# Patient Record
Sex: Female | Born: 1970 | Race: White | Hispanic: No | Marital: Single | Smoking: Never smoker
Health system: Southern US, Community
[De-identification: ages and names within clinical notes are randomized; demographics above are authoritative.]

## PROBLEM LIST (undated history)

## (undated) DIAGNOSIS — F419 Anxiety disorder, unspecified: Secondary | ICD-10-CM

## (undated) DIAGNOSIS — E119 Type 2 diabetes mellitus without complications: Secondary | ICD-10-CM

## (undated) DIAGNOSIS — I10 Essential (primary) hypertension: Secondary | ICD-10-CM

## (undated) DIAGNOSIS — F319 Bipolar disorder, unspecified: Secondary | ICD-10-CM

## (undated) DIAGNOSIS — T7840XA Allergy, unspecified, initial encounter: Secondary | ICD-10-CM

## (undated) HISTORY — DX: Allergy, unspecified, initial encounter: T78.40XA

## (undated) HISTORY — DX: Anxiety disorder, unspecified: F41.9

## (undated) HISTORY — DX: Essential (primary) hypertension: I10

---

## 2005-04-13 ENCOUNTER — Ambulatory Visit: Payer: Self-pay | Admitting: Internal Medicine

## 2005-05-23 ENCOUNTER — Ambulatory Visit: Payer: Self-pay | Admitting: Family Medicine

## 2005-06-13 ENCOUNTER — Ambulatory Visit: Payer: Self-pay | Admitting: Internal Medicine

## 2005-06-16 ENCOUNTER — Ambulatory Visit: Payer: Self-pay | Admitting: *Deleted

## 2005-06-21 ENCOUNTER — Ambulatory Visit: Payer: Self-pay | Admitting: Internal Medicine

## 2005-07-04 ENCOUNTER — Ambulatory Visit: Payer: Self-pay | Admitting: Internal Medicine

## 2005-07-12 ENCOUNTER — Ambulatory Visit: Payer: Self-pay | Admitting: Internal Medicine

## 2005-07-27 ENCOUNTER — Ambulatory Visit: Payer: Self-pay | Admitting: Internal Medicine

## 2005-08-24 ENCOUNTER — Ambulatory Visit: Payer: Self-pay | Admitting: Internal Medicine

## 2005-10-26 ENCOUNTER — Ambulatory Visit: Payer: Self-pay | Admitting: Internal Medicine

## 2005-11-11 ENCOUNTER — Ambulatory Visit: Payer: Self-pay | Admitting: Internal Medicine

## 2005-11-25 ENCOUNTER — Ambulatory Visit: Payer: Self-pay | Admitting: Internal Medicine

## 2006-01-04 ENCOUNTER — Ambulatory Visit: Payer: Self-pay | Admitting: Internal Medicine

## 2006-03-20 ENCOUNTER — Ambulatory Visit: Payer: Self-pay | Admitting: Internal Medicine

## 2006-04-19 ENCOUNTER — Ambulatory Visit: Payer: Self-pay | Admitting: Family Medicine

## 2006-04-28 ENCOUNTER — Ambulatory Visit: Payer: Self-pay | Admitting: Internal Medicine

## 2006-09-06 ENCOUNTER — Ambulatory Visit: Payer: Self-pay | Admitting: Internal Medicine

## 2006-12-30 ENCOUNTER — Emergency Department (HOSPITAL_COMMUNITY): Admission: EM | Admit: 2006-12-30 | Discharge: 2006-12-30 | Payer: Self-pay | Admitting: Emergency Medicine

## 2007-01-05 ENCOUNTER — Ambulatory Visit: Payer: Self-pay | Admitting: Internal Medicine

## 2007-04-03 ENCOUNTER — Ambulatory Visit: Payer: Self-pay | Admitting: Internal Medicine

## 2007-04-03 ENCOUNTER — Encounter: Payer: Self-pay | Admitting: Internal Medicine

## 2007-09-21 ENCOUNTER — Ambulatory Visit: Payer: Self-pay | Admitting: Internal Medicine

## 2008-03-27 ENCOUNTER — Ambulatory Visit: Payer: Self-pay | Admitting: Internal Medicine

## 2008-08-18 ENCOUNTER — Ambulatory Visit: Payer: Self-pay | Admitting: Internal Medicine

## 2008-09-11 ENCOUNTER — Ambulatory Visit: Payer: Self-pay | Admitting: Internal Medicine

## 2008-12-24 ENCOUNTER — Ambulatory Visit: Payer: Self-pay | Admitting: Internal Medicine

## 2008-12-24 LAB — CONVERTED CEMR LAB
Basophils Absolute: 0 10*3/uL (ref 0.0–0.1)
Basophils Relative: 0 % (ref 0–1)
CO2: 17 meq/L — ABNORMAL LOW (ref 19–32)
Chloride: 105 meq/L (ref 96–112)
Cholesterol: 194 mg/dL (ref 0–200)
HCT: 45.5 % (ref 36.0–46.0)
HDL: 56 mg/dL (ref >39)
Hemoglobin: 15 g/dL (ref 12.0–15.0)
Lymphocytes Relative: 24 % (ref 12–46)
MCHC: 33 g/dL (ref 30.0–36.0)
MCV: 92.5 fL (ref 78.0–100.0)
Neutro Abs: 4.8 10*3/uL (ref 1.7–7.7)
RBC: 4.92 M/uL (ref 3.87–5.11)
TSH: 0.949 microintl units/mL (ref 0.350–4.50)

## 2009-01-22 ENCOUNTER — Ambulatory Visit: Payer: Self-pay | Admitting: Internal Medicine

## 2009-03-05 ENCOUNTER — Ambulatory Visit: Payer: Self-pay | Admitting: Internal Medicine

## 2009-03-18 ENCOUNTER — Encounter: Admission: RE | Admit: 2009-03-18 | Discharge: 2009-03-18 | Payer: Self-pay | Admitting: Internal Medicine

## 2009-05-13 ENCOUNTER — Ambulatory Visit: Payer: Self-pay | Admitting: Internal Medicine

## 2009-06-03 ENCOUNTER — Ambulatory Visit: Payer: Self-pay | Admitting: Internal Medicine

## 2009-06-08 ENCOUNTER — Ambulatory Visit: Payer: Self-pay | Admitting: Family Medicine

## 2009-07-15 ENCOUNTER — Ambulatory Visit: Payer: Self-pay | Admitting: Internal Medicine

## 2009-07-16 ENCOUNTER — Encounter (INDEPENDENT_AMBULATORY_CARE_PROVIDER_SITE_OTHER): Payer: Self-pay | Admitting: Internal Medicine

## 2009-07-16 LAB — CONVERTED CEMR LAB
ALT: 44 units/L — ABNORMAL HIGH (ref 0–35)
AST: 30 units/L (ref 0–37)
BUN: 10 mg/dL (ref 6–23)
Basophils Absolute: 0 10*3/uL (ref 0.0–0.1)
Basophils Relative: 0 % (ref 0–1)
Calcium: 9.6 mg/dL (ref 8.4–10.5)
Chloride: 103 meq/L (ref 96–112)
Eosinophils Relative: 3 % (ref 0–5)
Hemoglobin: 14.5 g/dL (ref 12.0–15.0)
Neutro Abs: 3.7 10*3/uL (ref 1.7–7.7)
Potassium: 4 meq/L (ref 3.5–5.3)
Total Bilirubin: 0.4 mg/dL (ref 0.3–1.2)
Total Protein: 7.2 g/dL (ref 6.0–8.3)
WBC: 6 10*3/uL (ref 4.0–10.5)

## 2009-07-30 ENCOUNTER — Ambulatory Visit: Payer: Self-pay | Admitting: Internal Medicine

## 2009-09-16 ENCOUNTER — Ambulatory Visit: Payer: Self-pay | Admitting: Internal Medicine

## 2009-09-16 LAB — CONVERTED CEMR LAB
AST: 42 units/L — ABNORMAL HIGH (ref 0–37)
Albumin: 4.8 g/dL (ref 3.5–5.2)
Alkaline Phosphatase: 83 units/L (ref 39–117)
BUN: 11 mg/dL (ref 6–23)
Microalb, Ur: 31.93 mg/dL — ABNORMAL HIGH (ref 0.00–1.89)
Potassium: 3.5 meq/L (ref 3.5–5.3)
Sodium: 142 meq/L (ref 135–145)
Total Bilirubin: 0.5 mg/dL (ref 0.3–1.2)
Total Protein: 7.3 g/dL (ref 6.0–8.3)

## 2009-09-22 ENCOUNTER — Observation Stay (HOSPITAL_COMMUNITY): Admission: EM | Admit: 2009-09-22 | Discharge: 2009-09-24 | Payer: Self-pay | Admitting: Emergency Medicine

## 2009-09-22 ENCOUNTER — Ambulatory Visit: Payer: Self-pay | Admitting: Family Medicine

## 2009-09-30 ENCOUNTER — Ambulatory Visit: Payer: Self-pay | Admitting: Internal Medicine

## 2009-11-11 ENCOUNTER — Ambulatory Visit: Payer: Self-pay | Admitting: Internal Medicine

## 2009-11-11 LAB — CONVERTED CEMR LAB
Crystals: NONE SEEN
RBC / HPF: NONE SEEN (ref <3)
Urine Glucose: NEGATIVE mg/dL
Urobilinogen, UA: 0.2 (ref 0.0–1.0)
pH: 6 (ref 5.0–8.0)

## 2009-11-12 ENCOUNTER — Encounter (INDEPENDENT_AMBULATORY_CARE_PROVIDER_SITE_OTHER): Payer: Self-pay | Admitting: Internal Medicine

## 2010-02-01 ENCOUNTER — Ambulatory Visit: Payer: Self-pay | Admitting: Internal Medicine

## 2010-11-26 ENCOUNTER — Encounter (INDEPENDENT_AMBULATORY_CARE_PROVIDER_SITE_OTHER): Payer: Self-pay | Admitting: Family Medicine

## 2010-11-26 LAB — CONVERTED CEMR LAB
Alkaline Phosphatase: 71 units/L (ref 39–117)
BUN: 11 mg/dL (ref 6–23)
Basophils Relative: 0 % (ref 0–1)
CO2: 22 meq/L (ref 19–32)
Calcium: 9.3 mg/dL (ref 8.4–10.5)
Chloride: 105 meq/L (ref 96–112)
Eosinophils Absolute: 0.1 10*3/uL (ref 0.0–0.7)
Eosinophils Relative: 2 % (ref 0–5)
Glucose, Bld: 134 mg/dL — ABNORMAL HIGH (ref 70–99)
HDL: 51 mg/dL (ref >39)
Hemoglobin: 13.7 g/dL (ref 12.0–15.0)
LDL Cholesterol: 89 mg/dL (ref 0–99)
Lymphs Abs: 1.6 10*3/uL (ref 0.7–4.0)
MCHC: 33.7 g/dL (ref 30.0–36.0)
Neutro Abs: 4.1 10*3/uL (ref 1.7–7.7)
Potassium: 4.1 meq/L (ref 3.5–5.3)
Sodium: 140 meq/L (ref 135–145)
Total Bilirubin: 0.5 mg/dL (ref 0.3–1.2)
VLDL: 45 mg/dL — ABNORMAL HIGH (ref 0–40)

## 2011-02-09 LAB — COMPREHENSIVE METABOLIC PANEL
ALT: 39 U/L — ABNORMAL HIGH (ref 0–35)
Albumin: 3.4 g/dL — ABNORMAL LOW (ref 3.5–5.2)
Alkaline Phosphatase: 55 U/L (ref 39–117)
Alkaline Phosphatase: 63 U/L (ref 39–117)
BUN: 7 mg/dL (ref 6–23)
Calcium: 7.8 mg/dL — ABNORMAL LOW (ref 8.4–10.5)
Chloride: 107 mEq/L (ref 96–112)
GFR calc Af Amer: >60 mL/min (ref >60)
Glucose, Bld: 129 mg/dL — ABNORMAL HIGH (ref 70–99)
Potassium: 3.1 mEq/L — ABNORMAL LOW (ref 3.5–5.1)
Potassium: 3.1 mEq/L — ABNORMAL LOW (ref 3.5–5.1)
Sodium: 141 mEq/L (ref 135–145)
Total Bilirubin: 0.7 mg/dL (ref 0.3–1.2)
Total Protein: 6.3 g/dL (ref 6.0–8.3)

## 2011-02-09 LAB — GLUCOSE, CAPILLARY
Glucose-Capillary: 113 mg/dL — ABNORMAL HIGH (ref 70–99)
Glucose-Capillary: 117 mg/dL — ABNORMAL HIGH (ref 70–99)
Glucose-Capillary: 120 mg/dL — ABNORMAL HIGH (ref 70–99)
Glucose-Capillary: 138 mg/dL — ABNORMAL HIGH (ref 70–99)
Glucose-Capillary: 143 mg/dL — ABNORMAL HIGH (ref 70–99)
Glucose-Capillary: 152 mg/dL — ABNORMAL HIGH (ref 70–99)

## 2011-02-09 LAB — URINALYSIS, ROUTINE W REFLEX MICROSCOPIC
Glucose, UA: NEGATIVE mg/dL
Ketones, ur: NEGATIVE mg/dL
pH: 6 (ref 5.0–8.0)

## 2011-02-09 LAB — POCT I-STAT, CHEM 8
BUN: 8 mg/dL (ref 6–23)
Calcium, Ion: 0.93 mmol/L — ABNORMAL LOW (ref 1.12–1.32)
Chloride: 103 mEq/L (ref 96–112)
Creatinine, Ser: 0.5 mg/dL (ref 0.4–1.2)

## 2011-02-09 LAB — DIFFERENTIAL
Basophils Absolute: 0 10*3/uL (ref 0.0–0.1)
Basophils Relative: 0 % (ref 0–1)
Monocytes Absolute: 0.2 10*3/uL (ref 0.1–1.0)
Neutro Abs: 4.8 10*3/uL (ref 1.7–7.7)
Neutrophils Relative %: 85 % — ABNORMAL HIGH (ref 43–77)

## 2011-02-09 LAB — CULTURE, BLOOD (ROUTINE X 2)
Culture: NO GROWTH
Culture: NO GROWTH

## 2011-02-09 LAB — CBC
HCT: 37.8 % (ref 36.0–46.0)
Hemoglobin: 13.2 g/dL (ref 12.0–15.0)
MCHC: 35.4 g/dL (ref 30.0–36.0)
Platelets: 196 10*3/uL (ref 150–400)
RDW: 12.8 % (ref 11.5–15.5)
WBC: 5.7 10*3/uL (ref 4.0–10.5)

## 2011-02-09 LAB — URINE CULTURE: Colony Count: 35000

## 2011-02-09 LAB — LIPASE, BLOOD: Lipase: <10 U/L — ABNORMAL LOW (ref 11–59)

## 2011-02-09 LAB — AMYLASE: Amylase: 33 U/L (ref 27–131)

## 2011-02-17 ENCOUNTER — Emergency Department (HOSPITAL_COMMUNITY)
Admission: EM | Admit: 2011-02-17 | Discharge: 2011-02-18 | Disposition: A | Discharge status: Home / Self Care | Payer: Self-pay | Attending: Emergency Medicine | Admitting: Emergency Medicine

## 2011-02-17 DIAGNOSIS — R51 Headache: Secondary | ICD-10-CM | POA: Insufficient documentation

## 2011-02-17 DIAGNOSIS — R040 Epistaxis: Secondary | ICD-10-CM | POA: Insufficient documentation

## 2011-02-17 DIAGNOSIS — R42 Dizziness and giddiness: Secondary | ICD-10-CM | POA: Insufficient documentation

## 2011-02-17 DIAGNOSIS — F319 Bipolar disorder, unspecified: Secondary | ICD-10-CM | POA: Insufficient documentation

## 2011-02-17 DIAGNOSIS — I10 Essential (primary) hypertension: Secondary | ICD-10-CM | POA: Insufficient documentation

## 2011-02-17 DIAGNOSIS — E119 Type 2 diabetes mellitus without complications: Secondary | ICD-10-CM | POA: Insufficient documentation

## 2011-02-17 DIAGNOSIS — H538 Other visual disturbances: Secondary | ICD-10-CM | POA: Insufficient documentation

## 2011-02-17 DIAGNOSIS — R209 Unspecified disturbances of skin sensation: Secondary | ICD-10-CM | POA: Insufficient documentation

## 2011-02-17 LAB — GLUCOSE, CAPILLARY: Glucose-Capillary: 105 mg/dL — ABNORMAL HIGH (ref 70–99)

## 2012-08-16 ENCOUNTER — Ambulatory Visit: Payer: Self-pay | Admitting: Family Medicine

## 2012-09-18 ENCOUNTER — Encounter (HOSPITAL_COMMUNITY): Payer: Self-pay | Admitting: Emergency Medicine

## 2012-09-18 ENCOUNTER — Emergency Department (HOSPITAL_COMMUNITY)
Admission: EM | Admit: 2012-09-18 | Discharge: 2012-09-18 | Disposition: A | Discharge status: Home / Self Care | Payer: Self-pay | Attending: Emergency Medicine | Admitting: Emergency Medicine

## 2012-09-18 DIAGNOSIS — E119 Type 2 diabetes mellitus without complications: Secondary | ICD-10-CM | POA: Insufficient documentation

## 2012-09-18 DIAGNOSIS — F411 Generalized anxiety disorder: Secondary | ICD-10-CM | POA: Insufficient documentation

## 2012-09-18 DIAGNOSIS — R112 Nausea with vomiting, unspecified: Secondary | ICD-10-CM | POA: Insufficient documentation

## 2012-09-18 DIAGNOSIS — Z79899 Other long term (current) drug therapy: Secondary | ICD-10-CM | POA: Insufficient documentation

## 2012-09-18 DIAGNOSIS — R197 Diarrhea, unspecified: Secondary | ICD-10-CM | POA: Insufficient documentation

## 2012-09-18 DIAGNOSIS — F319 Bipolar disorder, unspecified: Secondary | ICD-10-CM | POA: Insufficient documentation

## 2012-09-18 HISTORY — DX: Bipolar disorder, unspecified: F31.9

## 2012-09-18 HISTORY — DX: Type 2 diabetes mellitus without complications: E11.9

## 2012-09-18 LAB — CBC WITH DIFFERENTIAL/PLATELET
Basophils Absolute: 0 K/uL (ref 0.0–0.1)
Basophils Relative: 0 % (ref 0–1)
Eosinophils Absolute: 0 K/uL (ref 0.0–0.7)
Eosinophils Relative: 1 % (ref 0–5)
HCT: 39 % (ref 36.0–46.0)
Hemoglobin: 13.7 g/dL (ref 12.0–15.0)
Lymphocytes Relative: 23 % (ref 12–46)
Lymphs Abs: 1.3 K/uL (ref 0.7–4.0)
MCH: 29.7 pg (ref 26.0–34.0)
MCHC: 35.1 g/dL (ref 30.0–36.0)
MCV: 84.4 fL (ref 78.0–100.0)
Monocytes Absolute: 0.4 K/uL (ref 0.1–1.0)
Monocytes Relative: 8 % (ref 3–12)
Neutro Abs: 3.8 K/uL (ref 1.7–7.7)
Neutrophils Relative %: 69 % (ref 43–77)
Platelets: 243 K/uL (ref 150–400)
RBC: 4.62 MIL/uL (ref 3.87–5.11)
RDW: 12.9 % (ref 11.5–15.5)
WBC: 5.5 K/uL (ref 4.0–10.5)

## 2012-09-18 LAB — HEPATIC FUNCTION PANEL
ALT: 26 U/L (ref 0–35)
AST: 26 U/L (ref 0–37)
Albumin: 4.3 g/dL (ref 3.5–5.2)
Bilirubin, Direct: <0.1 mg/dL (ref 0.0–0.3)
Total Protein: 7.7 g/dL (ref 6.0–8.3)

## 2012-09-18 LAB — URINALYSIS, ROUTINE W REFLEX MICROSCOPIC
Bilirubin Urine: NEGATIVE
Glucose, UA: NEGATIVE mg/dL
Ketones, ur: NEGATIVE mg/dL
Nitrite: NEGATIVE
Protein, ur: NEGATIVE mg/dL
Specific Gravity, Urine: 1.019 (ref 1.005–1.030)
Urobilinogen, UA: 0.2 mg/dL (ref 0.0–1.0)
pH: 6.5 (ref 5.0–8.0)

## 2012-09-18 LAB — URINE MICROSCOPIC-ADD ON

## 2012-09-18 LAB — BASIC METABOLIC PANEL WITH GFR
BUN: 8 mg/dL (ref 6–23)
CO2: 23 meq/L (ref 19–32)
Calcium: 9.6 mg/dL (ref 8.4–10.5)
Chloride: 103 meq/L (ref 96–112)
Creatinine, Ser: 0.52 mg/dL (ref 0.50–1.10)
GFR calc Af Amer: >90 mL/min (ref >90)
GFR calc non Af Amer: >90 mL/min (ref >90)
Glucose, Bld: 108 mg/dL — ABNORMAL HIGH (ref 70–99)
Potassium: 3.3 meq/L — ABNORMAL LOW (ref 3.5–5.1)
Sodium: 140 meq/L (ref 135–145)

## 2012-09-18 LAB — LIPASE, BLOOD: Lipase: 23 U/L (ref 11–59)

## 2012-09-18 MED ORDER — POTASSIUM CHLORIDE CRYS ER 20 MEQ PO TBCR
40.0000 meq | EXTENDED_RELEASE_TABLET | Freq: Once | ORAL | Status: AC
  Administered 2012-09-18: 40 meq via ORAL
  Filled 2012-09-18: qty 2

## 2012-09-18 MED ORDER — ONDANSETRON HCL 4 MG PO TABS: 4.0000 mg | ORAL_TABLET | Freq: Four times a day (QID) | ORAL | Status: DC

## 2012-09-18 NOTE — ED Notes (Signed)
Pt c/o abd pain with frequent diarrhea x months worse over last several days with some intermittent vomiting

## 2012-09-18 NOTE — ED Provider Notes (Signed)
History     CSN: [id number]  Arrival date & time 09/18/12  1155   First MD Initiated Contact with Patient 09/18/12 1511      Chief Complaint  Patient presents with  . Abdominal Pain  . Diarrhea    (Consider location/radiation/quality/duration/timing/severity/associated sxs/prior treatment) HPI Pt presents with c/o intermittent episodes of vomiting and diarrhea.  She states these symptoms have been off and on for the past several months.  Current episode started yesterday- had 2 episodes of emesis and multiple episodes of diarrhea.  No blood in stool.  No bile or blood in emesis.  She has some intermittent lower crampy abdominal pain which occurs while stooling.  No fever, no recent travel.  She currently states all her symptoms have improved, she states she was told to come to ED by her job because she couldn't come to work this morning.  She has had no recent medication changes.  No changes in diet.  No hx of abdominal surgery.  There are no other associated systemic symptoms, there are no other alleviating or modifying factors.   Past Medical History  Diagnosis Date  . Diabetes mellitus without complication   . Bipolar depression     History reviewed. No pertinent past surgical history.  History reviewed. No pertinent family history.  History  Substance Use Topics  . Smoking status: Never Smoker   . Smokeless tobacco: Not on file  . Alcohol Use: No    OB History    Grav Para Term Preterm Abortions TAB SAB Ect Mult Living                  Review of Systems ROS reviewed and all otherwise negative except for mentioned in HPI  Allergies  Penicillins  Home Medications   Current Outpatient Rx  Name  Route  Sig  Dispense  Refill  . OMEGA-3 FATTY ACIDS 1000 MG PO CAPS   Oral   Take 1 g by mouth 2 (two) times daily.         Marland Kitchen LORAZEPAM 0.5 MG PO TABS   Oral   Take 0.5 mg by mouth daily as needed. For anxiety         . METFORMIN HCL 500 MG PO TABS   Oral  Take 500 mg by mouth 2 (two) times daily with a meal.         . METOPROLOL SUCCINATE ER 50 MG PO TB24   Oral   Take 50 mg by mouth every evening. Take with or immediately following a meal.         . ALIVE WOMENS ENERGY PO   Oral   Take 1 tablet by mouth daily.         Marland Kitchen PALIPERIDONE ER 6 MG PO TB24   Oral   Take 12 mg by mouth at bedtime.         . SERTRALINE HCL 100 MG PO TABS   Oral   Take 150 mg by mouth daily.         . TRAZODONE HCL 100 MG PO TABS   Oral   Take 100 mg by mouth at bedtime as needed. For sleep         . VITAMIN E 400 UNITS PO CAPS   Oral   Take 400 Units by mouth daily.         Marland Kitchen ONDANSETRON HCL 4 MG PO TABS   Oral   Take 1 tablet (4 mg total) by mouth every  6 (six) hours.   12 tablet   0     BP 139/62  Pulse 97  Temp 98.1 F (36.7 C) (Oral)  Resp 16  SpO2 98% Vitals reviewed Physical Exam Physical Examination: General appearance - alert, well appearing, and in no distress Mental status - alert, oriented to person, place, and time Eyes - no scleral icterus, no conjunctival injection Mouth - mucous membranes moist, pharynx normal without lesions Chest - clear to auscultation, no wheezes, rales or rhonchi, symmetric air entry Heart - normal rate, regular rhythm, normal S1, S2, no murmurs, rubs, clicks or gallops Abdomen - soft, nontender, nondistended, no masses or organomegaly, nabs Extremities - peripheral pulses normal, no pedal edema, no clubbing or cyanosis Skin - normal coloration and turgor, no rashes  ED Course  Procedures (including critical care time)  Labs Reviewed  BASIC METABOLIC PANEL - Abnormal; Notable for the following:    Potassium 3.3 (*)     Glucose, Bld 108 (*)     All other components within normal limits  URINALYSIS, ROUTINE W REFLEX MICROSCOPIC - Abnormal; Notable for the following:    Hgb urine dipstick TRACE (*)     Leukocytes, UA SMALL (*)     All other components within normal limits  URINE  MICROSCOPIC-ADD ON - Abnormal; Notable for the following:    Squamous Epithelial / LPF FEW (*)     Bacteria, UA FEW (*)     All other components within normal limits  CBC WITH DIFFERENTIAL  LIPASE, BLOOD  HEPATIC FUNCTION PANEL  URINE CULTURE   No results found.   1. Nausea vomiting and diarrhea       MDM  Pt presents with intermittent vomiting and diarrhea.  Pt is asymptomatic in the ED.  Benign abdominal exam.  Labs are reassuring, mild hypokalemia- which was repleted.  Pt to be discharged with strict return precautions.  Discharged with strict return precautions.  Pt agreeable with plan.        Ethelda Chick, MD 09/18/12 [phone]

## 2012-09-19 LAB — URINE CULTURE

## 2012-09-21 ENCOUNTER — Ambulatory Visit: Payer: Self-pay | Admitting: Family Medicine

## 2012-09-21 ENCOUNTER — Encounter: Payer: Self-pay | Admitting: Family Medicine

## 2012-09-21 VITALS — BP 120/90 | HR 76 | Temp 98.0°F | Resp 16 | Ht 61.5 in | Wt 160.2 lb

## 2012-09-21 DIAGNOSIS — I10 Essential (primary) hypertension: Secondary | ICD-10-CM

## 2012-09-21 DIAGNOSIS — R197 Diarrhea, unspecified: Secondary | ICD-10-CM

## 2012-09-21 DIAGNOSIS — E876 Hypokalemia: Secondary | ICD-10-CM

## 2012-09-21 DIAGNOSIS — E119 Type 2 diabetes mellitus without complications: Secondary | ICD-10-CM

## 2012-09-21 MED ORDER — METFORMIN HCL 500 MG PO TABS: 500.0000 mg | ORAL_TABLET | Freq: Two times a day (BID) | ORAL | Status: DC

## 2012-09-21 MED ORDER — POTASSIUM CHLORIDE CRYS ER 20 MEQ PO TBCR: 20.0000 meq | EXTENDED_RELEASE_TABLET | Freq: Every day | ORAL | Status: DC

## 2012-09-21 NOTE — Patient Instructions (Addendum)
Diabetes medication change: take 1 Metformin tablet once a day with lunch. Continue to check your blood sugars especially if you feel like your sugar is dropping. I have ordered potassium to be taken once a day to get your level back up to normal. You can try to eat a cup of yogurt daily to help get your digestive tract back to normal. Try to eat some fruit and vegetables daily if you can.

## 2012-09-26 DIAGNOSIS — E119 Type 2 diabetes mellitus without complications: Secondary | ICD-10-CM | POA: Insufficient documentation

## 2012-09-26 DIAGNOSIS — E663 Overweight: Secondary | ICD-10-CM | POA: Insufficient documentation

## 2012-09-26 DIAGNOSIS — E8881 Metabolic syndrome: Secondary | ICD-10-CM | POA: Insufficient documentation

## 2012-09-26 DIAGNOSIS — I10 Essential (primary) hypertension: Secondary | ICD-10-CM | POA: Insufficient documentation

## 2012-09-26 NOTE — Progress Notes (Signed)
S: This 41 y.o. Cauc female is new to [facility]; she is here to establish care and to f/u after ED visit 3 days ago for n/v/d. Chronic medical problems include Type II DM, HTN, Hyperlipidemia and Depression/ Bipolar disorder.  Pt is feeling better; she reports hx of intermittent black-green stools and occasional n/v. She has been afebrile in last 48 hours. She c/o muscle cramps and weakness. ED labs revealed a low potassium. She received medical care at [facility] until their closure in August. Her last appt was March 2013 when A1c= 5.2%.   ROS: Negative for diaphoresis; unexplained weight loss or gain, fever/ chills, CP or tightness, palpitations, SOB or DOE, hematochezia or melena, GU problems, HA, numbness, tremor or syncope.  O:  Filed Vitals:   09/21/12 0901  BP: 120/90  Pulse: 76  Temp: 98 F (36.7 C)  Resp: 16   GEN: In NAD; WN,WD. HEENT: [place]/AT; EOMI w/ clear conj and nonicteric sclerae. EACs/TMs clear. Oropharynx moist and w/o exudate. NECK: Supple w/o LAN or TMG. COR: RRR. No edema. LUNGS: CTA w/o rales or rhonchi. ABD: Slightly distended/ obese; Normal BS. Nontender w/o guarding or rebound. No masses or HSM. NEURO: A&O x 3; Cns intact. Nonfocal.  A1c= 4.9%  A/P:   1. Type II or unspecified type diabetes mellitus without mention of complication, not stated as uncontrolled  Decrease Metformin to 1 tablet daily w/ largest meal (lunch)  2. Hypokalemia  RX: Potassium chloride 20 mEq  1 tablet daily.  3. Diarrhea  Resolving.  4. HTN (hypertension)

## 2012-10-19 ENCOUNTER — Ambulatory Visit: Payer: Self-pay | Admitting: Family Medicine

## 2012-11-02 ENCOUNTER — Encounter: Payer: Self-pay | Admitting: Family Medicine

## 2012-11-02 ENCOUNTER — Ambulatory Visit: Payer: Self-pay | Admitting: Family Medicine

## 2012-11-02 VITALS — BP 124/82 | HR 86 | Temp 97.5°F | Resp 16 | Ht 61.0 in | Wt 159.2 lb

## 2012-11-02 DIAGNOSIS — E876 Hypokalemia: Secondary | ICD-10-CM

## 2012-11-02 DIAGNOSIS — R197 Diarrhea, unspecified: Secondary | ICD-10-CM

## 2012-11-02 DIAGNOSIS — K521 Toxic gastroenteritis and colitis: Secondary | ICD-10-CM

## 2012-11-02 MED ORDER — METOPROLOL SUCCINATE ER 50 MG PO TB24: 50.0000 mg | ORAL_TABLET | Freq: Every evening | ORAL | Status: DC

## 2012-11-02 NOTE — Patient Instructions (Signed)

## 2012-11-02 NOTE — Progress Notes (Signed)
S:  Pt is here for f/u re: diarrhea thought to be due to Metformin. Reducing dose to once a day has been the solution; she had some loose stools last week associated w/ holidays treats but , overall, she feels much better. She is out of potassium tabs but has increased her intake of fruits and veggies. She denies any muscle cramping or weakness.  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  ROS: Negative for fatigue, fever/chills, CP or tightness, palpitations, n/v, dyspepsia, abd pain, BRBPR or melena, HA, dizziness, lightheadedness, tremor or syncope.  O:  Filed Vitals:   11/02/12 0918  BP: 124/82                                           Weight is down 1 lb.  Pulse: 86  Temp: 97.5 F (36.4 C)  Resp: 16   GEN: In NAD; WN,WD. HENT: [place]/AT; EOMI w/ clear conj/sclerae. Otherwise unremarkable. COR: RRR. No edema. LUNGS: Normal resp rate and eeffort. ABD: Normal BS; soft, no distention, not tender, no masses or HSM. NEURO: A&O x 3; CNs intact. Nonfocal.  A/P:  1. Diarrhea due to drug   Continue current dose of Metformin -1 tab daily (last A1c=4.9%)  2. Hypokalemia   Finish supplement; will recheck potassium at next visit. Increase K+ in diet.

## 2013-03-01 ENCOUNTER — Ambulatory Visit: Payer: Self-pay | Admitting: Family Medicine

## 2013-03-09 ENCOUNTER — Emergency Department (HOSPITAL_COMMUNITY)
Admission: EM | Admit: 2013-03-09 | Discharge: 2013-03-09 | Disposition: A | Discharge status: Home / Self Care | Payer: Self-pay | Attending: Emergency Medicine | Admitting: Emergency Medicine

## 2013-03-09 ENCOUNTER — Encounter (HOSPITAL_COMMUNITY): Payer: Self-pay | Admitting: Emergency Medicine

## 2013-03-09 ENCOUNTER — Telehealth: Payer: Self-pay

## 2013-03-09 DIAGNOSIS — Z880 Allergy status to penicillin: Secondary | ICD-10-CM | POA: Insufficient documentation

## 2013-03-09 DIAGNOSIS — R55 Syncope and collapse: Secondary | ICD-10-CM | POA: Insufficient documentation

## 2013-03-09 DIAGNOSIS — R5381 Other malaise: Secondary | ICD-10-CM | POA: Insufficient documentation

## 2013-03-09 DIAGNOSIS — I10 Essential (primary) hypertension: Secondary | ICD-10-CM | POA: Insufficient documentation

## 2013-03-09 DIAGNOSIS — E162 Hypoglycemia, unspecified: Secondary | ICD-10-CM

## 2013-03-09 DIAGNOSIS — F319 Bipolar disorder, unspecified: Secondary | ICD-10-CM | POA: Insufficient documentation

## 2013-03-09 DIAGNOSIS — F411 Generalized anxiety disorder: Secondary | ICD-10-CM | POA: Insufficient documentation

## 2013-03-09 DIAGNOSIS — Z79899 Other long term (current) drug therapy: Secondary | ICD-10-CM | POA: Insufficient documentation

## 2013-03-09 DIAGNOSIS — R5383 Other fatigue: Secondary | ICD-10-CM | POA: Insufficient documentation

## 2013-03-09 DIAGNOSIS — E1169 Type 2 diabetes mellitus with other specified complication: Secondary | ICD-10-CM | POA: Insufficient documentation

## 2013-03-09 LAB — CBC WITH DIFFERENTIAL/PLATELET
Eosinophils Relative: 1 % (ref 0–5)
Hemoglobin: 13.2 g/dL (ref 12.0–15.0)
Lymphocytes Relative: 25 % (ref 12–46)
Lymphs Abs: 1.7 10*3/uL (ref 0.7–4.0)
MCV: 83.8 fL (ref 78.0–100.0)
Monocytes Relative: 11 % (ref 3–12)
Neutrophils Relative %: 63 % (ref 43–77)
Platelets: 226 10*3/uL (ref 150–400)
RBC: 4.32 MIL/uL (ref 3.87–5.11)
WBC: 6.6 10*3/uL (ref 4.0–10.5)

## 2013-03-09 LAB — URINALYSIS, ROUTINE W REFLEX MICROSCOPIC
Glucose, UA: NEGATIVE mg/dL
Hgb urine dipstick: NEGATIVE
Leukocytes, UA: NEGATIVE
pH: 6.5 (ref 5.0–8.0)

## 2013-03-09 LAB — POCT I-STAT, CHEM 8
Creatinine, Ser: 0.6 mg/dL (ref 0.50–1.10)
Glucose, Bld: 87 mg/dL (ref 70–99)
Hemoglobin: 12.6 g/dL (ref 12.0–15.0)
Sodium: 140 mEq/L (ref 135–145)
TCO2: 24 mmol/L (ref 0–100)

## 2013-03-09 NOTE — ED Provider Notes (Signed)
History     CSN: [id number]  Arrival date & time 03/09/13  1325   First MD Initiated Contact with Patient 03/09/13 1341      Chief Complaint  Patient presents with  . Loss of Consciousness    (Consider location/radiation/quality/duration/timing/severity/associated sxs/prior treatment) HPI Comments: Pt states that she woke up this morning and she fell back on the bed and then when she woke up 30 minutes had past:pts states that she tried to get up a second time and it happened again and it was about 15 minutes later:pt states that she checked her sugar and it was 35 and she ate and drank something and it went up to 119:pt states that she is feeling better, just week:pt states that she felt week yesterday as well:pt has not been checking her bs because she is can't afford the strips:pt denies, cp,sob,fever, n/v/d  Patient is a 42 y.o. female presenting with syncope. The history is provided by the patient.  Loss of Consciousness  This is a new problem. The current episode started 1 to 2 hours ago. The problem occurs constantly. The problem has not changed since onset.Length of episode of loss of consciousness: unknown. Associated symptoms include weakness. Pertinent negatives include chest pain, confusion, nausea, visual change and vomiting. She has tried nothing for the symptoms.    Past Medical History  Diagnosis Date  . Diabetes mellitus without complication   . Bipolar depression   . Anxiety   . Hypertension   . Allergy     History reviewed. No pertinent past surgical history.  Family History  Problem Relation Age of Onset  . Arthritis Mother   . Hypertension Mother   . Heart disease Mother   . Arthritis Father     rheumatoid  . Hypertension Father   . Hypertension Sister     History  Substance Use Topics  . Smoking status: Never Smoker   . Smokeless tobacco: Not on file  . Alcohol Use: No    OB History   Grav Para Term Preterm Abortions TAB SAB Ect Mult Living                 Review of Systems  Respiratory: Negative.   Cardiovascular: Positive for syncope. Negative for chest pain.  Gastrointestinal: Negative for nausea and vomiting.  Neurological: Positive for weakness.  Psychiatric/Behavioral: Negative for confusion.    Allergies  Penicillins  Home Medications   Current Outpatient Rx  Name  Route  Sig  Dispense  Refill  . fish oil-omega-3 fatty acids 1000 MG capsule   Oral   Take 1 g by mouth 2 (two) times daily.         Marland Kitchen LORazepam (ATIVAN) 0.5 MG tablet   Oral   Take 0.5 mg by mouth daily as needed. For anxiety         . metFORMIN (GLUCOPHAGE) 500 MG tablet   Oral   Take 1 tablet (500 mg total) by mouth 2 (two) times daily with a meal.   60 tablet   2   . metoprolol succinate (TOPROL-XL) 50 MG 24 hr tablet   Oral   Take 1 tablet (50 mg total) by mouth every evening. Take with or immediately following a meal.   30 tablet   5   . Multiple Vitamins-Minerals (ALIVE WOMENS ENERGY PO)   Oral   Take 1 tablet by mouth daily.         . ondansetron (ZOFRAN) 4 MG tablet   Oral  Take 1 tablet (4 mg total) by mouth every 6 (six) hours.   12 tablet   0   . paliperidone (INVEGA) 6 MG 24 hr tablet   Oral   Take 12 mg by mouth at bedtime.         . sertraline (ZOLOFT) 100 MG tablet   Oral   Take 150 mg by mouth daily.         . traZODone (DESYREL) 100 MG tablet   Oral   Take 100 mg by mouth at bedtime as needed. For sleep         . vitamin E 400 UNIT capsule   Oral   Take 400 Units by mouth daily.           BP 145/74  Pulse 95  Temp(Src) 97.9 F (36.6 C) (Oral)  Resp 20  Ht 5\' 1"  (1.549 m)  Wt 162 lb (73.483 kg)  BMI 30.63 kg/m2  SpO2 96%  Physical Exam  Nursing note and vitals reviewed. Constitutional: She is oriented to person, place, and time. She appears well-developed and well-nourished.  HENT:  Head: Normocephalic and atraumatic.  Eyes: Conjunctivae and EOM are normal. Pupils are equal,  round, and reactive to light.  Neck: Neck supple.  Cardiovascular: Normal rate and regular rhythm.   Pulmonary/Chest: Effort normal and breath sounds normal.  Abdominal: Soft. Bowel sounds are normal.  Musculoskeletal: Normal range of motion.  Neurological: She is alert and oriented to person, place, and time. Coordination normal.  Skin: Skin is warm and dry.  Psychiatric: She has a normal mood and affect.    ED Course  Procedures (including critical care time)  Labs Reviewed  CBC WITH DIFFERENTIAL - Abnormal; Notable for the following:    MCHC 36.5 (*)    All other components within normal limits  GLUCOSE, CAPILLARY - Abnormal; Notable for the following:    Glucose-Capillary 138 (*)    All other components within normal limits  POCT I-STAT, CHEM 8 - Abnormal; Notable for the following:    Calcium, Ion 1.25 (*)    All other components within normal limits  GLUCOSE, CAPILLARY  URINALYSIS, ROUTINE W REFLEX MICROSCOPIC  GLUCOSE, CAPILLARY   No results found.   Date: 03/09/2013  Rate:81  Rhythm: normal sinus rhythm  QRS Axis: normal  Intervals: normal  ST/T Wave abnormalities: normal  Conduction Disutrbances:none  Narrative Interpretation:   Old EKG Reviewed: none available   1. Hypoglycemia       MDM  Pt is okay to follow up as needed:pt is holding blood sugar without any problem here:pt instructed on eating consistently and checking sugar        Teressa Lower, NP 03/09/13 1724

## 2013-03-09 NOTE — ED Notes (Signed)
CBG 97mg /dL Pa notified.

## 2013-03-09 NOTE — Telephone Encounter (Signed)
PATIENT STATES THAT HER BLOOD SUGAR IS LOW. STATES THAT SHE DOES NOT HAVE THE MONEY FOR AN OV AND WOULD LIKE SOME ADVICE OVER THE PHONE. PLEASE CALL AT: [phone]

## 2013-03-09 NOTE — ED Notes (Signed)
Patient presents to ED today with c/o syncopal episode twice today. Pt sts her cbg was 35 this am then pt ate and drank then cbg was 119.  CBG in triage 95.  Pt sts she felt weak today and yesterday. Pt reports she has had episodes over the past couple weeks in which she felt weak. Pt reports that today was the first time since December that she checked her sugar due to unable to afford test strips. NAD.

## 2013-03-09 NOTE — Telephone Encounter (Signed)
Pt needs OV or to proceed to emergency department.  Upon chart review, looks like pt has gone to ED

## 2013-03-09 NOTE — Telephone Encounter (Signed)
Called pt, she advised me that her BS was 35 this morning. She ate a taco and drank some juice. She hasn't checked it since then but doesn't have any money to come in. She has ten dollars to her name and states she has passed out this morning from her sugars being so low. I would say go to the emergency room. Please advise

## 2013-03-09 NOTE — ED Notes (Signed)
Pt provided with snacks and drinks

## 2013-03-10 NOTE — Telephone Encounter (Signed)
I did advise ED since the money was an issue for her to come here. Tried to call pt, no answer.

## 2013-03-10 NOTE — ED Provider Notes (Signed)
Medical screening examination/treatment/procedure(s) were performed by non-physician practitioner and as supervising physician I was immediately available for consultation/collaboration.  Tim Corriher R. Tallyn Holroyd, MD 03/10/13 1103 

## 2013-04-10 ENCOUNTER — Ambulatory Visit: Payer: Self-pay | Admitting: Family Medicine

## 2013-04-18 ENCOUNTER — Telehealth: Payer: Self-pay

## 2013-04-18 MED ORDER — METFORMIN HCL 500 MG PO TABS: 500.0000 mg | ORAL_TABLET | Freq: Every day | ORAL | Status: DC

## 2013-04-18 NOTE — Telephone Encounter (Signed)
Pt notified that rx sent in but needs to keep appt.

## 2013-04-18 NOTE — Telephone Encounter (Signed)
Pt. Had to reschedule her appt. Until next Thursday 6/19 w/ dr. Audria Nine. She would like to know if we can call in a rx for her metformin until then. She uses the [place] pharmacy on [address]  You can reach her at [phone]

## 2013-04-25 ENCOUNTER — Ambulatory Visit: Payer: Self-pay | Admitting: Family Medicine

## 2013-04-25 ENCOUNTER — Encounter: Payer: Self-pay | Admitting: Family Medicine

## 2013-04-25 VITALS — BP 117/72 | HR 75 | Temp 98.0°F | Resp 16 | Ht 61.5 in | Wt 163.0 lb

## 2013-04-25 DIAGNOSIS — E119 Type 2 diabetes mellitus without complications: Secondary | ICD-10-CM

## 2013-04-25 DIAGNOSIS — G43009 Migraine without aura, not intractable, without status migrainosus: Secondary | ICD-10-CM | POA: Insufficient documentation

## 2013-04-25 MED ORDER — METOPROLOL SUCCINATE ER 50 MG PO TB24: 50.0000 mg | ORAL_TABLET | Freq: Every evening | ORAL | Status: DC

## 2013-04-25 MED ORDER — METFORMIN HCL 500 MG PO TABS: 500.0000 mg | ORAL_TABLET | Freq: Every day | ORAL | Status: DC

## 2013-04-25 MED ORDER — BUTALBITAL-APAP-CAFFEINE 50-325-40 MG PO TABS: 1.0000 | ORAL_TABLET | Freq: Two times a day (BID) | ORAL | Status: DC | PRN

## 2013-04-25 NOTE — Progress Notes (Signed)
S:  This 42 y.o. Cauc female has Type II DM and admits medication compliance but poor nutrition choices in April. This resulted in ED eval for low blood sugar. Pt measured a value= 30-40 at home; she ate a snack and went to the ED. Once there, her blood sugar had started to recover. She checks FSBS every other day and values are now 90-102. Last vision eval was > 2 years ago; pt wears glasses.  Pt c/o occasional migraine HAs w/ aura and requests prescription for medication that she had years ago; "it has caffeine in it". She is averaging one HA per week. Her work place has some sections w/o air conditioning; she thinks she has some sinus problems related to this. She endorses sinus pressure in the upper cheeks. Her migraines are not accompanied by n/v, numbness or phonophobia.   Patient Active Problem List   Diagnosis Date Noted  . Headache, common migraine (occasionally w/ aura) 04/25/2013  . Type II or unspecified type diabetes mellitus without mention of complication, not stated as uncontrolled 09/26/2012  . HTN (hypertension) 09/26/2012  . Overweight (BMI 25.0-29.9) 09/26/2012  . Metabolic syndrome 09/26/2012  . Bipolar depression    PMHx, Soc Hx and Fam Hx reviewed.  ROS: As per HPI; negative for diaphoresis, fatigue, weight loss, anorexia, rhinorrhea or sore throat, itchy eyes, vision disturbances, CP or tightness, palpitations, SOB, GI problems, myalgias, muscle cramps, rashes, pallor, weakness  Or syncope.  O: Filed Vitals:   04/25/13 1542  BP: 117/72  Pulse: 75  Temp: 98 F (36.7 C)  Resp: 16   GEN: In NAD; WN,WD. HEENT: [place]/AT; EOMI w/ clear conj/ sclerae. EACs/TMs normal. Post ph clear and moist. Maxillary sinuses slightly tender w/ percussion. NECK: Supple w/o trigger points. No LAN or TMG. COR: RRR. No m/g/r. Distal pulses 2+/=. LUNGS: CTA; no wheezes or rales. SKIN: W&D. No erythema or pallor. MS: MAEs; FROM. No c/c/e. See DM foot exam. NEURO: A&O x 3; CNs intact.  Nonfocal.  Results for orders placed in visit on 04/25/13  POCT GLYCOSYLATED HEMOGLOBIN (HGB A1C)      Result Value Range   Hemoglobin A1C 4.9      A/P: Type II or unspecified type diabetes mellitus without mention of complication, not stated as uncontrolled - stable on current medication and treatment plan.   Plan: POCT glycosylated hemoglobin (Hb A1C), Microalbumin, urine  Headache, common migraine (occasionally w/ aura)- HA may be related to work environment; pt may try a decongestant to reduce sinus pressure.  Meds ordered this encounter  Medications  . metFORMIN (GLUCOPHAGE) 500 MG tablet    Sig: Take 1 tablet (500 mg total) by mouth daily.    Dispense:  30 tablet    Refill:  5  . metoprolol succinate (TOPROL-XL) 50 MG 24 hr tablet    Sig: Take 1 tablet (50 mg total) by mouth every evening. Take with or immediately following a meal.    Dispense:  30 tablet    Refill:  5  . butalbital-acetaminophen-caffeine (FIORICET, ESGIC) 50-325-40 MG per tablet    Sig: Take 1 tablet by mouth 2 (two) times daily as needed for headache.    Dispense:  20 tablet    Refill:  0

## 2013-04-25 NOTE — Patient Instructions (Addendum)
Your Diabetes is well controlled on current medications. Please schedule an eye exam within the next 3-4 months. I will see you in 5 months for complete physical and labs.

## 2013-05-03 ENCOUNTER — Ambulatory Visit: Payer: Self-pay | Admitting: Family Medicine

## 2013-06-26 ENCOUNTER — Emergency Department (HOSPITAL_COMMUNITY)
Admission: EM | Admit: 2013-06-26 | Discharge: 2013-06-26 | Disposition: A | Discharge status: Home / Self Care | Payer: Self-pay | Attending: Emergency Medicine | Admitting: Emergency Medicine

## 2013-06-26 ENCOUNTER — Encounter (HOSPITAL_COMMUNITY): Payer: Self-pay | Admitting: *Deleted

## 2013-06-26 ENCOUNTER — Emergency Department (HOSPITAL_COMMUNITY): Payer: Self-pay

## 2013-06-26 DIAGNOSIS — R0602 Shortness of breath: Secondary | ICD-10-CM | POA: Insufficient documentation

## 2013-06-26 DIAGNOSIS — E119 Type 2 diabetes mellitus without complications: Secondary | ICD-10-CM | POA: Insufficient documentation

## 2013-06-26 DIAGNOSIS — Z880 Allergy status to penicillin: Secondary | ICD-10-CM | POA: Insufficient documentation

## 2013-06-26 DIAGNOSIS — F319 Bipolar disorder, unspecified: Secondary | ICD-10-CM | POA: Insufficient documentation

## 2013-06-26 DIAGNOSIS — F411 Generalized anxiety disorder: Secondary | ICD-10-CM | POA: Insufficient documentation

## 2013-06-26 DIAGNOSIS — I10 Essential (primary) hypertension: Secondary | ICD-10-CM | POA: Insufficient documentation

## 2013-06-26 DIAGNOSIS — Z79899 Other long term (current) drug therapy: Secondary | ICD-10-CM | POA: Insufficient documentation

## 2013-06-26 DIAGNOSIS — R55 Syncope and collapse: Secondary | ICD-10-CM

## 2013-06-26 DIAGNOSIS — J329 Chronic sinusitis, unspecified: Secondary | ICD-10-CM | POA: Insufficient documentation

## 2013-06-26 DIAGNOSIS — R0789 Other chest pain: Secondary | ICD-10-CM | POA: Insufficient documentation

## 2013-06-26 LAB — URINALYSIS, ROUTINE W REFLEX MICROSCOPIC
Bilirubin Urine: NEGATIVE
Glucose, UA: NEGATIVE mg/dL
Ketones, ur: NEGATIVE mg/dL
pH: 6 (ref 5.0–8.0)

## 2013-06-26 LAB — CBC
HCT: 37.7 % (ref 36.0–46.0)
Hemoglobin: 13.2 g/dL (ref 12.0–15.0)
MCV: 86.3 fL (ref 78.0–100.0)
RBC: 4.37 MIL/uL (ref 3.87–5.11)
WBC: 6.6 10*3/uL (ref 4.0–10.5)

## 2013-06-26 LAB — BASIC METABOLIC PANEL
BUN: 7 mg/dL (ref 6–23)
CO2: 24 mEq/L (ref 19–32)
Chloride: 103 mEq/L (ref 96–112)
Creatinine, Ser: 0.55 mg/dL (ref 0.50–1.10)
Glucose, Bld: 95 mg/dL (ref 70–99)

## 2013-06-26 LAB — URINE MICROSCOPIC-ADD ON

## 2013-06-26 MED ORDER — SODIUM CHLORIDE 0.9 % IV BOLUS (SEPSIS)
1000.0000 mL | Freq: Once | INTRAVENOUS | Status: AC
  Administered 2013-06-26: 1000 mL via INTRAVENOUS

## 2013-06-26 NOTE — ED Notes (Signed)
Pt states she was getting ready for work and as she was putting on her socks she passed out for a few minutes. Pt states she also has some central chest tightness.

## 2013-06-26 NOTE — ED Provider Notes (Signed)
CSN: [id number]     Arrival date & time 06/26/13  1449 History     First MD Initiated Contact with Patient 06/26/13 1658     Chief Complaint  Patient presents with  . Loss of Consciousness  . Chest Pain   (Consider location/radiation/quality/duration/timing/severity/associated sxs/prior Treatment) HPI Comments: Patient presents emergency department with chief complaint of syncopal episode. She states that she "passed out this morning." She states that she was getting up from bed, when she felt dizzy and fell backward on the bed. She denies any head injury, or any pain anywhere. She states that she has passed out before secondary to the low blood sugar. She states that she has had some tightness in her chest since this morning. She also states that she is suffering from recurrent sinus infection. She has tried using nasal sprays for the sinus infection with some relief. She denies headache, fever, chills, nausea, or vomiting. She states that she does have chest tightness, as well as some shortness of breath, but denies any diaphoresis. She states that she feels will be hydrated, and states that she has been working in a hot environment for the past several days.  The history is provided by the patient. No language interpreter was used.    Past Medical History  Diagnosis Date  . Diabetes mellitus without complication   . Bipolar depression   . Anxiety   . Hypertension   . Allergy    History reviewed. No pertinent past surgical history. Family History  Problem Relation Age of Onset  . Arthritis Mother   . Hypertension Mother   . Heart disease Mother   . Arthritis Father     rheumatoid  . Hypertension Father   . Hypertension Sister    History  Substance Use Topics  . Smoking status: Never Smoker   . Smokeless tobacco: Not on file  . Alcohol Use: No   OB History   Grav Para Term Preterm Abortions TAB SAB Ect Mult Living                 Review of Systems  All other systems  reviewed and are negative.    Allergies  Penicillins  Home Medications   Current Outpatient Rx  Name  Route  Sig  Dispense  Refill  . butalbital-acetaminophen-caffeine (FIORICET, ESGIC) 50-325-40 MG per tablet   Oral   Take 1 tablet by mouth 2 (two) times daily as needed for headache.   20 tablet   0   . fish oil-omega-3 fatty acids 1000 MG capsule   Oral   Take 1 g by mouth 3 (three) times daily.          Marland Kitchen LORazepam (ATIVAN) 0.5 MG tablet   Oral   Take 0.5 mg by mouth daily as needed. For anxiety         . metFORMIN (GLUCOPHAGE) 500 MG tablet   Oral   Take 1 tablet (500 mg total) by mouth daily.   30 tablet   5   . metoprolol succinate (TOPROL-XL) 50 MG 24 hr tablet   Oral   Take 1 tablet (50 mg total) by mouth every evening. Take with or immediately following a meal.   30 tablet   5   . Multiple Vitamins-Minerals (ALIVE WOMENS ENERGY PO)   Oral   Take 1 tablet by mouth daily.         . ondansetron (ZOFRAN) 4 MG tablet   Oral   Take 1 tablet (4  mg total) by mouth every 6 (six) hours.   12 tablet   0   . paliperidone (INVEGA) 6 MG 24 hr tablet   Oral   Take 12 mg by mouth at bedtime.         . sertraline (ZOLOFT) 100 MG tablet   Oral   Take 150 mg by mouth daily.         . traZODone (DESYREL) 100 MG tablet   Oral   Take 100 mg by mouth at bedtime as needed. For sleep         . vitamin E 400 UNIT capsule   Oral   Take 400 Units by mouth daily.          BP 149/71  Pulse 92  Temp(Src) 98.1 F (36.7 C) (Oral)  Resp 18  SpO2 95%  LMP 06/19/2013 Physical Exam  Nursing note and vitals reviewed. Constitutional: She is oriented to person, place, and time. She appears well-developed and well-nourished.  HENT:  Head: Normocephalic and atraumatic.  Eyes: Conjunctivae and EOM are normal. Pupils are equal, round, and reactive to light.  Neck: Normal range of motion. Neck supple.  Cardiovascular: Normal rate and regular rhythm.  Exam  reveals no gallop and no friction rub.   No murmur heard. Pulmonary/Chest: Effort normal and breath sounds normal. No respiratory distress. She has no wheezes. She has no rales. She exhibits no tenderness.  Abdominal: Soft. Bowel sounds are normal. She exhibits no distension and no mass. There is no tenderness. There is no rebound and no guarding.  Musculoskeletal: Normal range of motion. She exhibits no edema and no tenderness.  Neurological: She is alert and oriented to person, place, and time.  Skin: Skin is warm and dry.  Psychiatric: She has a normal mood and affect. Her behavior is normal. Judgment and thought content normal.    ED Course   Procedures (including critical care time)  Labs Reviewed  CBC  BASIC METABOLIC PANEL  URINALYSIS, ROUTINE W REFLEX MICROSCOPIC   Dg Chest 2 View  06/26/2013   *RADIOLOGY REPORT*  Clinical Data: Loss of consciousness  CHEST - 2 VIEW  Comparison: 12/30/2006  Findings: Cardiomediastinal silhouette is stable.  No acute infiltrate or pleural effusion.  No pulmonary edema.  Mild degenerative changes lower thoracic spine.  IMPRESSION: .  No active disease.  Mild degenerative changes lower thoracic spine.   Original Report Authenticated By: Natasha Mead, M.D.   1. Syncope     MDM  Patient with syncopal episode this morning, there is no injuries. Will check basic labs, EKG, chest x-ray. Will also check a UA. And will reevaluate.  Labs and workup reviewed by Dr. Ranae Palms, and are unremarkable.  7:02 PM Will give additional liter of fluid then discharge to home with PCP follow-up. Discussed the patient with Dr. Ranae Palms, who agrees with the plan.  7:56 PM Patient finishing fluids, and then will be discharged to home.  Dr. Ranae Palms agrees with the workup and discharge plan.  Roxy Horseman, PA-C 06/26/13 1956

## 2013-06-26 NOTE — ED Notes (Signed)
Pt ambulated to restroom without difficulty

## 2013-06-26 NOTE — ED Notes (Signed)
Pt reports having recent sinus infection. Having chest tightness since this am, denies any sob or n/v. Also reports +syncopal episode this am. ekg done at triage.

## 2013-06-26 NOTE — ED Provider Notes (Signed)
Discussed case with Roxy Horseman, PA-C. Transfer of care from [employer], PA-C.  Instructed by Roxy Horseman, PA-C, that once patient is to finish fluids, ambulate and see how she does. If okay discharge home with instructions.  Elizabeth [name] is a 42 y/o F with PMHx of Bipolar depression, DM without complication, anxiety and HTN presenting to the ED after a syncopal episode that occurred this morning with chest tightness and shortness of breath.  PCP Dr. Dory Horn Urine negative for infection. CBC and BMP negative findings. Chest xray no active disease noted. Troponins negative. EKG negative ischemic findings noted.     9:13PM Discussed lab findings with patient in detail, all questions answered. Patient alert and oriented. Patient ambulated well from the bathroom back to her room without difficulty and dizziness. Gait proper - negative sway, negative limp noted - back straight. Lungs clear to auscultation bilaterally to upper and lower lobes. Heart rate and rhythm normal. Pulses palpable 2+, radial and DP bilaterally. Strength intact to upper and lower extremities bilaterally, equal. Denied chest tightness, shortness of breath, difficulty breathing, nausea, dizziness. Patient stable, afebrile. Discharged patient. Discussed with patient to rest and stay hydrated. Discussed with patient to closely monitor symptoms and if symptoms are to worsen or change (chest pain, shortness of breath, difficulty breathing, abdominal pain, nausea, vomiting, dizziness, fainting, LOC, weakness, fatigue) to report back to the ED - strict return instructions given. Patient agreed to plan of care, understood, all questions answered.   Raymon Mutton, PA-C 06/26/13 2202

## 2013-06-26 NOTE — ED Notes (Signed)
Ambulatory at triage

## 2013-06-26 NOTE — ED Notes (Signed)
Discharge and follow up instructions reviewed with pt. Pt verbalized understanding.  

## 2013-06-27 NOTE — ED Provider Notes (Signed)
Medical screening examination/treatment/procedure(s) were performed by non-physician practitioner and as supervising physician I was immediately available for consultation/collaboration.   Bruce Mayers, MD 06/27/13 1659 

## 2013-07-01 NOTE — ED Provider Notes (Signed)
Medical screening examination/treatment/procedure(s) were performed by non-physician practitioner and as supervising physician I was immediately available for consultation/collaboration.    [place] Latulippe D Otillia Cordone, MD 07/01/13 0703 

## 2013-07-04 ENCOUNTER — Ambulatory Visit: Payer: Self-pay | Admitting: Family Medicine

## 2013-07-04 ENCOUNTER — Encounter: Payer: Self-pay | Admitting: Family Medicine

## 2013-07-04 VITALS — BP 152/88 | HR 89 | Temp 98.8°F | Resp 16 | Ht 61.5 in | Wt 170.4 lb

## 2013-07-04 DIAGNOSIS — Z87898 Personal history of other specified conditions: Secondary | ICD-10-CM

## 2013-07-04 DIAGNOSIS — Z9189 Other specified personal risk factors, not elsewhere classified: Secondary | ICD-10-CM

## 2013-07-04 DIAGNOSIS — E860 Dehydration: Secondary | ICD-10-CM

## 2013-07-04 NOTE — Patient Instructions (Signed)
It is important that you maintain adequate hydration everyday; I recommend you keep a water bottle nearby so that you can drink at least 64 ounces daily. I have provided a letter for you to take to your supervisor stating this necessity.

## 2013-07-04 NOTE — Progress Notes (Signed)
S:  This 42 y.o. Cauc female was recently eval in [facility] ED after a syncopal episode; she was found to be dehydrated. She has returned to work and is trying to maintain good hydration. This is difficult because they are not allowed to have water bottles in their personal work space. She works in a department store and the [facility] has not been functioning. Upon the pt's return to work, the [facility] was repaired and her work environment is a little cooler. Pt is sleeping fairly well given that she has bipolar disorder; she gets 4- 6.5 hours of sleep most nights. She is complaint w/ all medications and is having no adverse effects.  Patient Active Problem List   Diagnosis Date Noted  . Headache, common migraine (occasionally w/ aura) 04/25/2013  . Type II or unspecified type diabetes mellitus without mention of complication, not stated as uncontrolled 09/26/2012  . HTN (hypertension) 09/26/2012  . Overweight (BMI 25.0-29.9) 09/26/2012  . Metabolic syndrome 09/26/2012  . Bipolar depression    PMHx, Soc Hx and Medications reviewed and reconciled.  ROS: Pt still feels fatigued; negative for diaphoresis, CP or tightness, palpitations, SOB or DOE, abd pain, myalgias, HA, dizziness, weakness, numbness, tremor or repeat syncope.  O:  Filed Vitals:   07/04/13 0920  BP: 152/88  Pulse: 89  Temp: 98.8 F (37.1 C)  Resp: 16   GEN: In NAD; WN,WD. HENT: [place]/AT; EOMI w/ clear conj/sclerae. EACs/TMs normal. Nasal mucosa/oroph clear and moist. Sinuses- NT w/ percussion. COR: RRR. Normal S1 and S2. No m/g/r. LUNGS: CTA; no wheezes or rales. Normal resp rate and effort. SKIN: W&D; no rashes or pallor. NEURO: A&O x 3; Cns intact. Sensory /motor function intact. Nonfocal.  A/P: Dehydration- Hydration status appears normal. Encouraged daily adequate fluid intake; letter given to pt to give to supervisor to allow water bottle at work station.  History of syncope

## 2013-09-25 ENCOUNTER — Ambulatory Visit: Payer: Self-pay | Admitting: Family Medicine

## 2013-11-18 ENCOUNTER — Telehealth: Payer: Self-pay

## 2013-11-18 NOTE — Telephone Encounter (Signed)
Pts states that she is taking a PE class and she was instructed to complete a health questionnaire and if she answered yes to any of those questions she would need documentation from the doctor stating that she is able to participate in PE activities. The questions she answered yes to were for the following dizzness/unconciusness and blood pressure.

## 2013-11-19 NOTE — Telephone Encounter (Signed)
If pt has a form for clearance that I can sign, I would be glad to do that for her. The form needs to specify what type of exercises she is doing. Thank you.

## 2013-11-20 ENCOUNTER — Encounter: Payer: Self-pay | Admitting: Family Medicine

## 2013-11-22 NOTE — Telephone Encounter (Signed)
Pt brought form in earlier this week and it has been completed.

## 2014-01-03 ENCOUNTER — Ambulatory Visit: Payer: Self-pay | Admitting: Family Medicine

## 2014-01-07 ENCOUNTER — Other Ambulatory Visit: Payer: Self-pay

## 2014-01-07 MED ORDER — METOPROLOL SUCCINATE ER 50 MG PO TB24: 50.0000 mg | ORAL_TABLET | Freq: Every evening | ORAL | Status: DC

## 2014-01-17 ENCOUNTER — Ambulatory Visit: Payer: Self-pay | Admitting: Family Medicine

## 2014-01-21 ENCOUNTER — Ambulatory Visit: Payer: Self-pay | Admitting: Family Medicine

## 2014-01-24 ENCOUNTER — Encounter: Payer: Self-pay | Admitting: Family Medicine

## 2014-01-24 ENCOUNTER — Ambulatory Visit: Payer: Self-pay | Admitting: Family Medicine

## 2014-01-24 VITALS — BP 150/96 | HR 110 | Temp 98.9°F | Resp 16 | Ht 61.75 in | Wt 179.0 lb

## 2014-01-24 DIAGNOSIS — E119 Type 2 diabetes mellitus without complications: Secondary | ICD-10-CM

## 2014-01-24 DIAGNOSIS — I10 Essential (primary) hypertension: Secondary | ICD-10-CM

## 2014-01-24 DIAGNOSIS — J0100 Acute maxillary sinusitis, unspecified: Secondary | ICD-10-CM

## 2014-01-24 LAB — COMPLETE METABOLIC PANEL WITH GFR
ALT: 21 U/L (ref 0–35)
AST: 12 U/L (ref 0–37)
Albumin: 4.4 g/dL (ref 3.5–5.2)
Alkaline Phosphatase: 73 U/L (ref 39–117)
BUN: 7 mg/dL (ref 6–23)
CALCIUM: 8.9 mg/dL (ref 8.4–10.5)
CHLORIDE: 106 meq/L (ref 96–112)
CO2: 21 mEq/L (ref 19–32)
CREATININE: 0.46 mg/dL — AB (ref 0.50–1.10)
GFR, Est African American: >89 mL/min
GLUCOSE: 159 mg/dL — AB (ref 70–99)
Potassium: 3.8 mEq/L (ref 3.5–5.3)
Sodium: 137 mEq/L (ref 135–145)
Total Bilirubin: 0.5 mg/dL (ref 0.2–1.2)
Total Protein: 7.3 g/dL (ref 6.0–8.3)

## 2014-01-24 LAB — LIPID PANEL
CHOL/HDL RATIO: 3.9 ratio
CHOLESTEROL: 193 mg/dL (ref 0–200)
HDL: 50 mg/dL (ref >39)
LDL Cholesterol: 105 mg/dL — ABNORMAL HIGH (ref 0–99)
TRIGLYCERIDES: 189 mg/dL — AB (ref <150)
VLDL: 38 mg/dL (ref 0–40)

## 2014-01-24 LAB — POCT GLYCOSYLATED HEMOGLOBIN (HGB A1C): HEMOGLOBIN A1C: 5.8

## 2014-01-24 MED ORDER — METFORMIN HCL 500 MG PO TABS: 500.0000 mg | ORAL_TABLET | Freq: Every day | ORAL | Status: DC

## 2014-01-24 MED ORDER — DOXYCYCLINE HYCLATE 100 MG PO TABS: 100.0000 mg | ORAL_TABLET | Freq: Two times a day (BID) | ORAL | Status: DC

## 2014-01-24 MED ORDER — METOPROLOL SUCCINATE ER 50 MG PO TB24: 50.0000 mg | ORAL_TABLET | Freq: Every evening | ORAL | Status: DC

## 2014-01-24 NOTE — Patient Instructions (Signed)
Sinusitis Sinusitis is redness, soreness, and puffiness (inflammation) of the air pockets in the bones of your face (sinuses). The redness, soreness, and puffiness can cause air and mucus to get trapped in your sinuses. This can allow germs to grow and cause an infection.  HOME CARE   Drink enough fluids to keep your pee (urine) clear or pale yellow.  Use a humidifier in your home.  Run a hot shower to create steam in the bathroom. Sit in the bathroom with the door closed. Breathe in the steam 3 4 times a day.  Put a warm, moist washcloth on your face 3 4 times a day, or as told by your doctor.  Use salt water sprays (saline sprays) to wet the thick fluid in your nose. This can help the sinuses drain.  Only take medicine as told by your doctor. GET HELP RIGHT AWAY IF:   Your pain gets worse.  You have very bad headaches.  You are sick to your stomach (nauseous).  You throw up (vomit).  You are very sleepy (drowsy) all the time.  Your face is puffy (swollen).  Your vision changes.  You have a stiff neck.  You have trouble breathing. MAKE SURE YOU:   Understand these instructions.  Will watch your condition.  Will get help right away if you are not doing well or get worse. Document Released: 04/11/2008 Document Revised: 07/18/2012 Document Reviewed: 05/29/2012 Exit[employer] Patient Information 2014 [employer].  

## 2014-01-25 NOTE — Progress Notes (Signed)
Quick Note:  Please advise pt regarding following labs... Results look good; triglycerides and LDL ("bad') cholesterol need to be lower. Improved nutrition and staying active can help lower these numbers. They are much better compared to 5 years ago. HDL ("good") cholesterol is holding steady in the 50s!  Copy to pt.  ______

## 2014-01-26 ENCOUNTER — Encounter: Payer: Self-pay | Admitting: Family Medicine

## 2014-01-26 NOTE — Progress Notes (Signed)
S:  This 43 y.o. Cauc female is here for follow-up of chronic conditions as well evaluation of an acute illness.  Type II DM- Compliant w/ medication w/o adverse effect. FSBS checked twice a week; range 90-120. No hypoglycemia or vision changes. Attends fitness class for 15 minutes 2x/week and walks on the weekends. Waist circumference measured elsewhere= 40 inches.  HTN- stable and controlled w/o fatigue, diaphoresis, CP or tightness, palpitations, SOB or DOE, cough, edema, HA, dizziness, numbness, weakness or syncope.  Pt c/o fever, sore throat,aches and chills for 3 days. She has sinus pain and congestion (R>L) and NP cough. She feels better today, has been taking OTC products for symptoms. 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 No n/v/d or rash.  Patient Active Problem List   Diagnosis Date Noted  . Headache, common migraine (occasionally w/ aura) 04/25/2013  . Type II or unspecified type diabetes mellitus without mention of complication, not stated as uncontrolled 09/26/2012  . HTN (hypertension) 09/26/2012  . Overweight (BMI 25.0-29.9) 09/26/2012  . Metabolic syndrome 09/26/2012  . Bipolar depression    Prior to Admission medications   Medication Sig Start Date End Date Taking? Authorizing Provider  fish oil-omega-3 fatty acids 1000 MG capsule Take 1 g by mouth 3 (three) times daily.    Yes Historical Provider, MD  LORazepam (ATIVAN) 0.5 MG tablet Take 0.5 mg by mouth daily as needed. For anxiety   Yes Historical Provider, MD  metFORMIN (GLUCOPHAGE) 500 MG tablet Take 1 tablet (500 mg total) by mouth daily.   Yes Maurice March, MD  metoprolol succinate (TOPROL-XL) 50 MG 24 hr tablet Take 1 tablet (50 mg total) by mouth every evening.   Yes Maurice March, MD  paliperidone (INVEGA) 6 MG 24 hr tablet Take 12 mg by mouth at bedtime.   Yes Historical Provider, MD  sertraline (ZOLOFT) 100 MG tablet Take 150 mg by mouth daily.   Yes Historical Provider, MD   vitamin E 400 UNIT capsule Take 400 Units by mouth daily.   Yes Historical Provider, MD  butalbital-acetaminophen-caffeine (FIORICET, ESGIC) [phone] MG per tablet Take 1 tablet by mouth 2 (two) times daily as needed for headache. 04/25/13   Maurice March, MD  CINNAMON PO Take 1 tablet by mouth 2 (two) times daily.    Historical Provider, MD         Multiple Vitamins-Minerals (ALIVE WOMENS ENERGY PO) Take 1 tablet by mouth daily.    Historical Provider, MD  Probiotic Product (PROBIOTIC DAILY PO) Take 1 tablet by mouth daily.    Historical Provider, MD  traZODone (DESYREL) 100 MG tablet Take 100 mg by mouth at bedtime as needed. For sleep    Historical Provider, MD   PMHx, Surg Hx, Soc and Fam Hx reviewed.  ROS; As per HPI.  O: Filed Vitals:   01/24/14 1023  BP: 150/96  Pulse: 110  Temp: 98.9 F (37.2 C)  Resp: 16   GEN: In NAD; WN,WD. HENT: [place]/AT; EOMI w/ clear conj/sclerae. EACs normal; TMs- scant effusion and injection. Nasal mucosa- pale and boggy. Post ph erythematous w/o lesions or exudate. R maxillary sinus tender. NECK: Supple w/o LAN or TMG. COR: RRR. Normal S1 and S2. No m/g/r. LUNGS: CTA; no wheezes or rhonchi. Normal resp rate and effort. SKIN: W&D; intact w/o diaphoresis, rash or pallor. NEURO: A&O x 3; CNs intact. Nonfocal.  A1c= 5.8 %  A/P: Sinusitis, acute, maxillary- RX: Doxycycline 100 mg 1 tablet bid.  OTC AYR saline nasal spray.  Type II or unspecified type diabetes mellitus without mention of complication, not stated as uncontrolled - Well controlled on current medication- Metformin 500 mg 1 tab daily w/ largest meal. Continue same as well as TLCs. Plan: [facility] Diabetes Foot Exam, POCT glycosylated hemoglobin (Hb A1C), Lipid panel, COMPLETE METABOLIC PANEL WITH GFR  HTN (hypertension) - Stable; continue Metoprolol succinate 50 mg 24 hr tablet once a day.    Plan: Lipid panel, COMPLETE METABOLIC PANEL WITH GFR  Meds ordered this encounter  Medications   . doxycycline (VIBRA-TABS) 100 MG tablet    Sig: Take 1 tablet (100 mg total) by mouth 2 (two) times daily.    Dispense:  20 tablet    Refill:  0  . metFORMIN (GLUCOPHAGE) 500 MG tablet    Sig: Take 1 tablet (500 mg total) by mouth daily.    Dispense:  30 tablet    Refill:  5  . metoprolol succinate (TOPROL-XL) 50 MG 24 hr tablet    Sig: Take 1 tablet (50 mg total) by mouth every evening.    Dispense:  30 tablet    Refill:  5

## 2014-02-13 ENCOUNTER — Emergency Department (HOSPITAL_COMMUNITY)
Admission: EM | Admit: 2014-02-13 | Discharge: 2014-02-13 | Disposition: A | Discharge status: Home / Self Care | Payer: Self-pay | Attending: Emergency Medicine | Admitting: Emergency Medicine

## 2014-02-13 ENCOUNTER — Emergency Department (HOSPITAL_COMMUNITY): Payer: Self-pay

## 2014-02-13 ENCOUNTER — Encounter (HOSPITAL_COMMUNITY): Payer: Self-pay | Admitting: Emergency Medicine

## 2014-02-13 DIAGNOSIS — I10 Essential (primary) hypertension: Secondary | ICD-10-CM | POA: Insufficient documentation

## 2014-02-13 DIAGNOSIS — Z79899 Other long term (current) drug therapy: Secondary | ICD-10-CM | POA: Insufficient documentation

## 2014-02-13 DIAGNOSIS — Y929 Unspecified place or not applicable: Secondary | ICD-10-CM | POA: Insufficient documentation

## 2014-02-13 DIAGNOSIS — Z791 Long term (current) use of non-steroidal anti-inflammatories (NSAID): Secondary | ICD-10-CM | POA: Insufficient documentation

## 2014-02-13 DIAGNOSIS — F411 Generalized anxiety disorder: Secondary | ICD-10-CM | POA: Insufficient documentation

## 2014-02-13 DIAGNOSIS — Y9389 Activity, other specified: Secondary | ICD-10-CM | POA: Insufficient documentation

## 2014-02-13 DIAGNOSIS — E119 Type 2 diabetes mellitus without complications: Secondary | ICD-10-CM | POA: Insufficient documentation

## 2014-02-13 DIAGNOSIS — X500XXA Overexertion from strenuous movement or load, initial encounter: Secondary | ICD-10-CM | POA: Insufficient documentation

## 2014-02-13 DIAGNOSIS — S93402A Sprain of unspecified ligament of left ankle, initial encounter: Secondary | ICD-10-CM

## 2014-02-13 DIAGNOSIS — Z880 Allergy status to penicillin: Secondary | ICD-10-CM | POA: Insufficient documentation

## 2014-02-13 DIAGNOSIS — S93409A Sprain of unspecified ligament of unspecified ankle, initial encounter: Secondary | ICD-10-CM | POA: Insufficient documentation

## 2014-02-13 DIAGNOSIS — F3130 Bipolar disorder, current episode depressed, mild or moderate severity, unspecified: Secondary | ICD-10-CM | POA: Insufficient documentation

## 2014-02-13 MED ORDER — MELOXICAM 7.5 MG PO TABS: 15.0000 mg | ORAL_TABLET | Freq: Every day | ORAL | Status: DC

## 2014-02-13 MED ORDER — TRAMADOL HCL 50 MG PO TABS: 50.0000 mg | ORAL_TABLET | Freq: Four times a day (QID) | ORAL | Status: DC | PRN

## 2014-02-13 NOTE — ED Provider Notes (Signed)
CSN: [id number]     Arrival date & time 02/13/14  1413 History  This chart was scribed for Junius Finner, PA-C, working with Flint Melter, MD by Blanchard Kelch, ED Scribe. This patient was seen in room TR05C/TR05C and the patient's care was started at 3:28 PM.     Chief Complaint  Patient presents with  . Ankle Pain  . Foot Pain     Patient is a 43 y.o. female presenting with ankle pain and lower extremity pain. The history is provided by the patient. No language interpreter was used.  Ankle Pain Foot Pain    HPI Comments: Elizabeth [name] is a 43 y.o. female who presents to the Emergency Department complaining of a left foot and ankle injury that occurred yesterday. She states that she was exercising and lost her balance, causing her left foot to twist and cause immediate pain. She reports constant, improving pain to her foot and ankle that she rates as a 8/10 in severity at its max but a 6/10 currently. The pain is worsened by bearing weight but she is able to ambulate. She has been icing the foot with mild relief. She reports a history of injuring the ankle in the past.   Past Medical History  Diagnosis Date  . Diabetes mellitus without complication   . Bipolar depression   . Anxiety   . Hypertension   . Allergy    History reviewed. No pertinent past surgical history. Family History  Problem Relation Age of Onset  . Arthritis Mother   . Hypertension Mother   . Heart disease Mother   . Arthritis Father     rheumatoid  . Hypertension Father   . Hypertension Sister   . Thyroid disease Sister   . Congestive Heart Failure Maternal Grandfather   . Cancer Paternal Grandmother   . Heart disease Paternal Grandfather    History  Substance Use Topics  . Smoking status: Never Smoker   . Smokeless tobacco: Not on file  . Alcohol Use: No   OB History   Grav Para Term Preterm Abortions TAB SAB Ect Mult Living                 Review of Systems  Musculoskeletal: Positive for  arthralgias and myalgias. Negative for gait problem.  All other systems reviewed and are negative.     Allergies  Penicillins  Home Medications   Current Outpatient Rx  Name  Route  Sig  Dispense  Refill  . acetaminophen (TYLENOL) 500 MG tablet   Oral   Take 1,000 mg by mouth every 6 (six) hours as needed for mild pain.         . fish oil-omega-3 fatty acids 1000 MG capsule   Oral   Take 1 g by mouth 3 (three) times daily.          Marland Kitchen LORazepam (ATIVAN) 0.5 MG tablet   Oral   Take 0.5 mg by mouth daily as needed. For anxiety         . metFORMIN (GLUCOPHAGE) 500 MG tablet   Oral   Take 1 tablet (500 mg total) by mouth daily.   30 tablet   5   . metoprolol succinate (TOPROL-XL) 50 MG 24 hr tablet   Oral   Take 1 tablet (50 mg total) by mouth every evening.   30 tablet   5   . Multiple Vitamin (MULTIVITAMIN WITH MINERALS) TABS tablet   Oral   Take 1 tablet by  mouth daily.         . paliperidone (INVEGA) 6 MG 24 hr tablet   Oral   Take 12 mg by mouth at bedtime.         . Probiotic Product (PROBIOTIC DAILY PO)   Oral   Take 1 tablet by mouth daily.         . sertraline (ZOLOFT) 100 MG tablet   Oral   Take 200 mg by mouth daily.          . vitamin E 400 UNIT capsule   Oral   Take 400 Units by mouth daily.         . meloxicam (MOBIC) 7.5 MG tablet   Oral   Take 2 tablets (15 mg total) by mouth daily.   30 tablet   0   . traMADol (ULTRAM) 50 MG tablet   Oral   Take 1 tablet (50 mg total) by mouth every 6 (six) hours as needed.   15 tablet   0    Triage Vitals: BP 147/81  Pulse 110  Temp(Src) 98.4 F (36.9 C) (Oral)  Resp 20  Ht 5\' 1"  (1.549 m)  Wt 175 lb (79.379 kg)  BMI 33.08 kg/m2  SpO2 99%  LMP 01/31/2014  Physical Exam  Nursing note and vitals reviewed. Constitutional: She is oriented to person, place, and time. She appears well-developed and well-nourished.  HENT:  Head: Normocephalic and atraumatic.  Eyes: EOM are  normal.  Neck: Normal range of motion.  Cardiovascular: Normal rate.   Pulses:      Dorsalis pedis pulses are 2+ on the left side.  Pulmonary/Chest: Effort normal.  Musculoskeletal: Normal range of motion.  No edema. Tenderness along medial aspect of left ankle.  Full ROM of left ankle. Able to bear weight.   Neurological: She is alert and oriented to person, place, and time.  Skin: Skin is warm and dry.  Skin intact left ankle. No ecchymosis or erythema. Cap refill less than two seconds.  Psychiatric: She has a normal mood and affect. Her behavior is normal.    ED Course  Procedures (including critical care time)  DIAGNOSTIC STUDIES: Oxygen Saturation is 99% on room air, normal by my interpretation.    COORDINATION OF CARE: 3:32 PM -Will have right ankle placed in splint. Recommend follow up with PCP if pain persists.  Patient verbalizes understanding and agrees with treatment plan.    Labs Review Labs Reviewed - No data to display Imaging Review Dg Ankle Complete Left  02/13/2014   CLINICAL DATA:  Left ankle pain medially after twisting injury  EXAM: LEFT ANKLE COMPLETE - 3+ VIEW  COMPARISON:  None.  FINDINGS: There is no evidence of fracture, dislocation, or joint effusion. There is no evidence of arthropathy or other focal bone abnormality. Soft tissues are unremarkable.  IMPRESSION: Negative.   Electronically Signed   By: Esperanza Heir M.D.   On: 02/13/2014 15:24   Dg Foot Complete Left  02/13/2014   CLINICAL DATA:  Left foot and ankle pain after twisting injury with fall  EXAM: LEFT FOOT - COMPLETE 3+ VIEW  COMPARISON:  None.  FINDINGS: There is no evidence of fracture or dislocation. There is no evidence of arthropathy or other focal bone abnormality. Soft tissues are unremarkable.  IMPRESSION: Negative.   Electronically Signed   By: Esperanza Heir M.D.   On: 02/13/2014 15:23     EKG Interpretation None      MDM   Final  diagnoses:  Left ankle sprain    Pt c/o  left ankle pain after twisting while in exercise class yesterday.  Left foot is neurovascularly in tact. Pt able to bear weight, no deformity. Plain films: negative for fracture or dislocation.  Will tx as sprain. Rx: ASO splint. Work note provided. Rx: mobic and tramadol. Advised to f/u with PCP next week, Dr. Audria NineMcPherson if symptoms not improving. Return precautions provided. Pt verbalized understanding and agreement with tx plan.   I personally performed the services described in this documentation, which was scribed in my presence. The recorded information has been reviewed and is accurate.    Junius FinnerErin O'Malley, PA-C 02/13/14 1628

## 2014-02-13 NOTE — ED Provider Notes (Signed)
Medical screening examination/treatment/procedure(s) were performed by non-physician practitioner and as supervising physician I was immediately available for consultation/collaboration.  Dominico Rod L Gari Hartsell, MD 02/13/14 1713 

## 2014-02-13 NOTE — Discharge Instructions (Signed)
Acute Ankle Sprain  with Phase I Rehab  An acute ankle sprain is a partial or complete tear in one or more of the ligaments of the ankle due to traumatic injury. The severity of the injury depends on both the the number of ligaments sprained and the grade of sprain. There are 3 grades of sprains.   · A grade 1 sprain is a mild sprain. There is a slight pull without obvious tearing. There is no loss of strength, and the muscle and ligament are the correct length.  · A grade 2 sprain is a moderate sprain. There is tearing of fibers within the substance of the ligament where it connects two bones or two cartilages. The length of the ligament is increased, and there is usually decreased strength.  · A grade 3 sprain is a complete rupture of the ligament and is uncommon.  In addition to the grade of sprain, there are three types of ankle sprains.   Lateral ankle sprains: This is a sprain of one or more of the three ligaments on the outer side (lateral) of the ankle. These are the most common sprains.  Medial ankle sprains: There is one large triangular ligament of the inner side (medial) of the ankle that is susceptible to injury. Medial ankle sprains are less common.  Syndesmosis, "high ankle," sprains: The syndesmosis is the ligament that connects the two bones of the lower leg. Syndesmosis sprains usually only occur with very severe ankle sprains.  SYMPTOMS  · Pain, tenderness, and swelling in the ankle, starting at the side of injury that may progress to the whole ankle and foot with time.  · "Pop" or tearing sensation at the time of injury.  · Bruising that may spread to the heel.  · Impaired ability to walk soon after injury.  CAUSES   · Acute ankle sprains are caused by trauma placed on the ankle that temporarily forces or pries the anklebone (talus) out of its normal socket.  · Stretching or tearing of the ligaments that normally hold the joint in place (usually due to a twisting injury).  RISK INCREASES  WITH:  · Previous ankle sprain.  · Sports in which the foot may land awkwardly (ie. basketball, volleyball, or soccer) or walking or running on uneven or rough surfaces.  · Shoes with inadequate support to prevent sideways motion when stress occurs.  · Poor strength and flexibility.  · Poor balance skills.  · Contact sports.  PREVENTION   · Warm up and stretch properly before activity.  · Maintain physical fitness:  · Ankle and leg flexibility, muscle strength, and endurance.  · Cardiovascular fitness.  · Balance training activities.  · Use proper technique and have a coach correct improper technique.  · Taping, protective strapping, bracing, or high-top tennis shoes may help prevent injury. Initially, tape is best; however, it loses most of its support function within 10 to 15 minutes.  · Wear proper fitted protective shoes (High-top shoes with taping or bracing is more effective than either alone).  · Provide the ankle with support during sports and practice activities for 12 months following injury.  PROGNOSIS   · If treated properly, ankle sprains can be expected to recover completely; however, the length of recovery depends on the degree of injury.  · A grade 1 sprain usually heals enough in 5 to 7 days to allow modified activity and requires an average of 6 weeks to heal completely.  · A grade 2 sprain requires   symptoms may result in a chronic problem. Appropriately addressing the problem the first time decreases the frequency of recurrence and optimizes healing time. Severity of the initial sprain does not predict the likelihood of later instability.  Injury to other structures (bone, cartilage, or tendon).  A chronically unstable or arthritic ankle joint is a possiblity with repeated  sprains. TREATMENT Treatment initially involves the use of ice, medication, and compression bandages to help reduce pain and inflammation. Ankle sprains are usually immobilized in a walking cast or boot to allow for healing. Crutches may be recommended to reduce pressure on the injury. After immobilization, strengthening and stretching exercises may be necessary to regain strength and a full range of motion. Surgery is rarely needed to treat ankle sprains. MEDICATION   Nonsteroidal anti-inflammatory medications, such as aspirin and ibuprofen (do not take for the first 3 days after injury or within 7 days before surgery), or other minor pain relievers, such as acetaminophen, are often recommended. Take these as directed by your caregiver. Contact your caregiver immediately if any bleeding, stomach upset, or signs of an allergic reaction occur from these medications.  Ointments applied to the skin may be helpful.  Pain relievers may be prescribed as necessary by your caregiver. Do not take prescription pain medication for longer than 4 to 7 days. Use only as directed and only as much as you need. HEAT AND COLD  Cold treatment (icing) is used to relieve pain and reduce inflammation for acute and chronic cases. Cold should be applied for 10 to 15 minutes every 2 to 3 hours for inflammation and pain and immediately after any activity that aggravates your symptoms. Use ice packs or an ice massage.  Heat treatment may be used before performing stretching and strengthening activities prescribed by your caregiver. Use a heat pack or a warm soak. SEEK IMMEDIATE MEDICAL CARE IF:   Pain, swelling, or bruising worsens despite treatment.  You experience pain, numbness, discoloration, or coldness in the foot or toes.  New, unexplained symptoms develop (drugs used in treatment may produce side effects.)  EXERCISES                            Hold stretches for 20-30 seconds, Repeat 2-3 times, Two to three  times daily.  PHASE I EXERCISES RANGE OF MOTION (ROM) AND STRETCHING EXERCISES - Ankle Sprain, Acute Phase I, Weeks 1 to 2 These exercises may help you when beginning to restore flexibility in your ankle. You will likely work on these exercises for the 1 to 2 weeks after your injury. Once your physician, physical therapist, or athletic trainer sees adequate progress, he or she will advance your exercises. While completing these exercises, remember:   Restoring tissue flexibility helps normal motion to return to the joints. This allows healthier, less painful movement and activity.  An effective stretch should be held for at least 30 seconds.  A stretch should never be painful. You should only feel a gentle lengthening or release in the stretched tissue. RANGE OF MOTION - Dorsi/Plantar Flexion  While sitting with your right / left knee straight, draw the top of your foot upwards by flexing your ankle. Then reverse the motion, pointing your toes downward.  Hold each position for __________ seconds.  After completing your first set of exercises, repeat this exercise with your knee bent. Repeat __________ times. Complete this exercise __________ times per day.  RANGE OF MOTION - Ankle Alphabet  Imagine  your right / left big toe is a pen.  Keeping your hip and knee still, write out the entire alphabet with your "pen." Make the letters as large as you can without increasing any discomfort. Repeat __________ times. Complete this exercise __________ times per day.  STRENGTHENING EXERCISES - Ankle Sprain, Acute -Phase I, Weeks 1 to 2 These exercises may help you when beginning to restore strength in your ankle. You will likely work on these exercises for 1 to 2 weeks after your injury. Once your physician, physical therapist, or athletic trainer sees adequate progress, he or she will advance your exercises. While completing these exercises, remember:   Muscles can gain both the endurance and the  strength needed for everyday activities through controlled exercises.  Complete these exercises as instructed by your physician, physical therapist, or athletic trainer. Progress the resistance and repetitions only as guided.  You may experience muscle soreness or fatigue, but the pain or discomfort you are trying to eliminate should never worsen during these exercises. If this pain does worsen, stop and make certain you are following the directions exactly. If the pain is still present after adjustments, discontinue the exercise until you can discuss the trouble with your clinician. STRENGTH - Dorsiflexors  Secure a rubber exercise band/tubing to a fixed object (ie. table, pole) and loop the other end around your right / left foot.  Sit on the floor facing the fixed object. The band/tubing should be slightly tense when your foot is relaxed.  Slowly draw your foot back toward you using your ankle and toes.  Hold this position for __________ seconds. Slowly release the tension in the band and return your foot to the starting position. Repeat __________ times. Complete this exercise __________ times per day.  STRENGTH - Plantar-flexors   Sit with your right / left leg extended. Holding onto both ends of a rubber exercise band/tubing, loop it around the ball of your foot. Keep a slight tension in the band.  Slowly push your toes away from you, pointing them downward.  Hold this position for __________ seconds. Return slowly, controlling the tension in the band/tubing. Repeat __________ times. Complete this exercise __________ times per day.  STRENGTH - Ankle Eversion  Secure one end of a rubber exercise band/tubing to a fixed object (table, pole). Loop the other end around your foot just before your toes.  Place your fists between your knees. This will focus your strengthening at your ankle.  Drawing the band/tubing across your opposite foot, slowly, pull your little toe out and up. Make  sure the band/tubing is positioned to resist the entire motion.  Hold this position for __________ seconds. Have your muscles resist the band/tubing as it slowly pulls your foot back to the starting position.  Repeat __________ times. Complete this exercise __________ times per day.  STRENGTH - Ankle Inversion  Secure one end of a rubber exercise band/tubing to a fixed object (table, pole). Loop the other end around your foot just before your toes.  Place your fists between your knees. This will focus your strengthening at your ankle.  Slowly, pull your big toe up and in, making sure the band/tubing is positioned to resist the entire motion.  Hold this position for __________ seconds.  Have your muscles resist the band/tubing as it slowly pulls your foot back to the starting position. Repeat __________ times. Complete this exercises __________ times per day.  STRENGTH - Towel Curls  Sit in a chair positioned on a non-carpeted  surface.  Place your right / left foot on a towel, keeping your heel on the floor.  Pull the towel toward your heel by only curling your toes. Keep your heel on the floor.  If instructed by your physician, physical therapist, or athletic trainer, add weight to the end of the towel. Repeat __________ times. Complete this exercise __________ times per day. Document Released: 05/25/2005 Document Revised: 01/16/2012 Document Reviewed: 02/05/2009 [facility] Patient Information 2014 [place], [place].

## 2014-02-13 NOTE — ED Notes (Signed)
Patient claims hurt foot/ankle when exercising yesterday.  Patient states has been keeping ice on it since yesterday.

## 2014-02-20 ENCOUNTER — Ambulatory Visit (INDEPENDENT_AMBULATORY_CARE_PROVIDER_SITE_OTHER): Payer: Self-pay | Admitting: Emergency Medicine

## 2014-02-20 VITALS — BP 164/80 | HR 115 | Temp 99.2°F | Resp 16 | Ht 61.0 in | Wt 185.0 lb

## 2014-02-20 DIAGNOSIS — I10 Essential (primary) hypertension: Secondary | ICD-10-CM

## 2014-02-20 DIAGNOSIS — E119 Type 2 diabetes mellitus without complications: Secondary | ICD-10-CM

## 2014-02-20 LAB — POCT GLYCOSYLATED HEMOGLOBIN (HGB A1C): HEMOGLOBIN A1C: 6.1

## 2014-02-20 LAB — GLUCOSE, POCT (MANUAL RESULT ENTRY): POC GLUCOSE: 208 mg/dL — AB (ref 70–99)

## 2014-02-20 NOTE — Addendum Note (Signed)
Addended by: Carmelina DaneANDERSON, Loistine Eberlin S on: 02/20/2014 08:42 PM   Modules accepted: Level of Service

## 2014-02-20 NOTE — Progress Notes (Signed)
[facility] [address], [place] [address] [phone]  Date:  02/20/2014   Name:  Elizabeth DrummerBillie J Aday   DOB:  11-May-1971   MRN:  [id number]  PCP:  Dow AdolphMCPHERSON,BARBARA, MD    Chief Complaint: Headache and Diabetes   History of Present Illness:  Elizabeth [name] is a 43 y.o. very pleasant female patient who presents with the following:  Took a double dose of her medication last night and slept through her alarm.  When she awakened around noon, she fell back to sleep, awakening at 1700.  Now has a headache and still feels groggy.  Did FBS and was 342.  Usually runs 116.    Patient Active Problem List   Diagnosis Date Noted  . Headache, common migraine (occasionally w/ aura) 04/25/2013  . Type II or unspecified type diabetes mellitus without mention of complication, not stated as uncontrolled 09/26/2012  . HTN (hypertension) 09/26/2012  . Overweight (BMI 25.0-29.9) 09/26/2012  . Metabolic syndrome 09/26/2012  . Bipolar depression     Past Medical History  Diagnosis Date  . Diabetes mellitus without complication   . Bipolar depression   . Anxiety   . Hypertension   . Allergy     No past surgical history on file.  History  Substance Use Topics  . Smoking status: Never Smoker   . Smokeless tobacco: Not on file  . Alcohol Use: No    Family History  Problem Relation Age of Onset  . Arthritis Mother   . Hypertension Mother   . Heart disease Mother   . Arthritis Father     rheumatoid  . Hypertension Father   . Hypertension Sister   . Thyroid disease Sister   . Congestive Heart Failure Maternal Grandfather   . Cancer Paternal Grandmother   . Heart disease Paternal Grandfather     Allergies  Allergen Reactions  . Penicillins Other (See Comments)    childhood    Medication list has been reviewed and updated.  Current Outpatient Prescriptions on File Prior to Visit  Medication Sig Dispense Refill  . LORazepam (ATIVAN) 0.5 MG tablet Take 0.5  mg by mouth daily as needed. For anxiety      . meloxicam (MOBIC) 7.5 MG tablet Take 2 tablets (15 mg total) by mouth daily.  30 tablet  0  . metFORMIN (GLUCOPHAGE) 500 MG tablet Take 1 tablet (500 mg total) by mouth daily.  30 tablet  5  . metoprolol succinate (TOPROL-XL) 50 MG 24 hr tablet Take 1 tablet (50 mg total) by mouth every evening.  30 tablet  5  . paliperidone (INVEGA) 6 MG 24 hr tablet Take 12 mg by mouth at bedtime.      . sertraline (ZOLOFT) 100 MG tablet Take 200 mg by mouth daily.       . traMADol (ULTRAM) 50 MG tablet Take 1 tablet (50 mg total) by mouth every 6 (six) hours as needed.  15 tablet  0  . vitamin E 400 UNIT capsule Take 400 Units by mouth daily.      Marland Kitchen. acetaminophen (TYLENOL) 500 MG tablet Take 1,000 mg by mouth every 6 (six) hours as needed for mild pain.      . fish oil-omega-3 fatty acids 1000 MG capsule Take 1 g by mouth 3 (three) times daily.       . Multiple Vitamin (MULTIVITAMIN WITH MINERALS) TABS tablet Take 1 tablet by mouth daily.      . Probiotic  Product (PROBIOTIC DAILY PO) Take 1 tablet by mouth daily.       No current facility-administered medications on file prior to visit.    Review of Systems:  As per HPI, otherwise negative.    Physical Examination: Filed Vitals:   02/20/14 1921  BP: 164/80  Pulse: 115  Temp: 99.2 F (37.3 C)  Resp: 16   Filed Vitals:   02/20/14 1921  Height: 5\' 1"  (1.549 m)  Weight: 185 lb (83.915 kg)   Body mass index is 34.97 kg/(m^2). Ideal Body Weight: Weight in (lb) to have BMI = 25: 132  As per HPI, otherwise negative.    Assessment and Plan:  Accidental overdose NIDDM HBP Follow up with Dr Audria NineMcPherson   Signed,  Phillips OdorJeffery Annaliese Saez, MD   Results for orders placed in visit on 02/20/14  POCT GLYCOSYLATED HEMOGLOBIN (HGB A1C)      Result Value Ref Range   Hemoglobin A1C 6.1    GLUCOSE, POCT (MANUAL RESULT ENTRY)      Result Value Ref Range   POC Glucose 208 (*) 70 - 99 mg/dl

## 2014-04-03 ENCOUNTER — Emergency Department (HOSPITAL_COMMUNITY)
Admission: EM | Admit: 2014-04-03 | Discharge: 2014-04-03 | Disposition: A | Discharge status: Home / Self Care | Payer: Self-pay | Attending: Emergency Medicine | Admitting: Emergency Medicine

## 2014-04-03 ENCOUNTER — Emergency Department (HOSPITAL_COMMUNITY): Payer: Self-pay

## 2014-04-03 ENCOUNTER — Encounter (HOSPITAL_COMMUNITY): Payer: Self-pay | Admitting: Emergency Medicine

## 2014-04-03 DIAGNOSIS — Z79899 Other long term (current) drug therapy: Secondary | ICD-10-CM | POA: Insufficient documentation

## 2014-04-03 DIAGNOSIS — Z880 Allergy status to penicillin: Secondary | ICD-10-CM | POA: Insufficient documentation

## 2014-04-03 DIAGNOSIS — Z3202 Encounter for pregnancy test, result negative: Secondary | ICD-10-CM | POA: Insufficient documentation

## 2014-04-03 DIAGNOSIS — J9801 Acute bronchospasm: Secondary | ICD-10-CM | POA: Insufficient documentation

## 2014-04-03 DIAGNOSIS — F411 Generalized anxiety disorder: Secondary | ICD-10-CM | POA: Insufficient documentation

## 2014-04-03 DIAGNOSIS — E119 Type 2 diabetes mellitus without complications: Secondary | ICD-10-CM | POA: Insufficient documentation

## 2014-04-03 DIAGNOSIS — I10 Essential (primary) hypertension: Secondary | ICD-10-CM | POA: Insufficient documentation

## 2014-04-03 DIAGNOSIS — M549 Dorsalgia, unspecified: Secondary | ICD-10-CM | POA: Insufficient documentation

## 2014-04-03 DIAGNOSIS — F319 Bipolar disorder, unspecified: Secondary | ICD-10-CM | POA: Insufficient documentation

## 2014-04-03 LAB — BASIC METABOLIC PANEL
BUN: 11 mg/dL (ref 6–23)
CALCIUM: 9.6 mg/dL (ref 8.4–10.5)
CO2: 22 mEq/L (ref 19–32)
Chloride: 99 mEq/L (ref 96–112)
Creatinine, Ser: 0.53 mg/dL (ref 0.50–1.10)
GLUCOSE: 202 mg/dL — AB (ref 70–99)
POTASSIUM: 3.6 meq/L — AB (ref 3.7–5.3)
SODIUM: 135 meq/L — AB (ref 137–147)

## 2014-04-03 LAB — HEPATIC FUNCTION PANEL
ALK PHOS: 87 U/L (ref 39–117)
ALT: 27 U/L (ref 0–35)
AST: 22 U/L (ref 0–37)
Albumin: 4.3 g/dL (ref 3.5–5.2)
BILIRUBIN TOTAL: 0.3 mg/dL (ref 0.3–1.2)
Bilirubin, Direct: <0.2 mg/dL (ref 0.0–0.3)
Total Protein: 7.6 g/dL (ref 6.0–8.3)

## 2014-04-03 LAB — PRO B NATRIURETIC PEPTIDE: PRO B NATRI PEPTIDE: 12.6 pg/mL (ref 0–125)

## 2014-04-03 LAB — I-STAT TROPONIN, ED: TROPONIN I, POC: 0 ng/mL (ref 0.00–0.08)

## 2014-04-03 LAB — LIPASE, BLOOD: Lipase: 48 U/L (ref 11–59)

## 2014-04-03 LAB — CBC
HCT: 36.8 % (ref 36.0–46.0)
HEMOGLOBIN: 12.9 g/dL (ref 12.0–15.0)
MCH: 30.1 pg (ref 26.0–34.0)
MCHC: 35.1 g/dL (ref 30.0–36.0)
MCV: 86 fL (ref 78.0–100.0)
PLATELETS: 232 10*3/uL (ref 150–400)
RBC: 4.28 MIL/uL (ref 3.87–5.11)
RDW: 13.6 % (ref 11.5–15.5)
WBC: 6.2 10*3/uL (ref 4.0–10.5)

## 2014-04-03 LAB — D-DIMER, QUANTITATIVE: D-Dimer, Quant: <0.27 ug/mL-FEU (ref 0.00–0.48)

## 2014-04-03 LAB — POC URINE PREG, ED: Preg Test, Ur: NEGATIVE

## 2014-04-03 MED ORDER — LORATADINE 10 MG PO TABS: 10.0000 mg | ORAL_TABLET | Freq: Every day | ORAL | Status: AC

## 2014-04-03 MED ORDER — ALBUTEROL SULFATE HFA 108 (90 BASE) MCG/ACT IN AERS
2.0000 | INHALATION_SPRAY | RESPIRATORY_TRACT | Status: DC | PRN
  Administered 2014-04-03: 2 via RESPIRATORY_TRACT
  Filled 2014-04-03: qty 6.7

## 2014-04-03 MED ORDER — IPRATROPIUM BROMIDE 0.02 % IN SOLN
0.5000 mg | Freq: Once | RESPIRATORY_TRACT | Status: AC
  Administered 2014-04-03: 0.5 mg via RESPIRATORY_TRACT
  Filled 2014-04-03: qty 2.5

## 2014-04-03 MED ORDER — ALBUTEROL SULFATE (2.5 MG/3ML) 0.083% IN NEBU
5.0000 mg | INHALATION_SOLUTION | Freq: Once | RESPIRATORY_TRACT | Status: AC
  Administered 2014-04-03: 5 mg via RESPIRATORY_TRACT
  Filled 2014-04-03: qty 6

## 2014-04-03 NOTE — Discharge Instructions (Signed)
Bronchospasm, Adult A bronchospasm is a spasm or tightening of the airways going into the lungs. During a bronchospasm breathing becomes more difficult because the airways get smaller. When this happens there can be coughing, a whistling sound when breathing (wheezing), and difficulty breathing. Bronchospasm is often associated with asthma, but not all patients who experience a bronchospasm have asthma. CAUSES  A bronchospasm is caused by inflammation or irritation of the airways. The inflammation or irritation may be triggered by:   Allergies (such as to animals, pollen, food, or mold). Allergens that cause bronchospasm may cause wheezing immediately after exposure or many hours later.   Infection. Viral infections are believed to be the most common cause of bronchospasm.   Exercise.   Irritants (such as pollution, cigarette smoke, strong odors, aerosol sprays, and paint fumes).   Weather changes. Winds increase molds and pollens in the air. Rain refreshes the air by washing irritants out. Cold air may cause inflammation.   Stress and emotional upset.  SIGNS AND SYMPTOMS   Wheezing.   Excessive nighttime coughing.   Frequent or severe coughing with a simple cold.   Chest tightness.   Shortness of breath.  DIAGNOSIS  Bronchospasm is usually diagnosed through a history and physical exam. Tests, such as chest X-rays, are sometimes done to look for other conditions. TREATMENT   Inhaled medicines can be given to open up your airways and help you breathe. The medicines can be given using either an inhaler or a nebulizer machine.  Corticosteroid medicines may be given for severe bronchospasm, usually when it is associated with asthma. HOME CARE INSTRUCTIONS   Always have a plan prepared for seeking medical care. Know when to call your health care provider and local emergency services (911 in the [place] Know where you can access local emergency care.  Only take medicines as  directed by your health care provider.  If you were prescribed an inhaler or nebulizer machine, ask your health care provider to explain how to use it correctly. Always use a spacer with your inhaler if you were given one.  It is necessary to remain calm during an attack. Try to relax and breathe more slowly.  Control your home environment in the following ways:   Change your heating and air conditioning filter at least once a month.   Limit your use of fireplaces and wood stoves.  Do not smoke and do not allow smoking in your home.   Avoid exposure to perfumes and fragrances.   Get rid of pests (such as roaches and mice) and their droppings.   Throw away plants if you see mold on them.   Keep your house clean and dust free.   Replace carpet with wood, tile, or vinyl flooring. Carpet can trap dander and dust.   Use allergy-proof pillows, mattress covers, and box spring covers.   Wash bed sheets and blankets every week in hot water and dry them in a dryer.   Use blankets that are made of polyester or cotton.   Wash hands frequently. SEEK MEDICAL CARE IF:   You have muscle aches.   You have chest pain.   The sputum changes from clear or white to yellow, green, gray, or bloody.   The sputum you cough up gets thicker.   There are problems that may be related to the medicine you are given, such as a rash, itching, swelling, or trouble breathing.  SEEK IMMEDIATE MEDICAL CARE IF:   You have worsening wheezing and coughing  even after taking your prescribed medicines.   You have increased difficulty breathing.   You develop severe chest pain. MAKE SURE YOU:   Understand these instructions.  Will watch your condition.  Will get help right away if you are not doing well or get worse. Document Released: 10/27/2003 Document Revised: 06/26/2013 Document Reviewed: 04/15/2013 [facility] Patient Information 2014 [place], [place].  How to Use an Inhaler Using  your inhaler correctly is very important. Good technique will make sure that the medicine reaches your lungs.  HOW TO USE AN INHALER: 1. Take the cap off the inhaler. 2. If this is the first time using your inhaler, you need to prime it. Shake the inhaler for 5 seconds. Release four puffs into the air, away from your face. Ask your doctor for help if you have questions. 3. Shake the inhaler for 5 seconds. 4. Turn the inhaler so the bottle is above the mouthpiece. 5. Put your pointer finger on top of the bottle. Your thumb holds the bottom of the inhaler. 6. Open your mouth. 7. Either hold the inhaler away from your mouth (the width of 2 fingers) or place your lips tightly around the mouthpiece. Ask your doctor which way to use your inhaler. 8. Breathe out as much air as possible. 9. Breathe in and push down on the bottle 1 time to release the medicine. You will feel the medicine go in your mouth and throat. 10. Continue to take a deep breath in very slowly. Try to fill your lungs. 11. After you have breathed in completely, hold your breath for 10 seconds. This will help the medicine to settle in your lungs. If you cannot hold your breath for 10 seconds, hold it for as long as you can before you breathe out. 12. Breathe out slowly, through pursed lips. Whistling is an example of pursed lips. 13. If your doctor has told you to take more than 1 puff, wait at least 15 30 seconds between puffs. This will help you get the best results from your medicine. Do not use the inhaler more than your doctor tells you to. 14. Put the cap back on the inhaler. 15. Follow the directions from your doctor or from the inhaler package about cleaning the inhaler. If you use more than one inhaler, ask your doctor which inhalers to use and what order to use them in. Ask your doctor to help you figure out when you will need to refill your inhaler.  If you use a steroid inhaler, always rinse your mouth with water after your  last puff, gargle and spit out the water. Do not swallow the water. GET HELP IF:  The inhaler medicine only partially helps to stop wheezing or shortness of breath.  You are having trouble using your inhaler.  You have some increase in thick spit (phlegm). GET HELP RIGHT AWAY IF:  The inhaler medicine does not help your wheezing or shortness of breath or you have tightness in your chest.  You have dizziness, headaches, or fast heart rate.  You have chills, fever, or night sweats.  You have a large increase of thick spit, or your thick spit is bloody. MAKE SURE YOU:   Understand these instructions.  Will watch your condition.  Will get help right away if you are not doing well or get worse. Document Released: 08/02/2008 Document Revised: 08/14/2013 Document Reviewed: 05/23/2013 [facility] Patient Information 2014 [place], [place].

## 2014-04-03 NOTE — ED Notes (Signed)
Pt taken to xray 

## 2014-04-03 NOTE — ED Provider Notes (Signed)
CSN: [id number]     Arrival date & time 04/03/14  1643 History   First MD Initiated Contact with Patient 04/03/14 1736     Chief Complaint  Patient presents with  . Shortness of Breath  . Back Pain  . Chest Pain     (Consider location/radiation/quality/duration/timing/severity/associated sxs/prior Treatment) HPI  Patient presents to the emergency department with complaints of right mid back pain, chest tightness and feeling like she can't take a large breath starting this morning. She reports working in a [employer] rhythm and being outside on scavenger husband today. She denies having any anterior chest pain, nausea, vomiting, diaphoresis, weakness, lower extremity pain, radiation of pain. She denies having history of allergies, asthma, and right upper quadrant pain in the past. She's a diabetic and does not check her sugars on a daily basis. She reports that typically run between 200 and 300 and are  not well controlled.  Past Medical History  Diagnosis Date  . Diabetes mellitus without complication   . Bipolar depression   . Anxiety   . Hypertension   . Allergy    History reviewed. No pertinent past surgical history. Family History  Problem Relation Age of Onset  . Arthritis Mother   . Hypertension Mother   . Heart disease Mother   . Arthritis Father     rheumatoid  . Hypertension Father   . Hypertension Sister   . Thyroid disease Sister   . Congestive Heart Failure Maternal Grandfather   . Cancer Paternal Grandmother   . Heart disease Paternal Grandfather    History  Substance Use Topics  . Smoking status: Never Smoker   . Smokeless tobacco: Not on file  . Alcohol Use: No   OB History   Grav Para Term Preterm Abortions TAB SAB Ect Mult Living                 Review of Systems  All other systems reviewed and are negative.     Allergies  Penicillins  Home Medications   Prior to Admission medications   Medication Sig Start Date End Date Taking?  Authorizing Provider  acetaminophen (TYLENOL) 500 MG tablet Take 1,000 mg by mouth every 6 (six) hours as needed for mild pain.   Yes Historical Provider, MD  metFORMIN (GLUCOPHAGE) 500 MG tablet Take 1 tablet (500 mg total) by mouth daily. 01/24/14  Yes Maurice March, MD  metoprolol succinate (TOPROL-XL) 50 MG 24 hr tablet Take 1 tablet (50 mg total) by mouth every evening. 01/24/14  Yes Maurice March, MD  Multiple Vitamin (MULTIVITAMIN WITH MINERALS) TABS tablet Take 1 tablet by mouth daily.   Yes Historical Provider, MD  paliperidone (INVEGA) 6 MG 24 hr tablet Take 12 mg by mouth at bedtime.   Yes Historical Provider, MD  sertraline (ZOLOFT) 100 MG tablet Take 200 mg by mouth daily.    Yes Historical Provider, MD  vitamin E 400 UNIT capsule Take 400 Units by mouth daily.   Yes Historical Provider, MD  loratadine (CLARITIN) 10 MG tablet Take 1 tablet (10 mg total) by mouth daily. 04/03/14   Sebrina Kessner Irine Seal, PA-C   BP 142/69  Pulse 117  Temp(Src) 98.8 F (37.1 C) (Oral)  Resp 18  SpO2 93%  LMP 03/26/2014 Physical Exam  Nursing note and vitals reviewed. Constitutional: She appears well-developed and well-nourished. No distress.  HENT:  Head: Normocephalic and atraumatic.  Eyes: Pupils are equal, round, and reactive to light.  Neck: Normal range  of motion. Neck supple.  Cardiovascular: Normal rate and regular rhythm.   Pulmonary/Chest: Effort normal. No respiratory distress. She has no wheezes.  Abdominal: Soft. Bowel sounds are normal. There is no tenderness. There is no rebound, no guarding and no CVA tenderness.  Neurological: She is alert.  Skin: Skin is warm and dry.    ED Course  Procedures (including critical care time) Labs Review Labs Reviewed  BASIC METABOLIC PANEL - Abnormal; Notable for the following:    Sodium 135 (*)    Potassium 3.6 (*)    Glucose, Bld 202 (*)    All other components within normal limits  CBC  PRO B NATRIURETIC PEPTIDE  LIPASE, BLOOD   HEPATIC FUNCTION PANEL  D-DIMER, QUANTITATIVE  I-STAT TROPOININ, ED  POC URINE PREG, ED    Imaging Review Dg Chest 2 View  04/03/2014   CLINICAL DATA:  Right-sided chest pain, shortness of breath  EXAM: CHEST  2 VIEW  COMPARISON:  Prior chest x-ray 06/26/2013  FINDINGS: The lungs are clear and negative for focal airspace consolidation, pulmonary edema or suspicious pulmonary nodule. No pleural effusion or pneumothorax. Cardiac and mediastinal contours are within normal limits. No acute fracture or lytic or blastic osseous lesions. The visualized upper abdominal bowel gas pattern is unremarkable.  IMPRESSION: No active cardiopulmonary disease.   Electronically Signed   By: Malachy MoanHeath  McCullough M.D.   On: 04/03/2014 20:13     EKG Interpretation None      MDM   Final diagnoses:  Bronchospasm    Patient has had a negative d-dimer, negative troponin, normal EKG, normal lipase, normal liver enzymes and chest x-ray without any significant findings. She's given a Atrovent and albuterol breathing treatment which she reports gave her significant improvement of her symptoms.  I believe her symptoms to be related to possible bronchospasm due to irritant. Will give albuterol and Claritin prescription for home. Since her glucose is not regulated and has not been in control I'm hesitant to give steroids. Discussed pros versus cons with patient and we both decided against steroid treatment. She will call her primary care Dr. tomorrow to try to be seen same day or early next week.  Dr. Romeo AppleHarrison has seen patient as well and agrees with treatment and plan.  43 y.o.Elizabeth [name]'s evaluation in the Emergency Department is complete. It has been determined that no acute conditions requiring further emergency intervention are present at this time. The patient/guardian have been advised of the diagnosis and plan. We have discussed signs and symptoms that warrant return to the ED, such as changes or worsening in  symptoms.  Vital signs are stable at discharge. Filed Vitals:   04/03/14 1928  BP: 142/69  Pulse:   Temp:   Resp: 18    Patient/guardian has voiced understanding and agreed to follow-up with the PCP or specialist.     Dorthula Matasiffany G Glennda Weatherholtz, PA-C 04/03/14 2114

## 2014-04-03 NOTE — ED Notes (Signed)
Pt states that when she woke up this morning had a pain in mid back with breathing, some tightness in chest, shortness of breath-- "feel like I can't get a deep breath, hurts to breath" . Pt also diabetic,

## 2014-04-04 NOTE — ED Provider Notes (Signed)
Medical screening examination/treatment/procedure(s) were conducted as a shared visit with non-physician practitioner(s) and myself.  I personally evaluated the patient during the encounter.   EKG Interpretation   Date/Time:  Thursday Apr 03 2014 16:55:10 EDT Ventricular Rate:  98 PR Interval:  140 QRS Duration: 86 QT Interval:  356 QTC Calculation: 454 R Axis:   93 Text Interpretation:  Normal sinus rhythm Rightward axis No significant  change since last tracing Confirmed by Natividad Halls  MD, Mikel Pyon ([id number]) on  04/04/2014 10:05:31 AM       I interviewed and examined the patient. Lungs are CTAB. Cardiac exam wnl. Abdomen soft. Sx possibly d/t bronchospasm and irritant. Pt feeling better w/ albuterol. Labs/imaging otherwise non-contrib. Will rec f/u w/ pcp. Return precautions provided.   Junius Argyle, MD 04/04/14 1006

## 2014-04-08 ENCOUNTER — Ambulatory Visit: Payer: Self-pay | Admitting: Internal Medicine

## 2014-04-08 VITALS — BP 172/78 | HR 146 | Temp 98.2°F | Resp 18 | Ht 61.0 in | Wt 180.8 lb

## 2014-04-08 DIAGNOSIS — J9809 Other diseases of bronchus, not elsewhere classified: Secondary | ICD-10-CM

## 2014-04-08 DIAGNOSIS — J988 Other specified respiratory disorders: Secondary | ICD-10-CM

## 2014-04-08 DIAGNOSIS — R0602 Shortness of breath: Secondary | ICD-10-CM

## 2014-04-08 DIAGNOSIS — R000 Tachycardia, unspecified: Secondary | ICD-10-CM

## 2014-04-08 DIAGNOSIS — I498 Other specified cardiac arrhythmias: Secondary | ICD-10-CM

## 2014-04-08 DIAGNOSIS — J398 Other specified diseases of upper respiratory tract: Secondary | ICD-10-CM

## 2014-04-08 MED ORDER — IPRATROPIUM BROMIDE 0.02 % IN SOLN
0.5000 mg | Freq: Once | RESPIRATORY_TRACT | Status: AC
  Administered 2014-04-08: 0.5 mg via RESPIRATORY_TRACT

## 2014-04-08 MED ORDER — PREDNISONE 20 MG PO TABS: ORAL_TABLET | ORAL | Status: DC

## 2014-04-08 MED ORDER — IPRATROPIUM BROMIDE HFA 17 MCG/ACT IN AERS: 2.0000 | INHALATION_SPRAY | Freq: Four times a day (QID) | RESPIRATORY_TRACT | Status: DC | PRN

## 2014-04-08 NOTE — Progress Notes (Signed)
Subjective:    Patient ID: Elizabeth [name], female    DOB: Aug 26, 1971, 43 y.o.   MRN: [id number]  HPI This chart was scribed for Tonye Pearson, MD by Charline Bills, ED Scribe. The patient was seen in room 4. Patient's care was started at 4:23 PM.  HPI Comments: Elizabeth [name] is a 43 y.o. female who presents to the [facility] complaining of sudden SOB with associated back pain onset 5 days ago. Pt states that she went to ED and was prescribed albuterol and Claritin. She also had a CXR and EKG obtained. She was told to follow-up with PCP Dr. Audria Nine if symptoms did not resolve. Pt still reports nasal congestion, watery eyes, post-nasal drip. She denies purulent rhinorrhea, fever. Pt states that symptoms are worse with going outside. Pt last used inhaler around 2:30 PM. She reports temporary relief but states that she feels as if  "things are closing in". Pt denies h/o asthma.  Past Medical History  Diagnosis Date  . Diabetes mellitus without complication   . Bipolar depression   . Anxiety   . Hypertension   . Allergy     -- She has a history of sinus tachycardia for which she takes Toprol  Current Outpatient Prescriptions on File Prior to Visit  Medication Sig Dispense Refill  . acetaminophen (TYLENOL) 500 MG tablet Take 1,000 mg by mouth every 6 (six) hours as needed for mild pain.      Marland Kitchen loratadine (CLARITIN) 10 MG tablet Take 1 tablet (10 mg total) by mouth daily.  14 tablet  0  . metFORMIN (GLUCOPHAGE) 500 MG tablet Take 1 tablet (500 mg total) by mouth daily.  30 tablet  5  . metoprolol succinate (TOPROL-XL) 50 MG 24 hr tablet Take 1 tablet (50 mg total) by mouth every evening.  30 tablet  5  . Multiple Vitamin (MULTIVITAMIN WITH MINERALS) TABS tablet Take 1 tablet by mouth daily.      . paliperidone (INVEGA) 6 MG 24 hr tablet Take 12 mg by mouth at bedtime.      . sertraline (ZOLOFT) 100 MG tablet Take 200 mg by mouth daily.       . vitamin E 400 UNIT  capsule Take 400 Units by mouth daily.       No current facility-administered medications on file prior to visit.   Allergies  Allergen Reactions  . Penicillins Other (See Comments)    Childhood reaction    Review of Systems  Constitutional: Negative for fever.  HENT: Positive for congestion and postnasal drip. Negative for rhinorrhea.   Eyes: Positive for discharge (watery).  Respiratory: Positive for shortness of breath.       Objective:   Physical Exam  Nursing note and vitals reviewed. Constitutional: She is oriented to person, place, and time. She appears well-developed and well-nourished. No distress.  HENT:  Head: Normocephalic and atraumatic.  Mouth/Throat: Oropharynx is clear and moist and mucous membranes are normal.  Nares are boggy with no exudate  Eyes: EOM are normal. Pupils are equal, round, and reactive to light.  Neck: Neck supple. No mass and no thyromegaly present.  Cardiovascular: Normal rate, regular rhythm and normal heart sounds.   No murmur heard. Rate of 140 Extremities have good peripheral pulses  Pulmonary/Chest: Effort normal and breath sounds normal. No respiratory distress. She has no decreased breath sounds. She has no wheezes.  Musculoskeletal: Normal range of motion.  Neurological: She is alert and oriented  to person, place, and time.  Skin: Skin is warm and dry.  Psychiatric: She has a normal mood and affect. Her behavior is normal.     she used albuterol 2 and half hours ago and feels like she is beginning to be tight again  Nebulizer with Atrovent produced excellent results in symptomatic relief and her heartrate decreased to 105 Assessment & Plan:   I personally performed the services described in this documentation, which was scribed in my presence. The recorded information has been reviewed and is accurate.  SOB (shortness of breath) - Plan: ipratropium (ATROVENT) nebulizer solution 0.5 mg  Bronchospastic airway disease  Sinus  tachycardia  Meds ordered this encounter  Medications  . ipratropium (ATROVENT) nebulizer solution 0.5 mg    Sig:   . predniSONE (DELTASONE) 20 MG tablet    Sig: 3/3/2/2/1/1 single daily dose for 6 days    Dispense:  12 tablet    Refill:  0  . ipratropium (ATROVENT HFA) 17 MCG/ACT inhaler    Sig: Inhale 2 puffs into the lungs every 6 (six) hours as needed for wheezing.    Dispense:  1 Inhaler    Refill:  1   She is cautioned about the effect of prednisone on her glycemic control and will be testing frequently She is given Atrovent to add to albuterol for when necessary use as it produce such good results with no tachycardia She will followup in 3-5 days if not controlled - follow up appointment with Dr. Audria NineMcPherson for routine checkup in 10 days

## 2014-04-17 ENCOUNTER — Ambulatory Visit: Payer: Self-pay | Admitting: Family Medicine

## 2014-05-05 ENCOUNTER — Ambulatory Visit (INDEPENDENT_AMBULATORY_CARE_PROVIDER_SITE_OTHER): Payer: Self-pay | Admitting: Physician Assistant

## 2014-05-05 VITALS — BP 128/77 | HR 87 | Temp 98.5°F | Resp 18 | Ht 61.5 in | Wt 180.6 lb

## 2014-05-05 DIAGNOSIS — K120 Recurrent oral aphthae: Secondary | ICD-10-CM

## 2014-05-05 DIAGNOSIS — R059 Cough, unspecified: Secondary | ICD-10-CM

## 2014-05-05 DIAGNOSIS — J029 Acute pharyngitis, unspecified: Secondary | ICD-10-CM

## 2014-05-05 DIAGNOSIS — R05 Cough: Secondary | ICD-10-CM

## 2014-05-05 MED ORDER — GUAIFENESIN ER 1200 MG PO TB12: 1.0000 | ORAL_TABLET | Freq: Two times a day (BID) | ORAL | Status: DC | PRN

## 2014-05-05 MED ORDER — IPRATROPIUM BROMIDE 0.03 % NA SOLN: 2.0000 | Freq: Two times a day (BID) | NASAL | Status: DC

## 2014-05-05 MED ORDER — BENZONATATE 100 MG PO CAPS: 100.0000 mg | ORAL_CAPSULE | Freq: Three times a day (TID) | ORAL | Status: DC | PRN

## 2014-05-05 NOTE — Patient Instructions (Signed)
Get plenty of rest and drink at least 64 ounces of water daily. Monitor your blood sugar.  Both being ill, and Mucinex, can cause elevation in blood sugar for some people.
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
## 2014-07-25 ENCOUNTER — Encounter: Payer: Self-pay | Admitting: Family Medicine

## 2014-07-25 ENCOUNTER — Ambulatory Visit (INDEPENDENT_AMBULATORY_CARE_PROVIDER_SITE_OTHER): Payer: Self-pay | Admitting: Family Medicine

## 2014-07-25 VITALS — BP 139/90 | HR 105 | Temp 98.3°F | Resp 16 | Ht 62.0 in | Wt 180.0 lb

## 2014-07-25 DIAGNOSIS — E119 Type 2 diabetes mellitus without complications: Secondary | ICD-10-CM

## 2014-07-25 DIAGNOSIS — R5383 Other fatigue: Secondary | ICD-10-CM

## 2014-07-25 DIAGNOSIS — I10 Essential (primary) hypertension: Secondary | ICD-10-CM

## 2014-07-25 DIAGNOSIS — R5381 Other malaise: Secondary | ICD-10-CM

## 2014-07-25 DIAGNOSIS — Z1231 Encounter for screening mammogram for malignant neoplasm of breast: Secondary | ICD-10-CM

## 2014-07-25 DIAGNOSIS — Z9109 Other allergy status, other than to drugs and biological substances: Secondary | ICD-10-CM | POA: Insufficient documentation

## 2014-07-25 LAB — BASIC METABOLIC PANEL
BUN: 8 mg/dL (ref 6–23)
CALCIUM: 9.8 mg/dL (ref 8.4–10.5)
CO2: 22 mEq/L (ref 19–32)
Chloride: 102 mEq/L (ref 96–112)
Creat: 0.58 mg/dL (ref 0.50–1.10)
GLUCOSE: 266 mg/dL — AB (ref 70–99)
POTASSIUM: 4.2 meq/L (ref 3.5–5.3)
SODIUM: 137 meq/L (ref 135–145)

## 2014-07-25 LAB — POCT GLYCOSYLATED HEMOGLOBIN (HGB A1C): Hemoglobin A1C: 7.3

## 2014-07-25 MED ORDER — IPRATROPIUM BROMIDE 0.03 % NA SOLN: 2.0000 | Freq: Two times a day (BID) | NASAL | Status: DC

## 2014-07-25 MED ORDER — METOPROLOL SUCCINATE ER 50 MG PO TB24: 50.0000 mg | ORAL_TABLET | Freq: Every evening | ORAL | Status: DC

## 2014-07-25 MED ORDER — METFORMIN HCL 850 MG PO TABS: ORAL_TABLET | ORAL | Status: DC

## 2014-07-25 NOTE — Patient Instructions (Addendum)
ANKLE PAIN-  ARNICA CREAM is a topical joint and muscle product that you can buy over-the-counter. Apply this to achy joints 2-3 ties a day.  ALLERGIES- Take your allergy medication and use your nasal spray daily.

## 2014-07-26 LAB — THYROID PANEL WITH TSH
FREE THYROXINE INDEX: 2.1 (ref 1.4–3.8)
T3 Uptake: 25 % (ref 22.0–35.0)
T4, Total: 8.5 ug/dL (ref 4.5–12.0)
TSH: 1.675 u[IU]/mL (ref 0.350–4.500)

## 2014-07-26 LAB — VITAMIN D 25 HYDROXY (VIT D DEFICIENCY, FRACTURES): Vit D, 25-Hydroxy: 28 ng/mL — ABNORMAL LOW (ref 30–89)

## 2014-07-27 NOTE — Progress Notes (Signed)
Quick Note:  Please advise pt regarding following labs... All labs are normal; blood sugar is above normal. We discussed medication change at your visit. Thyroid results are normal. Vitamin D is below normal. Your multivitamin probably does not have enough Vitamin D in it; get a Vitamin D3 supplement 1000 units and take 1 daily. Eat more foods that have Vitamin D (salmon, tuna, eggs, mushrooms and some dairy products) and get 10-15 minutes of sun exposure most days of the week. This allows your body to use the Vitamin D.  Contact the clinic if you have any questions or concerns. ______

## 2014-07-29 NOTE — Progress Notes (Signed)
Subjective:    Patient ID: Elizabeth [name], female    DOB: [birth year], 43 y.o.   MRN: [id number]  Diabetes Associated symptoms include fatigue.    This 43 y.o. Cauc female is here for DM follow-up; previously A1c= 6.1% in April 2015. She is compliant w/ medications but reports FSBS in mid 100s. She has a difficult work schedule and does not follow a meal plan daily. She has no time for regular fitness. She c/o fatigue, in part due to increased allergic symptoms.  OTC medication (Claritin) is effective. She was prescribed Atrovent nasal spray in June but has only been using it as needed.   HTN- pt is compliant w/ medication w/o adverse effects. She is asymptomatic.Elevated BP readings associated w/ job stress.  HCM- pt requests mmg scheduling but is concerned about lack of insurance. Last mmg > 3 years ago.  Patient Active Problem List   Diagnosis Date Noted  . Environmental allergies 07/25/2014  . Headache, common migraine (occasionally w/ aura) 04/25/2013  . Type II or unspecified type diabetes mellitus without mention of complication, not stated as uncontrolled 09/26/2012  . HTN (hypertension) 09/26/2012  . Overweight (BMI 25.0-29.9) 09/26/2012  . Metabolic syndrome 09/26/2012  . Bipolar depression     Prior to Admission medications   Medication Sig Start Date End Date Taking? Authorizing Provider  acetaminophen (TYLENOL) 500 MG tablet Take 1,000 mg by mouth every 6 (six) hours as needed for mild pain.   Yes Historical Provider, MD  loratadine (CLARITIN) 10 MG tablet Take 1 tablet (10 mg total) by mouth daily. 04/03/14  Yes Tiffany Irine Seal, PA-C  metoprolol succinate (TOPROL-XL) 50 MG 24 hr tablet Take 1 tablet (50 mg total) by mouth every evening.   Yes Maurice March, MD  Multiple Vitamin (MULTIVITAMIN WITH MINERALS) TABS tablet Take 1 tablet by mouth daily.   Yes Historical Provider, MD  paliperidone (INVEGA) 6 MG 24 hr tablet Take 12 mg by mouth at bedtime.   Yes Historical  Provider, MD  sertraline (ZOLOFT) 100 MG tablet Take 200 mg by mouth daily.    Yes Historical Provider, MD  vitamin E 400 UNIT capsule Take 400 Units by mouth daily.   Yes Historical Provider, MD  benzonatate (TESSALON) 100 MG capsule Take 1-2 capsules (100-200 mg total) by mouth 3 (three) times daily as needed for cough. 05/05/14   Chelle S Jeffery, PA-C  Guaifenesin (MUCINEX MAXIMUM STRENGTH) 1200 MG TB12 Take 1 tablet (1,200 mg total) by mouth every 12 (twelve) hours as needed. 05/05/14   Chelle S Jeffery, PA-C  ipratropium (ATROVENT) 0.03 % nasal spray Place 2 sprays into both nostrils 2 (two) times daily.      metFORMIN (GLUCOPHAGE) 500 MG tablet Take 1 tablet once a day with largest meal.    Maurice March, MD    History   Social History  . Marital Status: Single    Spouse Name: N/A    Number of Children: 0  . Years of Education: N/A   Occupational History  . cashier-part time     [employer]  .    . student     medical office administration [employer]   Social History Main Topics  . Smoking status: Never Smoker   . Smokeless tobacco: Never Used  . Alcohol Use: No  . Drug Use: No  . Sexual Activity: Not on file     Comment: number of sex partners in the last 12 months  0   Other  Topics Concern  . Not on file   Social History Narrative   Lives alone. Sister lives in [place].    Family History  Problem Relation Age of Onset  . Arthritis Mother   . Hypertension Mother   . Heart disease Mother   . Arthritis Father     rheumatoid  . Hypertension Father   . Hypertension Sister   . Thyroid disease Sister   . Congestive Heart Failure Maternal Grandfather   . Cancer Paternal Grandmother   . Heart disease Paternal Grandfather     Review of Systems  Constitutional: Positive for fatigue. Negative for fever, chills, diaphoresis, activity change, appetite change and unexpected weight change.  HENT: Positive for congestion, postnasal drip, rhinorrhea and sneezing.  Negative for sore throat.   Eyes: Negative for redness, itching and visual disturbance.  Respiratory: Negative.   Cardiovascular: Negative.   Gastrointestinal: Negative.   Endocrine: Negative.   Skin: Negative.   Psychiatric/Behavioral: Negative.        Objective:   Physical Exam  Nursing note and vitals reviewed. Constitutional: She is oriented to person, place, and time. She appears well-developed and well-nourished. No distress.  HENT:  Head: Normocephalic and atraumatic.  Right Ear: Hearing, tympanic membrane, external ear and ear canal normal.  Left Ear: Hearing, tympanic membrane, external ear and ear canal normal.  Nose: Nose normal. No mucosal edema, nasal deformity or septal deviation. Right sinus exhibits no maxillary sinus tenderness and no frontal sinus tenderness. Left sinus exhibits no maxillary sinus tenderness and no frontal sinus tenderness.  Mouth/Throat: Uvula is midline and mucous membranes are normal. No oral lesions. No uvula swelling. Posterior oropharyngeal erythema present. No oropharyngeal exudate.  Neck: Normal range of motion and full passive range of motion without pain. Neck supple. No mass and no thyromegaly present.  Cardiovascular: Normal rate, regular rhythm and normal heart sounds.   Pulmonary/Chest: Breath sounds normal. She is in respiratory distress.  Musculoskeletal: Normal range of motion. She exhibits no edema and no tenderness.  Lymphadenopathy:    She has no cervical adenopathy.  Neurological: She is alert and oriented to person, place, and time. No cranial nerve deficit. She exhibits normal muscle tone. Coordination normal.  Skin: Skin is warm and dry. No rash noted. She is not diaphoretic. No erythema.  Psychiatric: She has a normal mood and affect. Her behavior is normal. Judgment and thought content normal.    A1c= 7.3%      Assessment & Plan:  Type II or unspecified type diabetes mellitus without mention of complication, not stated  as uncontrolled - Control inadequate; increase Metformin to 850 mg  1 tablet daily with largest meal.  Plan: POCT glycosylated hemoglobin (Hb A1C), Thyroid Panel With TSH  Environmental allergies - Continue OTC Claritin and nasal spray. Plan: ipratropium (ATROVENT) 0.03 % nasal spray  Essential hypertension - Stable on current medication; encourage improved nutrition and weight loss. Stress reduction important also. Plan: Basic metabolic panel, Vitamin D, 25-hydroxy  Other fatigue - Plan: Vitamin D, 25-hydroxy, Thyroid Panel With TSH  Other screening mammogram - Plan: MM Digital Screening   Meds ordered this encounter  Medications  . ipratropium (ATROVENT) 0.03 % nasal spray    Sig: Place 2 sprays into both nostrils 2 (two) times daily.    Dispense:  30 mL    Refill:  5  . metoprolol succinate (TOPROL-XL) 50 MG 24 hr tablet    Sig: Take 1 tablet (50 mg total) by mouth every evening.  Dispense:  30 tablet    Refill:  5  . metFORMIN (GLUCOPHAGE) 850 MG tablet    Sig: Take 1 tablet once a day with largest meal.    Dispense:  30 tablet    Refill:  5  '

## 2014-08-01 ENCOUNTER — Encounter: Payer: Self-pay | Admitting: Family Medicine

## 2014-09-19 ENCOUNTER — Encounter: Payer: Self-pay | Admitting: Family Medicine

## 2014-10-03 ENCOUNTER — Encounter (HOSPITAL_COMMUNITY): Payer: Self-pay | Admitting: Emergency Medicine

## 2014-10-03 ENCOUNTER — Emergency Department (HOSPITAL_COMMUNITY): Payer: Self-pay

## 2014-10-03 ENCOUNTER — Emergency Department (HOSPITAL_COMMUNITY)
Admission: EM | Admit: 2014-10-03 | Discharge: 2014-10-03 | Disposition: A | Discharge status: Home / Self Care | Payer: Self-pay | Attending: Emergency Medicine | Admitting: Emergency Medicine

## 2014-10-03 DIAGNOSIS — F419 Anxiety disorder, unspecified: Secondary | ICD-10-CM | POA: Insufficient documentation

## 2014-10-03 DIAGNOSIS — F3130 Bipolar disorder, current episode depressed, mild or moderate severity, unspecified: Secondary | ICD-10-CM | POA: Insufficient documentation

## 2014-10-03 DIAGNOSIS — Z880 Allergy status to penicillin: Secondary | ICD-10-CM | POA: Insufficient documentation

## 2014-10-03 DIAGNOSIS — R55 Syncope and collapse: Secondary | ICD-10-CM | POA: Insufficient documentation

## 2014-10-03 DIAGNOSIS — E119 Type 2 diabetes mellitus without complications: Secondary | ICD-10-CM | POA: Insufficient documentation

## 2014-10-03 DIAGNOSIS — Z79899 Other long term (current) drug therapy: Secondary | ICD-10-CM | POA: Insufficient documentation

## 2014-10-03 DIAGNOSIS — R0602 Shortness of breath: Secondary | ICD-10-CM | POA: Insufficient documentation

## 2014-10-03 DIAGNOSIS — Z3202 Encounter for pregnancy test, result negative: Secondary | ICD-10-CM | POA: Insufficient documentation

## 2014-10-03 DIAGNOSIS — R42 Dizziness and giddiness: Secondary | ICD-10-CM

## 2014-10-03 DIAGNOSIS — R631 Polydipsia: Secondary | ICD-10-CM | POA: Insufficient documentation

## 2014-10-03 DIAGNOSIS — I10 Essential (primary) hypertension: Secondary | ICD-10-CM | POA: Insufficient documentation

## 2014-10-03 LAB — CBG MONITORING, ED: GLUCOSE-CAPILLARY: 77 mg/dL (ref 70–99)

## 2014-10-03 LAB — I-STAT CHEM 8, ED
BUN: 7 mg/dL (ref 6–23)
Calcium, Ion: 1.08 mmol/L — ABNORMAL LOW (ref 1.12–1.23)
Chloride: 106 mEq/L (ref 96–112)
Creatinine, Ser: 0.5 mg/dL (ref 0.50–1.10)
Glucose, Bld: 98 mg/dL (ref 70–99)
HCT: 38 % (ref 36.0–46.0)
Hemoglobin: 12.9 g/dL (ref 12.0–15.0)
POTASSIUM: 3.4 meq/L — AB (ref 3.7–5.3)
SODIUM: 138 meq/L (ref 137–147)
TCO2: 20 mmol/L (ref 0–100)

## 2014-10-03 LAB — PRO B NATRIURETIC PEPTIDE: PRO B NATRI PEPTIDE: 56 pg/mL (ref 0–125)

## 2014-10-03 LAB — CBC
HCT: 39.8 % (ref 36.0–46.0)
Hemoglobin: 14 g/dL (ref 12.0–15.0)
MCH: 30.4 pg (ref 26.0–34.0)
MCHC: 35.2 g/dL (ref 30.0–36.0)
MCV: 86.3 fL (ref 78.0–100.0)
Platelets: 290 10*3/uL (ref 150–400)
RBC: 4.61 MIL/uL (ref 3.87–5.11)
RDW: 13.5 % (ref 11.5–15.5)
WBC: 7.9 10*3/uL (ref 4.0–10.5)

## 2014-10-03 LAB — URINALYSIS, ROUTINE W REFLEX MICROSCOPIC
Bilirubin Urine: NEGATIVE
Glucose, UA: 100 mg/dL — AB
Hgb urine dipstick: NEGATIVE
KETONES UR: NEGATIVE mg/dL
LEUKOCYTES UA: NEGATIVE
NITRITE: NEGATIVE
PROTEIN: NEGATIVE mg/dL
Specific Gravity, Urine: 1.01 (ref 1.005–1.030)
Urobilinogen, UA: 0.2 mg/dL (ref 0.0–1.0)
pH: 6.5 (ref 5.0–8.0)

## 2014-10-03 LAB — I-STAT TROPONIN, ED: TROPONIN I, POC: 0 ng/mL (ref 0.00–0.08)

## 2014-10-03 LAB — POC URINE PREG, ED: Preg Test, Ur: NEGATIVE

## 2014-10-03 MED ORDER — SODIUM CHLORIDE 0.9 % IV BOLUS (SEPSIS)
1000.0000 mL | Freq: Once | INTRAVENOUS | Status: AC
  Administered 2014-10-03: 1000 mL via INTRAVENOUS

## 2014-10-03 NOTE — ED Notes (Signed)
Pt ambulates with ease to the restroom.

## 2014-10-03 NOTE — ED Notes (Signed)
Patient transported to X-ray 

## 2014-10-03 NOTE — ED Notes (Signed)
Pt. Stated, I passed out yesterday (LOC) for about 2 hours and just feeling dizzy and weak.  I also feel some SOB.  Im a diabetic and have checked my sugar and its been ok, I've drank about 6 c. Of water and Im still thirsty.

## 2014-10-03 NOTE — ED Provider Notes (Signed)
CSN: [id number]     Arrival date & time 10/03/14  1504 History   First MD Initiated Contact with Patient 10/03/14 1531     Chief Complaint  Patient presents with  . Loss of Consciousness  . Shortness of Breath  . Polydipsia     (Consider location/radiation/quality/duration/timing/severity/associated sxs/prior Treatment) HPI Comments: Patient with past medical history of bipolar, diabetes, hypertension, and anxiety, presents to the emergency department with chief complaint of shortness of breath, dizziness, and syncope. Patient states that she has had exertional shortness of breath and dizziness since last night. She reports having an episode of "passing out" this morning. She states that she came dizzy, and lie down on her bed. She states that when she woke several hours past. She is unable to clarify as to whether she fell asleep or passed out while lying down. She denies any chest pain. She denies any nausea, or vomiting.  She states that the symptoms are improved when she is lying still. She states that she still experiences exertional shortness of breath.  The history is provided by the patient. No language interpreter was used.    Past Medical History  Diagnosis Date  . Diabetes mellitus without complication   . Bipolar depression   . Anxiety   . Hypertension   . Allergy    History reviewed. No pertinent past surgical history. Family History  Problem Relation Age of Onset  . Arthritis Mother   . Hypertension Mother   . Heart disease Mother   . Arthritis Father     rheumatoid  . Hypertension Father   . Hypertension Sister   . Thyroid disease Sister   . Congestive Heart Failure Maternal Grandfather   . Cancer Paternal Grandmother   . Heart disease Paternal Grandfather    History  Substance Use Topics  . Smoking status: Never Smoker   . Smokeless tobacco: Never Used  . Alcohol Use: No   OB History    No data available     Review of Systems  Constitutional:  Negative for fever and chills.  Respiratory: Positive for shortness of breath.   Cardiovascular: Negative for chest pain.  Gastrointestinal: Negative for nausea, vomiting, diarrhea and constipation.  Genitourinary: Negative for dysuria.  Neurological: Positive for dizziness and syncope.  All other systems reviewed and are negative.     Allergies  Penicillins  Home Medications   Prior to Admission medications   Medication Sig Start Date End Date Taking? Authorizing Provider  acetaminophen (TYLENOL) 500 MG tablet Take 1,000 mg by mouth every 6 (six) hours as needed for mild pain.   Yes Historical Provider, MD  ipratropium (ATROVENT) 0.03 % nasal spray Place 2 sprays into both nostrils 2 (two) times daily. Patient taking differently: Place 2 sprays into both nostrils as needed for rhinitis.  07/25/14  Yes Maurice March, MD  loratadine (CLARITIN) 10 MG tablet Take 1 tablet (10 mg total) by mouth daily. 04/03/14  Yes Tiffany Irine Seal, PA-C  metFORMIN (GLUCOPHAGE) 850 MG tablet Take 1 tablet once a day with largest meal. 07/25/14  Yes Maurice March, MD  metoprolol succinate (TOPROL-XL) 50 MG 24 hr tablet Take 1 tablet (50 mg total) by mouth every evening. 07/25/14  Yes Maurice March, MD  Multiple Vitamin (MULTIVITAMIN WITH MINERALS) TABS tablet Take 1 tablet by mouth daily.   Yes Historical Provider, MD  sertraline (ZOLOFT) 100 MG tablet Take 200 mg by mouth daily.    Yes Historical Provider, MD  benzonatate (  TESSALON) 100 MG capsule Take 1-2 capsules (100-200 mg total) by mouth 3 (three) times daily as needed for cough. 05/05/14   Chelle S Jeffery, PA-C  Guaifenesin (MUCINEX MAXIMUM STRENGTH) 1200 MG TB12 Take 1 tablet (1,200 mg total) by mouth every 12 (twelve) hours as needed. 05/05/14   Chelle Tessa LernerS Jeffery, PA-C  paliperidone (INVEGA) 6 MG 24 hr tablet Take 12 mg by mouth at bedtime.    Historical Provider, MD  vitamin E 400 UNIT capsule Take 400 Units by mouth daily.     Historical Provider, MD   BP 126/69 mmHg  Pulse 88  Temp(Src) 97.9 F (36.6 C) (Oral)  Resp 18  SpO2 97%  LMP 09/25/2014 Physical Exam  Constitutional: She is oriented to person, place, and time. She appears well-developed and well-nourished.  HENT:  Head: Normocephalic and atraumatic.  Eyes: Conjunctivae and EOM are normal. Pupils are equal, round, and reactive to light.  Neck: Normal range of motion. Neck supple.  Cardiovascular: Normal rate and regular rhythm.  Exam reveals no gallop and no friction rub.   No murmur heard. Pulmonary/Chest: Effort normal and breath sounds normal. No respiratory distress. She has no wheezes. She has no rales. She exhibits no tenderness.  Abdominal: Soft. Bowel sounds are normal. She exhibits no distension and no mass. There is no tenderness. There is no rebound and no guarding.  Musculoskeletal: Normal range of motion. She exhibits no edema or tenderness.  Neurological: She is alert and oriented to person, place, and time.  Skin: Skin is warm and dry.  Psychiatric: She has a normal mood and affect. Her behavior is normal. Judgment and thought content normal.  Nursing note and vitals reviewed.   ED Course  Procedures (including critical care time) Results for orders placed or performed during the hospital encounter of 10/03/14  CBC  Result Value Ref Range   WBC 7.9 4.0 - 10.5 K/uL   RBC 4.61 3.87 - 5.11 MIL/uL   Hemoglobin 14.0 12.0 - 15.0 g/dL   HCT 09.839.8 11.936.0 - 14.746.0 %   MCV 86.3 78.0 - 100.0 fL   MCH 30.4 26.0 - 34.0 pg   MCHC 35.2 30.0 - 36.0 g/dL   RDW 82.913.5 56.211.5 - 13.015.5 %   Platelets 290 150 - 400 K/uL  BNP (order ONLY if patient complains of dyspnea/SOB AND you have documented it for THIS visit)  Result Value Ref Range   Pro B Natriuretic peptide (BNP) 56.0 0 - 125 pg/mL  Urinalysis, Routine w reflex microscopic  Result Value Ref Range   Color, Urine YELLOW YELLOW   APPearance CLEAR CLEAR   Specific Gravity, Urine 1.010 1.005 - 1.030    pH 6.5 5.0 - 8.0   Glucose, UA 100 (A) NEGATIVE mg/dL   Hgb urine dipstick NEGATIVE NEGATIVE   Bilirubin Urine NEGATIVE NEGATIVE   Ketones, ur NEGATIVE NEGATIVE mg/dL   Protein, ur NEGATIVE NEGATIVE mg/dL   Urobilinogen, UA 0.2 0.0 - 1.0 mg/dL   Nitrite NEGATIVE NEGATIVE   Leukocytes, UA NEGATIVE NEGATIVE  I-stat troponin, ED ([facility])  Result Value Ref Range   Troponin i, poc 0.00 0.00 - 0.08 ng/mL   Comment 3          CBG monitoring, ED  Result Value Ref Range   Glucose-Capillary 77 70 - 99 mg/dL  POC urine preg, ED (not [facility])  Result Value Ref Range   Preg Test, Ur NEGATIVE NEGATIVE  I-stat chem 8, ed  Result Value Ref Range  Sodium 138 137 - 147 mEq/L   Potassium 3.4 (L) 3.7 - 5.3 mEq/L   Chloride 106 96 - 112 mEq/L   BUN 7 6 - 23 mg/dL   Creatinine, Ser 8.290.50 0.50 - 1.10 mg/dL   Glucose, Bld 98 70 - 99 mg/dL   Calcium, Ion 5.621.08 (L) 1.12 - 1.23 mmol/L   TCO2 20 0 - 100 mmol/L   Hemoglobin 12.9 12.0 - 15.0 g/dL   HCT 13.038.0 86.536.0 - 78.446.0 %   Dg Chest 2 View  10/03/2014   CLINICAL DATA:  Syncope for 2 days, shortness of Breath  EXAM: CHEST  2 VIEW  COMPARISON:  04/03/2014  FINDINGS: Cardiomediastinal silhouette is stable. No acute infiltrate or pleural effusion. No pulmonary edema. Mild degenerative changes thoracic spine.  IMPRESSION: No active cardiopulmonary disease.   Electronically Signed   By: Natasha MeadLiviu  Pop M.D.   On: 10/03/2014 16:42     Imaging Review No results found.   EKG Interpretation   Date/Time:  Friday October 03 2014 15:13:39 EST Ventricular Rate:  88 PR Interval:  150 QRS Duration: 76 QT Interval:  356 QTC Calculation: 430 R Axis:   106 Text Interpretation:  Normal sinus rhythm Rightward axis Nonspecific T  wave abnormality Abnormal ECG since last tracing no significant change  Confirmed by WENTZ  MD, ELLIOTT ([id number]) on 10/03/2014 6:49:02 PM      MDM   Final diagnoses:  SOB (shortness of breath)  Dizziness  Syncope, unspecified  syncope type    Patient with dizziness, shortness of breath, and syncope. We'll check basic labs, EKG, and will reassess.  Labs and EKG are reassuring. Low risk for serious outcome per [facility] Syncope Rule.  No hx of CHF, hematocrit >30%, no new EKG changes or arrhythmias, no SOB, systolic BP >90 in triage.  Patient is feeling better after fluids. Will discharged home with primary care follow-up. Symptoms could be from Toprol, encouraged patient follow-up with her primary care provider. Patient is stable and ready for discharge.  Patient discussed with Dr. Effie ShyWentz, who agrees with the plan.  Roxy Horsemanobert Niklas Chretien, PA-C 10/03/14 1859  Roxy Horsemanobert Raylea Adcox, PA-C 10/03/14 1859  Flint MelterElliott L Wentz, MD 10/03/14 [phone]

## 2014-10-03 NOTE — Discharge Instructions (Signed)
Dizziness °Dizziness is a common problem. It is a feeling of unsteadiness or light-headedness. You may feel like you are about to faint. Dizziness can lead to injury if you stumble or fall. A person of any age group can suffer from dizziness, but dizziness is more common in older adults. °CAUSES  °Dizziness can be caused by many different things, including: °· Middle ear problems. °· Standing for too long. °· Infections. °· An allergic reaction. °· Aging. °· An emotional response to something, such as the sight of blood. °· Side effects of medicines. °· Tiredness. °· Problems with circulation or blood pressure. °· Excessive use of alcohol or medicines, or illegal drug use. °· Breathing too fast (hyperventilation). °· An irregular heart rhythm (arrhythmia). °· A low red blood cell count (anemia). °· Pregnancy. °· Vomiting, diarrhea, fever, or other illnesses that cause body fluid loss (dehydration). °· Diseases or conditions such as Parkinson's disease, high blood pressure (hypertension), diabetes, and thyroid problems. °· Exposure to extreme heat. °DIAGNOSIS  °Your health care provider will ask about your symptoms, perform a physical exam, and perform an electrocardiogram (ECG) to record the electrical activity of your heart. Your health care provider may also perform other heart or blood tests to determine the cause of your dizziness. These may include: °· Transthoracic echocardiogram (TTE). During echocardiography, sound waves are used to evaluate how blood flows through your heart. °· Transesophageal echocardiogram (TEE). °· Cardiac monitoring. This allows your health care provider to monitor your heart rate and rhythm in real time. °· Holter monitor. This is a portable device that records your heartbeat and can help diagnose heart arrhythmias. It allows your health care provider to track your heart activity for several days if needed. °· Stress tests by exercise or by giving medicine that makes the heart beat  faster. °TREATMENT  °Treatment of dizziness depends on the cause of your symptoms and can vary greatly. °HOME CARE INSTRUCTIONS  °· Drink enough fluids to keep your urine clear or pale yellow. This is especially important in very hot weather. In older adults, it is also important in cold weather. °· Take your medicine exactly as directed if your dizziness is caused by medicines. When taking blood pressure medicines, it is especially important to get up slowly. °· Rise slowly from chairs and steady yourself until you feel okay. °· In the morning, first sit up on the side of the bed. When you feel okay, stand slowly while holding onto something until you know your balance is fine. °· Move your legs often if you need to stand in one place for a long time. Tighten and relax your muscles in your legs while standing. °· Have someone stay with you for 1-2 days if dizziness continues to be a problem. Do this until you feel you are well enough to stay alone. Have the person call your health care provider if he or she notices changes in you that are concerning. °· Do not drive or use heavy machinery if you feel dizzy. °· Do not drink alcohol. °SEEK IMMEDIATE MEDICAL CARE IF:  °· Your dizziness or light-headedness gets worse. °· You feel nauseous or vomit. °· You have problems talking, walking, or using your arms, hands, or legs. °· You feel weak. °· You are not thinking clearly or you have trouble forming sentences. It may take a friend or family member to notice this. °· You have chest pain, abdominal pain, shortness of breath, or sweating. °· Your vision changes. °· You notice   any bleeding. °· You have side effects from medicine that seems to be getting worse rather than better. °MAKE SURE YOU:  °· Understand these instructions. °· Will watch your condition. °· Will get help right away if you are not doing well or get worse. °Document Released: 04/19/2001 Document Revised: 10/29/2013 Document Reviewed: 05/13/2011 °ExitCare®  Patient Information ©2015 [employer]. This information is not intended to replace advice given to you by your health care provider. Make sure you discuss any questions you have with your health care provider. ° °Syncope °Syncope is a medical term for fainting or passing out. This means you lose consciousness and drop to the ground. People are generally unconscious for less than 5 minutes. You may have some muscle twitches for up to 15 seconds before waking up and returning to normal. Syncope occurs more often in older adults, but it can happen to anyone. While most causes of syncope are not dangerous, syncope can be a sign of a serious medical problem. It is important to seek medical care.  °CAUSES  °Syncope is caused by a sudden drop in blood flow to the brain. The specific cause is often not determined. Factors that can bring on syncope include: °· Taking medicines that lower blood pressure. °· Sudden changes in posture, such as standing up quickly. °· Taking more medicine than prescribed. °· Standing in one place for too long. °· Seizure disorders. °· Dehydration and excessive exposure to heat. °· Low blood sugar (hypoglycemia). °· Straining to have a bowel movement. °· Heart disease, irregular heartbeat, or other circulatory problems. °· Fear, emotional distress, seeing blood, or severe pain. °SYMPTOMS  °Right before fainting, you may: °· Feel dizzy or light-headed. °· Feel nauseous. °· See all white or all black in your field of vision. °· Have cold, clammy skin. °DIAGNOSIS  °Your health care provider will ask about your symptoms, perform a physical exam, and perform an electrocardiogram (ECG) to record the electrical activity of your heart. Your health care provider may also perform other heart or blood tests to determine the cause of your syncope which may include: °· Transthoracic echocardiogram (TTE). During echocardiography, sound waves are used to evaluate how blood flows through your  heart. °· Transesophageal echocardiogram (TEE). °· Cardiac monitoring. This allows your health care provider to monitor your heart rate and rhythm in real time. °· Holter monitor. This is a portable device that records your heartbeat and can help diagnose heart arrhythmias. It allows your health care provider to track your heart activity for several days, if needed. °· Stress tests by exercise or by giving medicine that makes the heart beat faster. °TREATMENT  °In most cases, no treatment is needed. Depending on the cause of your syncope, your health care provider may recommend changing or stopping some of your medicines. °HOME CARE INSTRUCTIONS °· Have someone stay with you until you feel stable. °· Do not drive, use machinery, or play sports until your health care provider says it is okay. °· Keep all follow-up appointments as directed by your health care provider. °· Lie down right away if you start feeling like you might faint. Breathe deeply and steadily. Wait until all the symptoms have passed. °· Drink enough fluids to keep your urine clear or pale yellow. °· If you are taking blood pressure or heart medicine, get up slowly and take several minutes to sit and then stand. This can reduce dizziness. °SEEK IMMEDIATE MEDICAL CARE IF:  °· You have a severe headache. °· You   have unusual pain in the chest, abdomen, or back. °· You are bleeding from your mouth or rectum, or you have black or tarry stool. °· You have an irregular or very fast heartbeat. °· You have pain with breathing. °· You have repeated fainting or seizure-like jerking during an episode. °· You faint when sitting or lying down. °· You have confusion. °· You have trouble walking. °· You have severe weakness. °· You have vision problems. °If you fainted, call your local emergency services (911 in [place] Do not drive yourself to the hospital.  °MAKE SURE YOU: °· Understand these instructions. °· Will watch your condition. °· Will get help right away  if you are not doing well or get worse. °Document Released: 10/24/2005 Document Revised: 10/29/2013 Document Reviewed: 12/23/2011 °[employer]® Patient Information ©2015 [employer]. This information is not intended to replace advice given to you by your health care provider. Make sure you discuss any questions you have with your health care provider. ° °

## 2014-10-03 NOTE — ED Notes (Signed)
CBG = 77  RN informed of result.

## 2014-10-15 ENCOUNTER — Encounter: Payer: Self-pay | Admitting: Family Medicine

## 2014-12-17 ENCOUNTER — Telehealth: Payer: Self-pay

## 2014-12-17 NOTE — Telephone Encounter (Signed)
Pt calling wanting to know if it is possible someone can tell her what blood type she is. States she was wondering if we had that information. CB # [phone]

## 2014-12-18 NOTE — Telephone Encounter (Signed)
We do not have this information. Left a detailed message explaining this.

## 2015-03-16 ENCOUNTER — Encounter (HOSPITAL_COMMUNITY): Payer: Self-pay | Admitting: Emergency Medicine

## 2015-03-16 ENCOUNTER — Emergency Department (HOSPITAL_COMMUNITY): Payer: Self-pay

## 2015-03-16 ENCOUNTER — Emergency Department (HOSPITAL_COMMUNITY)
Admission: EM | Admit: 2015-03-16 | Discharge: 2015-03-16 | Disposition: A | Discharge status: Home / Self Care | Payer: Self-pay | Attending: Emergency Medicine | Admitting: Emergency Medicine

## 2015-03-16 DIAGNOSIS — I10 Essential (primary) hypertension: Secondary | ICD-10-CM | POA: Insufficient documentation

## 2015-03-16 DIAGNOSIS — R0789 Other chest pain: Secondary | ICD-10-CM | POA: Insufficient documentation

## 2015-03-16 DIAGNOSIS — R000 Tachycardia, unspecified: Secondary | ICD-10-CM | POA: Insufficient documentation

## 2015-03-16 DIAGNOSIS — Z79899 Other long term (current) drug therapy: Secondary | ICD-10-CM | POA: Insufficient documentation

## 2015-03-16 DIAGNOSIS — Z880 Allergy status to penicillin: Secondary | ICD-10-CM | POA: Insufficient documentation

## 2015-03-16 DIAGNOSIS — E119 Type 2 diabetes mellitus without complications: Secondary | ICD-10-CM | POA: Insufficient documentation

## 2015-03-16 DIAGNOSIS — F419 Anxiety disorder, unspecified: Secondary | ICD-10-CM | POA: Insufficient documentation

## 2015-03-16 DIAGNOSIS — R05 Cough: Secondary | ICD-10-CM | POA: Insufficient documentation

## 2015-03-16 LAB — CBC WITH DIFFERENTIAL/PLATELET
Basophils Absolute: 0 10*3/uL (ref 0.0–0.1)
Basophils Relative: 0 % (ref 0–1)
EOS ABS: 0.1 10*3/uL (ref 0.0–0.7)
Eosinophils Relative: 1 % (ref 0–5)
HEMATOCRIT: 42.1 % (ref 36.0–46.0)
Hemoglobin: 14.8 g/dL (ref 12.0–15.0)
LYMPHS PCT: 22 % (ref 12–46)
Lymphs Abs: 2.2 10*3/uL (ref 0.7–4.0)
MCH: 30.3 pg (ref 26.0–34.0)
MCHC: 35.2 g/dL (ref 30.0–36.0)
MCV: 86.3 fL (ref 78.0–100.0)
Monocytes Absolute: 0.8 10*3/uL (ref 0.1–1.0)
Monocytes Relative: 8 % (ref 3–12)
NEUTROS ABS: 6.9 10*3/uL (ref 1.7–7.7)
Neutrophils Relative %: 69 % (ref 43–77)
Platelets: 265 10*3/uL (ref 150–400)
RBC: 4.88 MIL/uL (ref 3.87–5.11)
RDW: 13.3 % (ref 11.5–15.5)
WBC: 10 10*3/uL (ref 4.0–10.5)

## 2015-03-16 LAB — COMPREHENSIVE METABOLIC PANEL
ALK PHOS: 74 U/L (ref 38–126)
ALT: 44 U/L (ref 14–54)
ANION GAP: 11 (ref 5–15)
AST: 33 U/L (ref 15–41)
Albumin: 4.3 g/dL (ref 3.5–5.0)
BUN: 8 mg/dL (ref 6–20)
CO2: 23 mmol/L (ref 22–32)
Calcium: 10 mg/dL (ref 8.9–10.3)
Chloride: 102 mmol/L (ref 101–111)
Creatinine, Ser: 0.59 mg/dL (ref 0.44–1.00)
GFR calc non Af Amer: >60 mL/min (ref >60)
GLUCOSE: 139 mg/dL — AB (ref 70–99)
Potassium: 3.7 mmol/L (ref 3.5–5.1)
Sodium: 136 mmol/L (ref 135–145)
TOTAL PROTEIN: 7.4 g/dL (ref 6.5–8.1)
Total Bilirubin: 1.1 mg/dL (ref 0.3–1.2)

## 2015-03-16 LAB — I-STAT TROPONIN, ED: TROPONIN I, POC: 0 ng/mL (ref 0.00–0.08)

## 2015-03-16 LAB — D-DIMER, QUANTITATIVE: D-Dimer, Quant: <0.27 ug/mL-FEU (ref 0.00–0.48)

## 2015-03-16 MED ORDER — HYDROCODONE-ACETAMINOPHEN 5-325 MG PO TABS: 1.0000 | ORAL_TABLET | Freq: Four times a day (QID) | ORAL | Status: DC | PRN

## 2015-03-16 MED ORDER — HYDROCODONE-ACETAMINOPHEN 7.5-325 MG/15ML PO SOLN
10.0000 mL | Freq: Once | ORAL | Status: AC
  Administered 2015-03-16: 10 mL via ORAL
  Filled 2015-03-16: qty 15

## 2015-03-16 NOTE — ED Notes (Signed)
Pt sts right sided back pain worse with inspiration and cough x 2 days

## 2015-03-16 NOTE — ED Provider Notes (Signed)
CSN: [id number]     Arrival date & time 03/16/15  1652 History   First MD Initiated Contact with Patient 03/16/15 2018     Chief Complaint  Patient presents with  . Back Pain     (Consider location/radiation/quality/duration/timing/severity/associated sxs/prior Treatment) HPI Comments: Patient is a 44 year old female past medical history significant for tachycardia, hypertension, bipolar disorder, DM presenting to the emergency department for right-sided lateral chest pain that began days ago. She states her pain is worse with inspiration and cough. Denies any shortness of breath, fever, chills, nausea, vomiting, abdominal pain, diarrhea. Patient is tried Tylenol no improvement. No modifying factors identified. No history of DVT/PE. No early familial cardia history of stress test, echocardiogram or cardiac catheterization.c history. No   Patient is a 44 y.o. female presenting with back pain.  Back Pain Associated symptoms: chest pain     Past Medical History  Diagnosis Date  . Diabetes mellitus without complication   . Bipolar depression   . Anxiety   . Hypertension   . Allergy    History reviewed. No pertinent past surgical history. Family History  Problem Relation Age of Onset  . Arthritis Mother   . Hypertension Mother   . Heart disease Mother   . Arthritis Father     rheumatoid  . Hypertension Father   . Hypertension Sister   . Thyroid disease Sister   . Congestive Heart Failure Maternal Grandfather   . Cancer Paternal Grandmother   . Heart disease Paternal Grandfather    History  Substance Use Topics  . Smoking status: Never Smoker   . Smokeless tobacco: Never Used  . Alcohol Use: No   OB History    No data available     Review of Systems  Respiratory: Positive for cough. Negative for shortness of breath.   Cardiovascular: Positive for chest pain. Negative for palpitations and leg swelling.  Gastrointestinal: Negative for vomiting and diarrhea.   Musculoskeletal: Positive for back pain.      Allergies  Tomato and Penicillins  Home Medications   Prior to Admission medications   Medication Sig Start Date End Date Taking? Authorizing Provider  acetaminophen (TYLENOL) 500 MG tablet Take 1,000 mg by mouth every 6 (six) hours as needed for mild pain.   Yes Historical Provider, MD  Ginkgo Biloba (GINKOBA PO) Take 1 tablet by mouth 2 (two) times daily.   Yes Historical Provider, MD  Guaifenesin (MUCINEX MAXIMUM STRENGTH) 1200 MG TB12 Take 1 tablet (1,200 mg total) by mouth every 12 (twelve) hours as needed. Patient taking differently: Take 1 tablet by mouth every 12 (twelve) hours as needed. For congestion 05/05/14  Yes Chelle S Jeffery, PA-C  ipratropium (ATROVENT) 0.03 % nasal spray Place 2 sprays into both nostrils 2 (two) times daily. Patient taking differently: Place 2 sprays into both nostrils as needed for rhinitis.  07/25/14  Yes Maurice MarchBarbara B McPherson, MD  loratadine (CLARITIN) 10 MG tablet Take 1 tablet (10 mg total) by mouth daily. 04/03/14  Yes Tiffany Neva SeatGreene, PA-C  metFORMIN (GLUCOPHAGE) 850 MG tablet Take 1 tablet once a day with largest meal. Patient taking differently: Take 850 mg by mouth daily. Take 1 tablet once a day with largest meal. 07/25/14  Yes Maurice MarchBarbara B McPherson, MD  metoprolol succinate (TOPROL-XL) 50 MG 24 hr tablet Take 1 tablet (50 mg total) by mouth every evening. 07/25/14  Yes Maurice MarchBarbara B McPherson, MD  Multiple Vitamin (MULTIVITAMIN WITH MINERALS) TABS tablet Take 1 tablet by mouth daily.  Yes Historical Provider, MD  paliperidone (INVEGA) 6 MG 24 hr tablet Take 12 mg by mouth at bedtime.   Yes Historical Provider, MD  sertraline (ZOLOFT) 100 MG tablet Take 200 mg by mouth daily.    Yes Historical Provider, MD  vitamin E 400 UNIT capsule Take 400 Units by mouth daily.   Yes Historical Provider, MD  HYDROcodone-acetaminophen (NORCO/VICODIN) 5-325 MG per tablet Take 1-2 tablets by mouth every 6 (six) hours as  needed. 03/16/15   Riki Berninger, PA-C   BP 133/59 mmHg  Pulse 109  Temp(Src) 98.3 F (36.8 C) (Oral)  Resp 18  SpO2 97% Physical Exam  Constitutional: She is oriented to person, place, and time. She appears well-developed and well-nourished. No distress.  HENT:  Head: Normocephalic and atraumatic.  Right Ear: External ear normal.  Left Ear: External ear normal.  Nose: Nose normal.  Mouth/Throat: Oropharynx is clear and moist.  Eyes: Conjunctivae are normal.  Neck: Normal range of motion. Neck supple.  No nuchal rigidity.   Cardiovascular: Regular rhythm and normal heart sounds.  Tachycardia present.   Pulmonary/Chest: Effort normal.  Abdominal: Soft. There is no tenderness.  Musculoskeletal: Normal range of motion. She exhibits no edema.  Neurological: She is alert and oriented to person, place, and time.  Skin: Skin is warm and dry. She is not diaphoretic.  Psychiatric: She has a normal mood and affect.  Nursing note and vitals reviewed.   ED Course  Procedures (including critical care time) Medications  HYDROcodone-acetaminophen (HYCET) 7.5-325 mg/15 ml solution 10 mL (10 mLs Oral Given 03/16/15 2100)    Labs Review Labs Reviewed  COMPREHENSIVE METABOLIC PANEL - Abnormal; Notable for the following:    Glucose, Bld 139 (*)    All other components within normal limits  CBC WITH DIFFERENTIAL/PLATELET  D-DIMER, QUANTITATIVE  Rose[facility] SensorI-STAT TROPOININ, ED    Imaging Review Dg Chest 2 View  03/16/2015   CLINICAL DATA:  Right-sided back pain. Worse with inspiration and cough  EXAM: CHEST  2 VIEW  COMPARISON:  10/03/2014  FINDINGS: The heart size and mediastinal contours are within normal limits. Both lungs are clear. The visualized skeletal structures are unremarkable.  IMPRESSION: No active cardiopulmonary disease.   Electronically Signed   By: Signa Kellaylor  Stroud M.D.   On: 03/16/2015 18:13     EKG Interpretation None      MDM   Final diagnoses:  Right-sided chest wall  pain    Filed Vitals:   03/16/15 2315  BP: 133/59  Pulse: 109  Temp:   Resp:    Afebrile, NAD, non-toxic appearing, AAOx4.  I have reviewed nursing notes, vital signs, and all appropriate lab and imaging results if ordered as above.  Patient is to be discharged with recommendation to follow up with PCP in regards to today's hospital visit. Chest pain is not likely of cardiac or pulmonary etiology d/t presentation, negative d-dimer with low clinical suspicion, VSS, no tracheal deviation, no JVD or new murmur, RRR, breath sounds equal bilaterally, EKG without acute abnormalities, negative troponin, and negative CXR. Pt has been advised to return to the ED is CP becomes exertional, associated with diaphoresis or nausea, radiates to left jaw/arm, worsens or becomes concerning in any way. Pt appears reliable for follow up and is agreeable to discharge.       Francee PiccoloJennifer Lawayne Hartig, PA-C 03/17/15 0140  Tilden FossaElizabeth Rees, MD 03/19/15 [phone]

## 2015-03-16 NOTE — ED Notes (Signed)
Pt. Left with all belongings 

## 2015-03-16 NOTE — Discharge Instructions (Signed)
Please follow up with your primary care physician in 1-2 days. If you do not have one please call the [facility] number listed above. Please take pain medication and/or muscle relaxants as prescribed and as needed for pain. Please do not drive on narcotic pain medication or on muscle relaxants. Please read all discharge instructions and return precautions.  ° ° °Chest Wall Pain °Chest wall pain is pain in or around the bones and muscles of your chest. It may take up to 6 weeks to get better. It may take longer if you must stay physically active in your work and activities.  °CAUSES  °Chest wall pain may happen on its own. However, it may be caused by: °· A viral illness like the flu. °· Injury. °· Coughing. °· Exercise. °· Arthritis. °· Fibromyalgia. °· Shingles. °HOME CARE INSTRUCTIONS  °· Avoid overtiring physical activity. Try not to strain or perform activities that cause pain. This includes any activities using your chest or your abdominal and side muscles, especially if heavy weights are used. °· Put ice on the sore area. °¨ Put ice in a plastic bag. °¨ Place a towel between your skin and the bag. °¨ Leave the ice on for 15-20 minutes per hour while awake for the first 2 days. °· Only take over-the-counter or prescription medicines for pain, discomfort, or fever as directed by your caregiver. °SEEK IMMEDIATE MEDICAL CARE IF:  °· Your pain increases, or you are very uncomfortable. °· You have a fever. °· Your chest pain becomes worse. °· You have new, unexplained symptoms. °· You have nausea or vomiting. °· You feel sweaty or lightheaded. °· You have a cough with phlegm (sputum), or you cough up blood. °MAKE SURE YOU:  °· Understand these instructions. °· Will watch your condition. °· Will get help right away if you are not doing well or get worse. °Document Released: 10/24/2005 Document Revised: 01/16/2012 Document Reviewed: 06/20/2011 °[employer]® Patient Information ©2015 [employer].  This information is not intended to replace advice given to you by your health care provider. Make sure you discuss any questions you have with your health care provider. ° °

## 2015-04-03 ENCOUNTER — Other Ambulatory Visit: Payer: Self-pay | Admitting: Family Medicine

## 2015-04-03 ENCOUNTER — Telehealth: Payer: Self-pay | Admitting: *Deleted

## 2015-04-03 NOTE — Telephone Encounter (Signed)
LEFT MESSAGE NEED TO KNOW IF HE HAS BEEN TAKING METFORMIN LOOKS LIKE HIS LAT REFILL WAS BACK IN MARCH?  HE WILL NEED TO COME IN FOR A FOLLOW UP APPT.

## 2015-08-28 ENCOUNTER — Ambulatory Visit (INDEPENDENT_AMBULATORY_CARE_PROVIDER_SITE_OTHER): Payer: Self-pay | Admitting: Family Medicine

## 2015-08-28 VITALS — BP 122/72 | HR 129 | Temp 98.8°F | Resp 17 | Ht 62.5 in | Wt 173.0 lb

## 2015-08-28 DIAGNOSIS — R5383 Other fatigue: Secondary | ICD-10-CM

## 2015-08-28 DIAGNOSIS — R739 Hyperglycemia, unspecified: Secondary | ICD-10-CM

## 2015-08-28 DIAGNOSIS — N3090 Cystitis, unspecified without hematuria: Secondary | ICD-10-CM

## 2015-08-28 DIAGNOSIS — R350 Frequency of micturition: Secondary | ICD-10-CM

## 2015-08-28 DIAGNOSIS — R000 Tachycardia, unspecified: Secondary | ICD-10-CM

## 2015-08-28 LAB — POCT CBC
Granulocyte percent: 62.3 %G (ref 37–80)
HCT, POC: 38.2 % (ref 37.7–47.9)
Hemoglobin: 13.4 g/dL (ref 12.2–16.2)
Lymph, poc: 1.6 (ref 0.6–3.4)
MCH, POC: 30.3 pg (ref 27–31.2)
MCHC: 35.1 g/dL (ref 31.8–35.4)
MCV: 86.5 fL (ref 80–97)
MID (CBC): 0.5 (ref 0–0.9)
MPV: 5.9 fL (ref 0–99.8)
PLATELET COUNT, POC: 229 10*3/uL (ref 142–424)
POC Granulocyte: 3.6 (ref 2–6.9)
POC LYMPH %: 28.9 % (ref 10–50)
POC MID %: 8.8 % (ref 0–12)
RBC: 4.42 M/uL (ref 4.04–5.48)
RDW, POC: 13.2 %
WBC: 5.7 10*3/uL (ref 4.6–10.2)

## 2015-08-28 LAB — POCT URINALYSIS DIP (MANUAL ENTRY)
BILIRUBIN UA: NEGATIVE
Blood, UA: NEGATIVE
Glucose, UA: 100 — AB
Ketones, POC UA: NEGATIVE
NITRITE UA: NEGATIVE
Protein Ur, POC: NEGATIVE
Spec Grav, UA: 1.01
UROBILINOGEN UA: 0.2
pH, UA: 6

## 2015-08-28 LAB — POC MICROSCOPIC URINALYSIS (UMFC): Mucus: ABSENT

## 2015-08-28 LAB — POCT GLYCOSYLATED HEMOGLOBIN (HGB A1C): HEMOGLOBIN A1C: 6.5

## 2015-08-28 LAB — BASIC METABOLIC PANEL
BUN: 9 mg/dL (ref 7–25)
CHLORIDE: 102 mmol/L (ref 98–110)
CO2: 22 mmol/L (ref 20–31)
Calcium: 9.3 mg/dL (ref 8.6–10.2)
Creat: 0.52 mg/dL (ref 0.50–1.10)
GLUCOSE: 207 mg/dL — AB (ref 65–99)
Potassium: 3.5 mmol/L (ref 3.5–5.3)
SODIUM: 136 mmol/L (ref 135–146)

## 2015-08-28 LAB — HEMOGLOBIN A1C: HEMOGLOBIN A1C: 6.5 % — AB (ref 4.0–6.0)

## 2015-08-28 LAB — TSH: TSH: 2.181 u[IU]/mL (ref 0.350–4.500)

## 2015-08-28 LAB — GLUCOSE, POCT (MANUAL RESULT ENTRY): POC GLUCOSE: 218 mg/dL — AB (ref 70–99)

## 2015-08-28 MED ORDER — NITROFURANTOIN MONOHYD MACRO 100 MG PO CAPS: 100.0000 mg | ORAL_CAPSULE | Freq: Two times a day (BID) | ORAL | Status: DC

## 2015-08-28 NOTE — Progress Notes (Addendum)
Subjective:    Patient ID: Elizabeth [name], female    DOB: [birth year], 44 y.o.   MRN: [id number] This chart was scribed for Elizabeth StaggersJeffrey Vaunda Gutterman, MD by Elizabeth [name], Medical Scribe. This patient was seen in Room 14 and the patient's care was started at 9:02 AM.    HPI HPI Comments: Elizabeth DrummerBillie J Natividad is a 44 y.o. female with a history of DM, bipolar depression, hypertension who presents to the [facility] complaining of fatigue over the past week. Last seen by Dr. Audria NineMcPherson over a year ago. At that time, her A1c was 7.3. She is on metformin 850 mg BID currently. Patient notes that she has been sleeping more than normal; she slept most of the day yesterday. She missed work yesterday as a result. She reports having associated increased thirst and increased urination. She has had some mild blurring of vision, but she attributes this to being fatigued. Patient notes that she had a fever of 101 F last week for a few days, but no fever this past week. She also had right-sided abdominal pain earlier this week, but none today. She believes her fatigue may be related to her diabetes or recently cutting back on caffeine. Patient had been drinking about 44 oz of Diet Pepsi a day, but she recently cut back to a 12 oz can a day starting 1 week ago. She was previously on metformin 850 mg qd, but she ran out of the medication from the end of this past May until mid-August. When she got it refilled, she was increased to metformin 850 mg BID which she has been taking. However, she missed 4 days of the metformin last week because she ran out and had not refilled it for a few days. Her blood sugar was 303 in the mid-afternoon 2 days ago. She has only been periodically measuring her blood sugar due to the cost of the test strips. Patient denies night sweats, fever, unexplained weight loss, chest pain, SOB, lightheadedness, dizziness, nausea, vomiting, slurred speech, and stroke-like symptoms.   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  Patient Active Problem List   Diagnosis Date Noted  . Environmental allergies 07/25/2014  . Headache, common migraine (occasionally w/ aura) 04/25/2013  . Type II or unspecified type diabetes mellitus without mention of complication, not stated as uncontrolled 09/26/2012  . HTN (hypertension) 09/26/2012  . Overweight (BMI 25.0-29.9) 09/26/2012  . Metabolic syndrome 09/26/2012  . Bipolar depression (HCC)    Past Medical History  Diagnosis Date  . Diabetes mellitus without complication (HCC)   . Bipolar depression (HCC)   . Anxiety   . Hypertension   . Allergy    No past surgical history on file. Allergies  Allergen Reactions  . Tomato     Throat break out   . Penicillins Other (See Comments)    Childhood reaction   Prior to Admission medications   Medication Sig Start Date End Date Taking? Authorizing Provider  acetaminophen (TYLENOL) 500 MG tablet Take 1,000 mg by mouth every 6 (six) hours as needed for mild pain.   Yes Historical Provider, MD  Ginkgo Biloba (GINKOBA PO) Take 1 tablet by mouth 2 (two) times daily.   Yes Historical Provider, MD  Guaifenesin (MUCINEX MAXIMUM STRENGTH) 1200 MG TB12 Take 1 tablet (1,200 mg total) by mouth every 12 (twelve) hours as needed. Patient taking differently: Take 1 tablet by mouth every 12 (twelve) hours as needed. For congestion 05/05/14  Yes Elizabeth Jeffery, PA-C  loratadine (CLARITIN) 10 MG tablet Take 1 tablet (10 mg total) by mouth daily. 04/03/14  Yes Elizabeth Neva Seat, PA-C  metFORMIN (GLUCOPHAGE) 850 MG tablet Take 1 tablet once a day with largest meal. Patient taking differently: Take 850 mg by mouth daily. Take 1 tablet once a day with largest meal. 07/25/14  Yes Elizabeth March, MD  metoprolol succinate (TOPROL-XL) 50 MG 24 hr tablet Take 1 tablet (50 mg total) by mouth every evening. 07/25/14  Yes Elizabeth March, MD  Multiple Vitamin (MULTIVITAMIN WITH MINERALS) TABS tablet Take 1 tablet  by mouth daily.   Yes Historical Provider, MD  paliperidone (INVEGA) 6 MG 24 hr tablet Take 12 mg by mouth at bedtime.   Yes Historical Provider, MD  sertraline (ZOLOFT) 100 MG tablet Take 200 mg by mouth daily.    Yes Historical Provider, MD  vitamin E 400 UNIT capsule Take 400 Units by mouth daily.   Yes Historical Provider, MD  HYDROcodone-acetaminophen (NORCO/VICODIN) 5-325 MG per tablet Take 1-2 tablets by mouth every 6 (six) hours as needed. Patient not taking: Reported on 08/28/2015 03/16/15   Elizabeth Piccolo, PA-C  ipratropium (ATROVENT) 0.03 % nasal spray Place 2 sprays into both nostrils 2 (two) times daily. Patient not taking: Reported on 08/28/2015 07/25/14   Elizabeth March, MD   Social History   Social History  . Marital Status: Single    Spouse Name: N/A  . Number of Children: 0  . Years of Education: N/A   Occupational History  . cashier-part time     [employer]  .    . student     medical office administration [employer]   Social History Main Topics  . Smoking status: Never Smoker   . Smokeless tobacco: Never Used  . Alcohol Use: No  . Drug Use: No  . Sexual Activity: Not on file     Comment: number of sex partners in the last 12 months  0   Other Topics Concern  . Not on file   Social History Narrative   Lives alone. Sister lives in [place].     Review of Systems  Constitutional: Positive for fatigue. Negative for fever, diaphoresis and unexpected weight change.  Eyes: Positive for visual disturbance.  Respiratory: Negative for shortness of breath.   Cardiovascular: Negative for chest pain.  Gastrointestinal: Positive for abdominal pain. Negative for nausea and vomiting.  Endocrine: Positive for polydipsia.  Genitourinary: Positive for frequency.  Neurological: Negative for dizziness, speech difficulty, weakness and light-headedness.       Objective:   Physical Exam  Constitutional: She is oriented to person, place, and time. She appears  well-developed and well-nourished. No distress.  HENT:  Head: Normocephalic and atraumatic.  Mouth/Throat: Oropharynx is clear and moist. No oropharyngeal exudate.  Eyes: EOM are normal. Pupils are equal, round, and reactive to light. Right eye exhibits no nystagmus. Left eye exhibits no nystagmus.  Neck: Neck supple.  Cardiovascular: Tachycardia present.   No murmur heard. Pulmonary/Chest: Effort normal and breath sounds normal. No respiratory distress. She has no wheezes. She has no rales.  Clear to auscultation bilaterally.   Musculoskeletal: She exhibits no edema.  Neurological: She is alert and oriented to person, place, and time. No cranial nerve deficit. She displays a negative Romberg sign.  Normal heel to toe. No pronator drift.  Skin: Skin is warm and dry. No rash noted.  Psychiatric: She has a normal mood and affect. Her behavior is normal.  Nursing note and vitals reviewed.     Filed Vitals:   08/28/15 0844  BP: 122/72  Pulse: 129  Temp: 98.8 F (37.1 C)  TempSrc: Oral  Resp: 17  Height: 5' 2.5" (1.588 m)  Weight: 173 lb (78.472 kg)  SpO2: 97%    Results for orders placed or performed in visit on 08/28/15  POCT glucose (manual entry)  Result Value Ref Range   POC Glucose 218 (A) 70 - 99 mg/dl  POCT glycosylated hemoglobin (Hb A1C)  Result Value Ref Range   Hemoglobin A1C 6.5   POCT CBC  Result Value Ref Range   WBC 5.7 4.6 - 10.2 K/uL   Lymph, poc 1.6 0.6 - 3.4   POC LYMPH PERCENT 28.9 10 - 50 %L   MID (cbc) 0.5 0 - 0.9   POC MID % 8.8 0 - 12 %M   POC Granulocyte 3.6 2 - 6.9   Granulocyte percent 62.3 37 - 80 %G   RBC 4.42 4.04 - 5.48 M/uL   Hemoglobin 13.4 12.2 - 16.2 g/dL   HCT, POC 16.1 09.6 - 47.9 %   MCV 86.5 80 - 97 fL   MCH, POC 30.3 27 - 31.2 pg   MCHC 35.1 31.8 - 35.4 g/dL   RDW, POC 04.5 %   Platelet Count, POC 229 142 - 424 K/uL   MPV 5.9 0 - 99.8 fL  POCT urinalysis dipstick  Result Value Ref Range   Color, UA yellow yellow    Clarity, UA clear clear   Glucose, UA =100 (A) negative   Bilirubin, UA negative negative   Ketones, POC UA negative negative   Spec Grav, UA 1.010    Blood, UA negative negative   pH, UA 6.0    Protein Ur, POC negative negative   Urobilinogen, UA 0.2    Nitrite, UA Negative Negative   Leukocytes, UA small (1+) (A) Negative   EKG: sinus tachycardia, rate 101. No acute findings otherwise.  Orthostatic VS for the past 24 hrs:  BP- Lying Pulse- Lying BP- Sitting Pulse- Sitting BP- Standing at 0 minutes Pulse- Standing at 0 minutes  08/28/15 1004 140/87 mmHg 102 140/81 mmHg 111 140/82 mmHg 112        Assessment & Plan:   NEOMIA HERBEL is a 44 y.o. female Other fatigue - Plan: POCT CBC, Basic metabolic panel, EKG 12-Lead, TSH  Hyperglycemia - Plan: POCT glucose (manual entry), POCT glycosylated hemoglobin (Hb A1C)  Urinary frequency - Plan: POCT urinalysis dipstick, POCT Microscopic Urinalysis ([facility])  Tachycardia - Plan: EKG 12-Lead, TSH  Cystitis - Plan: nitrofurantoin, macrocrystal-monohydrate, (MACROBID) 100 MG capsule, Urine culture  Possible UTI as cause of fatigue. 3 month DM average ok even though slightly elevated in office.   -start macrobid, urine culture, BMP, TSH for further eval of fatigue, but should RTC if not improving in next 3-4 days. Sooner or to ER if worse.   Meds ordered this encounter  Medications  . nitrofurantoin, macrocrystal-monohydrate, (MACROBID) 100 MG capsule    Sig: Take 1 capsule (100 mg total) by mouth 2 (two) times daily.    Dispense:  14 capsule    Refill:  0   Patient Instructions  Your blood sugar is a little elevated in office, but overall 3 month control is ok at current dose of metformin. Blood counts appear to be ok.  Urine test indicates possible infection - start antibiotic and we will check urine culture.  I will also check a  thyroid and electrolyte tests for your fatigue.  If you are not improving in next 3-4 days - recheck to  look into other causes of fatigue. Return to the clinic or go to the nearest emergency room if any of your symptoms worsen or new symptoms occur.  Fatigue Fatigue is feeling tired all of the time, a lack of energy, or a lack of motivation. Occasional or mild fatigue is often a normal response to activity or life in general. However, long-lasting (chronic) or extreme fatigue may indicate an underlying medical condition. HOME CARE INSTRUCTIONS  Watch your fatigue for any changes. The following actions may help to lessen any discomfort you are feeling:  Talk to your health care provider about how much sleep you need each night. Try to get the required amount every night.  Take medicines only as directed by your health care provider.  Eat a healthy and nutritious diet. Ask your health care provider if you need help changing your diet.  Drink enough fluid to keep your urine clear or pale yellow.  Practice ways of relaxing, such as yoga, meditation, massage therapy, or acupuncture.  Exercise regularly.   Change situations that cause you stress. Try to keep your work and personal routine reasonable.  Do not abuse illegal drugs.  Limit alcohol intake to no more than 1 drink per day for nonpregnant women and 2 drinks per day for men. One drink equals 12 ounces of beer, 5 ounces of wine, or 1 ounces of hard liquor.  Take a multivitamin, if directed by your health care provider. SEEK MEDICAL CARE IF:   Your fatigue does not get better.  You have a fever.   You have unintentional weight loss or gain.  You have headaches.   You have difficulty:   Falling asleep.  Sleeping throughout the night.  You feel angry, guilty, anxious, or sad.   You are unable to have a bowel movement (constipation).   You skin is dry.   Your legs or another part of your body is swollen.  SEEK IMMEDIATE MEDICAL CARE IF:   You feel confused.   Your vision is blurry.  You feel faint or pass  out.   You have a severe headache.   You have severe abdominal, pelvic, or back pain.   You have chest pain, shortness of breath, or an irregular or fast heartbeat.   You are unable to urinate or you urinate less than normal.   You develop abnormal bleeding, such as bleeding from the rectum, vagina, nose, lungs, or nipples.  You vomit blood.   You have thoughts about harming yourself or committing suicide.   You are worried that you might harm someone else.    This information is not intended to replace advice given to you by your health care provider. Make sure you discuss any questions you have with your health care provider.   Document Released: 08/21/2007 Document Revised: 11/14/2014 Document Reviewed: 02/25/2014 Elsevier Interactive Patient Education 2016 Elsevier Inc.   Urinary Tract Infection Urinary tract infections (UTIs) can develop anywhere along your urinary tract. Your urinary tract is your body's drainage system for removing wastes and extra water. Your urinary tract includes two kidneys, two ureters, a bladder, and a urethra. Your kidneys are a pair of bean-shaped organs. Each kidney is about the size of your fist. They are located below your ribs, one on each side of your spine. CAUSES Infections are caused by microbes, which are microscopic organisms, including fungi, viruses,  and bacteria. These organisms are so small that they can only be seen through a microscope. Bacteria are the microbes that most commonly cause UTIs. SYMPTOMS  Symptoms of UTIs may vary by age and gender of the patient and by the location of the infection. Symptoms in young women typically include a frequent and intense urge to urinate and a painful, burning feeling in the bladder or urethra during urination. Older women and men are more likely to be tired, shaky, and weak and have muscle aches and abdominal pain. A fever may mean the infection is in your kidneys. Other symptoms of a  kidney infection include pain in your back or sides below the ribs, nausea, and vomiting. DIAGNOSIS To diagnose a UTI, your caregiver will ask you about your symptoms. Your caregiver will also ask you to provide a urine sample. The urine sample will be tested for bacteria and white blood cells. White blood cells are made by your body to help fight infection. TREATMENT  Typically, UTIs can be treated with medication. Because most UTIs are caused by a bacterial infection, they usually can be treated with the use of antibiotics. The choice of antibiotic and length of treatment depend on your symptoms and the type of bacteria causing your infection. HOME CARE INSTRUCTIONS  If you were prescribed antibiotics, take them exactly as your caregiver instructs you. Finish the medication even if you feel better after you have only taken some of the medication.  Drink enough water and fluids to keep your urine clear or pale yellow.  Avoid caffeine, tea, and carbonated beverages. They tend to irritate your bladder.  Empty your bladder often. Avoid holding urine for long periods of time.  Empty your bladder before and after sexual intercourse.  After a bowel movement, women should cleanse from front to back. Use each tissue only once. SEEK MEDICAL CARE IF:   You have back pain.  You develop a fever.  Your symptoms do not begin to resolve within 3 days. SEEK IMMEDIATE MEDICAL CARE IF:   You have severe back pain or lower abdominal pain.  You develop chills.  You have nausea or vomiting.  You have continued burning or discomfort with urination. MAKE SURE YOU:   Understand these instructions.  Will watch your condition.  Will get help right away if you are not doing well or get worse.   This information is not intended to replace advice given to you by your health care provider. Make sure you discuss any questions you have with your health care provider.   Document Released: 08/03/2005  Document Revised: 07/15/2015 Document Reviewed: 12/02/2011 Elsevier Interactive Patient Education [employer]     I personally performed the services described in this documentation, which was scribed in my presence. The recorded information has been reviewed and considered, and addended by me as needed.    By signing my name below, I, Elizabeth Deeds, attest that this documentation has been prepared under the direction and in the presence of Elizabeth Staggers, MD.  Electronically Signed: Littie Deeds, Medical Scribe. 08/28/2015. 9:02 AM.

## 2015-08-28 NOTE — Patient Instructions (Signed)
Your blood sugar is a little elevated in office, but overall 3 month control is ok at current dose of metformin. Blood counts appear to be ok.  Urine test indicates possible infection - start antibiotic and we will check urine culture.  I will also check a thyroid and electrolyte tests for your fatigue.  If you are not improving in next 3-4 days - recheck to look into other causes of fatigue. Return to the clinic or go to the nearest emergency room if any of your symptoms worsen or new symptoms occur.  Fatigue Fatigue is feeling tired all of the time, a lack of energy, or a lack of motivation. Occasional or mild fatigue is often a normal response to activity or life in general. However, long-lasting (chronic) or extreme fatigue may indicate an underlying medical condition. HOME CARE INSTRUCTIONS  Watch your fatigue for any changes. The following actions may help to lessen any discomfort you are feeling:  Talk to your health care provider about how much sleep you need each night. Try to get the required amount every night.  Take medicines only as directed by your health care provider.  Eat a healthy and nutritious diet. Ask your health care provider if you need help changing your diet.  Drink enough fluid to keep your urine clear or pale yellow.  Practice ways of relaxing, such as yoga, meditation, massage therapy, or acupuncture.  Exercise regularly.   Change situations that cause you stress. Try to keep your work and personal routine reasonable.  Do not abuse illegal drugs.  Limit alcohol intake to no more than 1 drink per day for nonpregnant women and 2 drinks per day for men. One drink equals 12 ounces of beer, 5 ounces of wine, or 1 ounces of hard liquor.  Take a multivitamin, if directed by your health care provider. SEEK MEDICAL CARE IF:   Your fatigue does not get better.  You have a fever.   You have unintentional weight loss or gain.  You have headaches.   You have  difficulty:   Falling asleep.  Sleeping throughout the night.  You feel angry, guilty, anxious, or sad.   You are unable to have a bowel movement (constipation).   You skin is dry.   Your legs or another part of your body is swollen.  SEEK IMMEDIATE MEDICAL CARE IF:   You feel confused.   Your vision is blurry.  You feel faint or pass out.   You have a severe headache.   You have severe abdominal, pelvic, or back pain.   You have chest pain, shortness of breath, or an irregular or fast heartbeat.   You are unable to urinate or you urinate less than normal.   You develop abnormal bleeding, such as bleeding from the rectum, vagina, nose, lungs, or nipples.  You vomit blood.   You have thoughts about harming yourself or committing suicide.   You are worried that you might harm someone else.    This information is not intended to replace advice given to you by your health care provider. Make sure you discuss any questions you have with your health care provider.   Document Released: 08/21/2007 Document Revised: 11/14/2014 Document Reviewed: 02/25/2014 Elsevier Interactive Patient Education 2016 Elsevier Inc.   Urinary Tract Infection Urinary tract infections (UTIs) can develop anywhere along your urinary tract. Your urinary tract is your body's drainage system for removing wastes and extra water. Your urinary tract includes two kidneys, two ureters, a  bladder, and a urethra. Your kidneys are a pair of bean-shaped organs. Each kidney is about the size of your fist. They are located below your ribs, one on each side of your spine. CAUSES Infections are caused by microbes, which are microscopic organisms, including fungi, viruses, and bacteria. These organisms are so small that they can only be seen through a microscope. Bacteria are the microbes that most commonly cause UTIs. SYMPTOMS  Symptoms of UTIs may vary by age and gender of the patient and by the  location of the infection. Symptoms in young women typically include a frequent and intense urge to urinate and a painful, burning feeling in the bladder or urethra during urination. Older women and men are more likely to be tired, shaky, and weak and have muscle aches and abdominal pain. A fever may mean the infection is in your kidneys. Other symptoms of a kidney infection include pain in your back or sides below the ribs, nausea, and vomiting. DIAGNOSIS To diagnose a UTI, your caregiver will ask you about your symptoms. Your caregiver will also ask you to provide a urine sample. The urine sample will be tested for bacteria and white blood cells. White blood cells are made by your body to help fight infection. TREATMENT  Typically, UTIs can be treated with medication. Because most UTIs are caused by a bacterial infection, they usually can be treated with the use of antibiotics. The choice of antibiotic and length of treatment depend on your symptoms and the type of bacteria causing your infection. HOME CARE INSTRUCTIONS  If you were prescribed antibiotics, take them exactly as your caregiver instructs you. Finish the medication even if you feel better after you have only taken some of the medication.  Drink enough water and fluids to keep your urine clear or pale yellow.  Avoid caffeine, tea, and carbonated beverages. They tend to irritate your bladder.  Empty your bladder often. Avoid holding urine for long periods of time.  Empty your bladder before and after sexual intercourse.  After a bowel movement, women should cleanse from front to back. Use each tissue only once. SEEK MEDICAL CARE IF:   You have back pain.  You develop a fever.  Your symptoms do not begin to resolve within 3 days. SEEK IMMEDIATE MEDICAL CARE IF:   You have severe back pain or lower abdominal pain.  You develop chills.  You have nausea or vomiting.  You have continued burning or discomfort with  urination. MAKE SURE YOU:   Understand these instructions.  Will watch your condition.  Will get help right away if you are not doing well or get worse.   This information is not intended to replace advice given to you by your health care provider. Make sure you discuss any questions you have with your health care provider.   Document Released: 08/03/2005 Document Revised: 07/15/2015 Document Reviewed: 12/02/2011 Elsevier Interactive Patient Education [employer] Elsevier Inc[employer]

## 2015-08-29 ENCOUNTER — Telehealth: Payer: Self-pay

## 2015-08-29 LAB — URINE CULTURE: Colony Count: >=100000

## 2015-08-29 NOTE — Telephone Encounter (Signed)
Dr. Teofilo PodGreene  Grandmother passed with kidney cancer.  Are you testing for this?    [phone]

## 2015-09-01 NOTE — Telephone Encounter (Signed)
Not specifically, and no blood seen on her urinalysis. If symptoms persists after treatment of UTI, can return to look into other causes. I do not know of specific screening test for kidney cancer, but if symptoms or history warrants - could consider ultrasound. But not usually a cancer that is screened for routinely.

## 2015-09-02 NOTE — Telephone Encounter (Signed)
Spoke with pt, advised message from Dr. Greene. Pt understood. 

## 2015-09-04 ENCOUNTER — Encounter: Payer: Self-pay | Admitting: Family Medicine

## 2015-09-25 ENCOUNTER — Ambulatory Visit: Payer: Self-pay | Admitting: Family Medicine

## 2015-10-02 ENCOUNTER — Ambulatory Visit: Payer: Self-pay | Admitting: Family Medicine

## 2015-10-09 ENCOUNTER — Encounter: Payer: Self-pay | Admitting: Family Medicine

## 2015-11-06 NOTE — Progress Notes (Signed)
This encounter was created in error - please disregard.

## 2015-11-13 ENCOUNTER — Emergency Department (HOSPITAL_COMMUNITY): Payer: Self-pay

## 2015-11-13 ENCOUNTER — Encounter (HOSPITAL_COMMUNITY): Payer: Self-pay | Admitting: *Deleted

## 2015-11-13 ENCOUNTER — Emergency Department (HOSPITAL_COMMUNITY)
Admission: EM | Admit: 2015-11-13 | Discharge: 2015-11-13 | Disposition: A | Discharge status: Home / Self Care | Payer: Self-pay | Attending: Emergency Medicine | Admitting: Emergency Medicine

## 2015-11-13 DIAGNOSIS — R002 Palpitations: Secondary | ICD-10-CM | POA: Insufficient documentation

## 2015-11-13 DIAGNOSIS — Z880 Allergy status to penicillin: Secondary | ICD-10-CM | POA: Insufficient documentation

## 2015-11-13 DIAGNOSIS — Z79899 Other long term (current) drug therapy: Secondary | ICD-10-CM | POA: Insufficient documentation

## 2015-11-13 DIAGNOSIS — R0981 Nasal congestion: Secondary | ICD-10-CM | POA: Insufficient documentation

## 2015-11-13 DIAGNOSIS — F319 Bipolar disorder, unspecified: Secondary | ICD-10-CM | POA: Insufficient documentation

## 2015-11-13 DIAGNOSIS — Z7984 Long term (current) use of oral hypoglycemic drugs: Secondary | ICD-10-CM | POA: Insufficient documentation

## 2015-11-13 DIAGNOSIS — R42 Dizziness and giddiness: Secondary | ICD-10-CM | POA: Insufficient documentation

## 2015-11-13 DIAGNOSIS — I10 Essential (primary) hypertension: Secondary | ICD-10-CM | POA: Insufficient documentation

## 2015-11-13 DIAGNOSIS — E119 Type 2 diabetes mellitus without complications: Secondary | ICD-10-CM | POA: Insufficient documentation

## 2015-11-13 DIAGNOSIS — R509 Fever, unspecified: Secondary | ICD-10-CM | POA: Insufficient documentation

## 2015-11-13 LAB — CBC
HCT: 39.1 % (ref 36.0–46.0)
Hemoglobin: 13.3 g/dL (ref 12.0–15.0)
MCH: 29.7 pg (ref 26.0–34.0)
MCHC: 34 g/dL (ref 30.0–36.0)
MCV: 87.3 fL (ref 78.0–100.0)
Platelets: 296 10*3/uL (ref 150–400)
RBC: 4.48 MIL/uL (ref 3.87–5.11)
RDW: 13.3 % (ref 11.5–15.5)
WBC: 7.8 10*3/uL (ref 4.0–10.5)

## 2015-11-13 LAB — BASIC METABOLIC PANEL
Anion gap: 10 (ref 5–15)
BUN: 9 mg/dL (ref 6–20)
CALCIUM: 9.9 mg/dL (ref 8.9–10.3)
CHLORIDE: 103 mmol/L (ref 101–111)
CO2: 28 mmol/L (ref 22–32)
Creatinine, Ser: 0.53 mg/dL (ref 0.44–1.00)
GFR calc Af Amer: >60 mL/min (ref >60)
GFR calc non Af Amer: >60 mL/min (ref >60)
Glucose, Bld: 109 mg/dL — ABNORMAL HIGH (ref 65–99)
Potassium: 4.2 mmol/L (ref 3.5–5.1)
Sodium: 141 mmol/L (ref 135–145)

## 2015-11-13 LAB — I-STAT TROPONIN, ED: Troponin i, poc: 0 ng/mL (ref 0.00–0.08)

## 2015-11-13 NOTE — ED Provider Notes (Signed)
CSN: [id number]     Arrival date & time 11/13/15  1720 History   First MD Initiated Contact with Patient 11/13/15 2046     Chief Complaint  Patient presents with  . Dizziness  . Palpitations     (Consider location/radiation/quality/duration/timing/severity/associated sxs/prior Treatment) Patient is a 45 y.o. female presenting with dizziness and palpitations. The history is provided by the patient.  Dizziness Associated symptoms: palpitations   Associated symptoms: no chest pain, no diarrhea, no headaches, no nausea, no shortness of breath, no vomiting and no weakness   Palpitations Associated symptoms: dizziness   Associated symptoms: no back pain, no chest pain, no nausea, no numbness, no shortness of breath, no vomiting and no weakness    patient reports she did have much sleep last night woke up this morning feeling dizzy and off balance. At noon time started with heart palpitations. Had a nervous sensation. She took her beta blocker early. The heart palpitations lasted for 2 hours and now have resolved. Patient did not have any true vertigo symptoms. She's felt as if there was some congestion as well. Patient overall feels better now. Patient is not sure whether there may have been an anxiety component to the symptoms.  Past Medical History  Diagnosis Date  . Diabetes mellitus without complication (HCC)   . Bipolar depression (HCC)   . Anxiety   . Hypertension   . Allergy    History reviewed. No pertinent past surgical history. Family History  Problem Relation Age of Onset  . Arthritis Mother   . Hypertension Mother   . Heart disease Mother   . Arthritis Father     rheumatoid  . Hypertension Father   . Hypertension Sister   . Thyroid disease Sister   . Congestive Heart Failure Maternal Grandfather   . Cancer Paternal Grandmother   . Heart disease Paternal Grandfather    Social History  Substance Use Topics  . Smoking status: Never Smoker   . Smokeless tobacco: Never  Used  . Alcohol Use: No   OB History    No data available     Review of Systems  Constitutional: Positive for fever.  HENT: Positive for congestion.   Eyes: Negative for visual disturbance.  Respiratory: Negative for shortness of breath.   Cardiovascular: Positive for palpitations. Negative for chest pain.  Gastrointestinal: Negative for nausea, vomiting, abdominal pain and diarrhea.  Genitourinary: Negative for dysuria.  Musculoskeletal: Negative for back pain and neck pain.  Skin: Negative for rash.  Neurological: Positive for dizziness and light-headedness. Negative for facial asymmetry, speech difficulty, weakness, numbness and headaches.  Hematological: Does not bruise/bleed easily.  Psychiatric/Behavioral: Negative for confusion.      Allergies  Tomato and Penicillins  Home Medications   Prior to Admission medications   Medication Sig Start Date End Date Taking? Authorizing Provider  acetaminophen (TYLENOL) 500 MG tablet Take 1,000 mg by mouth every 6 (six) hours as needed for mild pain.   Yes Historical Provider, MD  Ginkgo Biloba (GINKOBA PO) Take 1 tablet by mouth 2 (two) times daily.   Yes Historical Provider, MD  Guaifenesin (MUCINEX MAXIMUM STRENGTH) 1200 MG TB12 Take 1 tablet (1,200 mg total) by mouth every 12 (twelve) hours as needed. Patient taking differently: Take 1 tablet by mouth every 12 (twelve) hours as needed. For congestion 05/05/14  Yes Chelle Jeffery, PA-C  loratadine (CLARITIN) 10 MG tablet Take 1 tablet (10 mg total) by mouth daily. 04/03/14  Yes Tiffany Neva Seat, PA-C  metFORMIN (GLUCOPHAGE)  850 MG tablet Take 1 tablet once a day with largest meal. Patient taking differently: Take 850 mg by mouth daily. Take 1 tablet once a day with largest meal. 07/25/14  Yes Maurice MarchBarbara B McPherson, MD  metoprolol succinate (TOPROL-XL) 50 MG 24 hr tablet Take 1 tablet (50 mg total) by mouth every evening. 07/25/14  Yes Maurice MarchBarbara B McPherson, MD  Multiple Vitamin (MULTIVITAMIN  WITH MINERALS) TABS tablet Take 1 tablet by mouth daily.   Yes Historical Provider, MD  paliperidone (INVEGA) 6 MG 24 hr tablet Take 12 mg by mouth at bedtime.   Yes Historical Provider, MD  sertraline (ZOLOFT) 100 MG tablet Take 150 mg by mouth daily.    Yes Historical Provider, MD  vitamin E 400 UNIT capsule Take 400 Units by mouth daily.   Yes Historical Provider, MD  HYDROcodone-acetaminophen (NORCO/VICODIN) 5-325 MG per tablet Take 1-2 tablets by mouth every 6 (six) hours as needed. Patient not taking: Reported on 08/28/2015 03/16/15   Francee PiccoloJennifer Piepenbrink, PA-C  nitrofurantoin, macrocrystal-monohydrate, (MACROBID) 100 MG capsule Take 1 capsule (100 mg total) by mouth 2 (two) times daily. Patient not taking: Reported on 11/13/2015 08/28/15   Shade FloodJeffrey R Greene, MD   BP 129/66 mmHg  Pulse 78  Temp(Src) 98.9 F (37.2 C) (Oral)  Resp 15  SpO2 98%  LMP 11/01/2015 Physical Exam  Constitutional: She appears well-developed and well-nourished. No distress.  HENT:  Head: Normocephalic and atraumatic.  Mouth/Throat: Oropharynx is clear and moist.  Eyes: Conjunctivae and EOM are normal. Pupils are equal, round, and reactive to light.  Neck: Normal range of motion. Neck supple.  Cardiovascular: Normal rate, regular rhythm and normal heart sounds.   Pulmonary/Chest: Effort normal and breath sounds normal. No respiratory distress.  Abdominal: Soft. Bowel sounds are normal. There is no tenderness.  Musculoskeletal: Normal range of motion. She exhibits no edema.  Neurological: She is alert. No cranial nerve deficit. She exhibits normal muscle tone. Coordination normal.  Skin: Skin is warm. No rash noted.  Nursing note and vitals reviewed.   ED Course  Procedures (including critical care time) Labs Review Labs Reviewed  BASIC METABOLIC PANEL - Abnormal; Notable for the following:    Glucose, Bld 109 (*)    All other components within normal limits  CBC  I-STAT TROPOININ, ED   Results for  orders placed or performed during the hospital encounter of 11/13/15  Basic metabolic panel  Result Value Ref Range   Sodium 141 135 - 145 mmol/L   Potassium 4.2 3.5 - 5.1 mmol/L   Chloride 103 101 - 111 mmol/L   CO2 28 22 - 32 mmol/L   Glucose, Bld 109 (H) 65 - 99 mg/dL   BUN 9 6 - 20 mg/dL   Creatinine, Ser 6.210.53 0.44 - 1.00 mg/dL   Calcium 9.9 8.9 - 30.810.3 mg/dL   GFR calc non Af Amer >60 >60 mL/min   GFR calc Af Amer >60 >60 mL/min   Anion gap 10 5 - 15  CBC  Result Value Ref Range   WBC 7.8 4.0 - 10.5 K/uL   RBC 4.48 3.87 - 5.11 MIL/uL   Hemoglobin 13.3 12.0 - 15.0 g/dL   HCT 65.739.1 84.636.0 - 96.246.0 %   MCV 87.3 78.0 - 100.0 fL   MCH 29.7 26.0 - 34.0 pg   MCHC 34.0 30.0 - 36.0 g/dL   RDW 95.213.3 84.111.5 - 32.415.5 %   Platelets 296 150 - 400 K/uL  I-stat troponin, ED ([facility])  Result Value Ref Range   Troponin i, poc 0.00 0.00 - 0.08 ng/mL   Comment 3             Imaging Review Dg Chest 2 View  11/13/2015  CLINICAL DATA:  Per patient she has been having a tingling feeling in the back of her neck, weak, she feels like her heart is beating faster than normal X 1 day. HX tachycardia, panic attacks, HTN EXAM: CHEST  2 VIEW COMPARISON:  03/16/2015 FINDINGS: Cardiac silhouette top-normal size. Normal mediastinal and hilar contours. Clear lungs.  No pleural effusion or pneumothorax. Skeletal structures are unremarkable. IMPRESSION: No active cardiopulmonary disease. Electronically Signed   By: Amie Portland M.D.   On: 11/13/2015 18:49   I have personally reviewed and evaluated these images and lab results as part of my medical decision-making.   EKG Interpretation   Date/Time:  Friday November 13 2015 17:29:29 EST Ventricular Rate:  77 PR Interval:  150 QRS Duration: 84 QT Interval:  380 QTC Calculation: 430 R Axis:   93 Text Interpretation:  Normal sinus rhythm Rightward axis Septal infarct ,  age undetermined Abnormal ECG Confirmed by Gideon Burstein  MD, Raad Clayson [phone]) on  11/13/2015  9:17:54 PM      MDM   Final diagnoses:  Heart palpitations    The patient feeling of heart palpitations for 2 hours that started earlier today and now resolved. Patient did take her beta blocker early. Patient's not sure whether there was a bit of a panic attack or anxiety played a role. Patient's had a little bit of congestion felt equilibrium was off this morning but that is now resolved no room spinning so therefore no evidence of vertigo..Like she's had a fever but no fever here. No other real symptoms. Workup here normal EKG normal cardiac monitoring chest x-ray negative for pneumonia pneumothorax or pulmonary edema no leukocytosis no anemia electrolytes are normal. Patient on monitor has been stable here she stable for discharge home and follow-up with her doctor she will return for recurrent symptoms.    Vanetta Mulders, MD 11/13/15 2218

## 2015-11-13 NOTE — ED Notes (Signed)
Pt reports little sleep last night, woke up this am dizziness and "feeling off balance." also having palpitations since noon. Has "nervous sensation" to her neck. ekg done at triage.

## 2015-11-13 NOTE — Discharge Instructions (Signed)
Return for recurrent fast heart rate palpitations or any new or worse symptoms. Today's workup without any acute findings.

## 2016-01-22 ENCOUNTER — Ambulatory Visit (INDEPENDENT_AMBULATORY_CARE_PROVIDER_SITE_OTHER): Payer: Self-pay | Admitting: Physician Assistant

## 2016-01-22 VITALS — BP 144/88 | HR 97 | Temp 98.9°F | Resp 18 | Ht 62.0 in | Wt 174.0 lb

## 2016-01-22 DIAGNOSIS — J0140 Acute pansinusitis, unspecified: Secondary | ICD-10-CM

## 2016-01-22 DIAGNOSIS — Z91048 Other nonmedicinal substance allergy status: Secondary | ICD-10-CM

## 2016-01-22 DIAGNOSIS — Z9109 Other allergy status, other than to drugs and biological substances: Secondary | ICD-10-CM

## 2016-01-22 MED ORDER — FLUTICASONE PROPIONATE 50 MCG/ACT NA SUSP: 2.0000 | Freq: Every day | NASAL | Status: DC

## 2016-01-22 NOTE — Progress Notes (Signed)
[facility] [address], [place] [address] [phone]  Date:  01/22/2016   Name:  Elizabeth [name]   DOB:  03-10-1971   MRN:  [id number]  PCP:  Elizabeth Simmer, MD    Chief Complaint  Patient presents with  . Sore Throat    x 2-3 days  . Ear Pain    History of Present Illness:  Elizabeth [name] is a 45 y.o. female patient who presents to [facility] for cc of throat pain, otalgia, and fatigue.  Pain of both ears, fatigue and sore throat.  No ear drainage.  She has no difficulty in her eatr.  She has allergies.  She is taking claritin and mucinex.  She is sneezing and having watery eyes.  She has no fever.  She feels like she needs to cough, but she does not.    Patient Active Problem List   Diagnosis Date Noted  . Environmental allergies 07/25/2014  . Headache, common migraine (occasionally w/ aura) 04/25/2013  . Diabetes (HCC) 09/26/2012  . HTN (hypertension) 09/26/2012  . Overweight (BMI 25.0-29.9) 09/26/2012  . Metabolic syndrome 09/26/2012  . Bipolar depression (HCC)     Past Medical History  Diagnosis Date  . Diabetes mellitus without complication (HCC)   . Bipolar depression (HCC)   . Anxiety   . Hypertension   . Allergy     History reviewed. No pertinent past surgical history.  Social History  Substance Use Topics  . Smoking status: Never Smoker   . Smokeless tobacco: Never Used  . Alcohol Use: No    Family History  Problem Relation Age of Onset  . Arthritis Mother   . Hypertension Mother   . Heart disease Mother   . Arthritis Father     rheumatoid  . Hypertension Father   . Hypertension Sister   . Thyroid disease Sister   . Congestive Heart Failure Maternal Grandfather   . Cancer Paternal Grandmother   . Heart disease Paternal Grandfather     Allergies  Allergen Reactions  . Tomato     Throat break out   . Penicillins Other (See Comments)    Childhood reaction Has patient had a PCN reaction causing immediate rash,  facial/tongue/throat swelling, SOB or lightheadedness with hypotension: YES Has patient had a PCN reaction causing severe rash involving mucus membranes or skin necrosis: NO Has patient had a PCN reaction that required hospitalizationNO Has patient had a PCN reaction occurring within the last 10 years: NO If all of the above answers are "NO", then may proceed with Cephalosporin use.    Medication list has been reviewed and updated.  Current Outpatient Prescriptions on File Prior to Visit  Medication Sig Dispense Refill  . acetaminophen (TYLENOL) 500 MG tablet Take 1,000 mg by mouth every 6 (six) hours as needed for mild pain.    . Ginkgo Biloba (GINKOBA PO) Take 1 tablet by mouth 2 (two) times daily.    . Guaifenesin (MUCINEX MAXIMUM STRENGTH) 1200 MG TB12 Take 1 tablet (1,200 mg total) by mouth every 12 (twelve) hours as needed. (Patient taking differently: Take 1 tablet by mouth every 12 (twelve) hours as needed. For congestion) 14 tablet 1  . loratadine (CLARITIN) 10 MG tablet Take 1 tablet (10 mg total) by mouth daily. 14 tablet 0  . metFORMIN (GLUCOPHAGE) 850 MG tablet Take 1 tablet once a day with largest meal. (Patient taking differently: Take 850 mg by mouth daily. Take 1 tablet once a day  with largest meal.) 30 tablet 5  . metoprolol succinate (TOPROL-XL) 50 MG 24 hr tablet Take 1 tablet (50 mg total) by mouth every evening. 30 tablet 5  . Multiple Vitamin (MULTIVITAMIN WITH MINERALS) TABS tablet Take 1 tablet by mouth daily.    . paliperidone (INVEGA) 6 MG 24 hr tablet Take 12 mg by mouth at bedtime.    . sertraline (ZOLOFT) 100 MG tablet Take 150 mg by mouth daily.     . vitamin E 400 UNIT capsule Take 400 Units by mouth daily.     No current facility-administered medications on file prior to visit.    ROS ROS otherwise unremarkable unless listed above.   Physical Examination: BP 144/88 mmHg  Pulse 97  Temp(Src) 98.9 F (37.2 C)  Resp 18  Ht 5\' 2"  (1.575 m)  Wt 174 lb  (78.926 kg)  BMI 31.82 kg/m2  SpO2 96%  LMP 01/22/2016 Ideal Body Weight: Weight in (lb) to have BMI = 25: 136.4  Physical Exam  Constitutional: She is oriented to person, place, and time. She appears well-developed and well-nourished. No distress.  HENT:  Head: Normocephalic and atraumatic.  Right Ear: Tympanic membrane, external ear and ear canal normal.  Left Ear: Tympanic membrane, external ear and ear canal normal.  Nose: Mucosal edema and rhinorrhea present. Right sinus exhibits no maxillary sinus tenderness and no frontal sinus tenderness. Left sinus exhibits no maxillary sinus tenderness and no frontal sinus tenderness.  Mouth/Throat: No uvula swelling. No oropharyngeal exudate, posterior oropharyngeal edema or posterior oropharyngeal erythema.  Eyes: Conjunctivae and EOM are normal. Pupils are equal, round, and reactive to light.  Cardiovascular: Normal rate and regular rhythm.  Exam reveals no gallop, no distant heart sounds and no friction rub.   No murmur heard. Pulmonary/Chest: Effort normal. No respiratory distress. She has no decreased breath sounds. She has no wheezes. She has no rhonchi.  Lymphadenopathy:       Head (right side): No submandibular, no tonsillar, no preauricular and no posterior auricular adenopathy present.       Head (left side): No submandibular, no tonsillar, no preauricular and no posterior auricular adenopathy present.  Neurological: She is alert and oriented to person, place, and time.  Skin: She is not diaphoretic.  Psychiatric: She has a normal mood and affect. Her behavior is normal.     Assessment and Plan: Elizabeth [name] is a 45 y.o. female who is here today for congestion. This does not appear to be sinus infection, but allergies inflammation.  Advised to continue claritin, and the mucinex.  Will start flonase, until nasal congestion/post-nasal drip resolve. Continue the claritin.   Subacute pansinusitis - Plan: fluticasone (FLONASE) 50  MCG/ACT nasal spray  Environmental allergies - Plan: fluticasone (FLONASE) 50 MCG/ACT nasal spray  Elizabeth PlattStephanie English, PA-C [facility] 3/17/20175:37 PM

## 2016-01-22 NOTE — Patient Instructions (Signed)
Call me if you have no improvement in the next 5 days.   Please continue to hydrate well. Continue taking the claritin 10mg  daily and mucinex 1200mg  every 12 hours.

## 2016-05-12 ENCOUNTER — Ambulatory Visit: Payer: Self-pay | Attending: Internal Medicine

## 2016-07-28 ENCOUNTER — Ambulatory Visit (INDEPENDENT_AMBULATORY_CARE_PROVIDER_SITE_OTHER): Payer: No Typology Code available for payment source | Admitting: Physician Assistant

## 2016-07-28 VITALS — BP 140/82 | HR 85 | Temp 98.1°F | Resp 18 | Ht 62.0 in | Wt 174.0 lb

## 2016-07-28 DIAGNOSIS — I10 Essential (primary) hypertension: Secondary | ICD-10-CM

## 2016-07-28 DIAGNOSIS — E119 Type 2 diabetes mellitus without complications: Secondary | ICD-10-CM

## 2016-07-28 DIAGNOSIS — Z23 Encounter for immunization: Secondary | ICD-10-CM

## 2016-07-28 DIAGNOSIS — Z130 Encounter for screening for diseases of the blood and blood-forming organs and certain disorders involving the immune mechanism: Secondary | ICD-10-CM

## 2016-07-28 LAB — CBC WITH DIFFERENTIAL/PLATELET
BASOS ABS: 0 {cells}/uL (ref 0–200)
Basophils Relative: 0 %
EOS PCT: 3 %
Eosinophils Absolute: 225 cells/uL (ref 15–500)
HCT: 37.8 % (ref 35.0–45.0)
Hemoglobin: 13.1 g/dL (ref 11.7–15.5)
LYMPHS ABS: 2250 {cells}/uL (ref 850–3900)
LYMPHS PCT: 30 %
MCH: 29.8 pg (ref 27.0–33.0)
MCHC: 34.7 g/dL (ref 32.0–36.0)
MCV: 85.9 fL (ref 80.0–100.0)
MONOS PCT: 9 %
MPV: 9.2 fL (ref 7.5–12.5)
Monocytes Absolute: 675 cells/uL (ref 200–950)
NEUTROS PCT: 58 %
Neutro Abs: 4350 cells/uL (ref 1500–7800)
Platelets: 252 10*3/uL (ref 140–400)
RBC: 4.4 MIL/uL (ref 3.80–5.10)
RDW: 13.4 % (ref 11.0–15.0)
WBC: 7.5 10*3/uL (ref 3.8–10.8)

## 2016-07-28 LAB — POC MICROSCOPIC URINALYSIS (UMFC): Mucus: ABSENT

## 2016-07-28 LAB — COMPLETE METABOLIC PANEL WITHOUT GFR
ALT: 44 U/L — ABNORMAL HIGH (ref 6–29)
AST: 25 U/L (ref 10–35)
Albumin: 4.4 g/dL (ref 3.6–5.1)
Alkaline Phosphatase: 55 U/L (ref 33–115)
BUN: 6 mg/dL — ABNORMAL LOW (ref 7–25)
CO2: 23 mmol/L (ref 20–31)
Calcium: 9 mg/dL (ref 8.6–10.2)
Chloride: 102 mmol/L (ref 98–110)
Creat: 0.51 mg/dL (ref 0.50–1.10)
GFR, Est African American: >89 mL/min
GFR, Est Non African American: >89 mL/min
Glucose, Bld: 139 mg/dL — ABNORMAL HIGH (ref 65–99)
Potassium: 4 mmol/L (ref 3.5–5.3)
Sodium: 137 mmol/L (ref 135–146)
Total Bilirubin: 0.4 mg/dL (ref 0.2–1.2)
Total Protein: 6.7 g/dL (ref 6.1–8.1)

## 2016-07-28 LAB — POCT URINALYSIS DIP (MANUAL ENTRY)
Bilirubin, UA: NEGATIVE
Blood, UA: NEGATIVE
Glucose, UA: NEGATIVE
Ketones, POC UA: NEGATIVE
Leukocytes, UA: NEGATIVE
Nitrite, UA: NEGATIVE
Protein Ur, POC: NEGATIVE
Spec Grav, UA: 1.01
Urobilinogen, UA: 0.2
pH, UA: 5.5

## 2016-07-28 LAB — POCT GLYCOSYLATED HEMOGLOBIN (HGB A1C): HEMOGLOBIN A1C: 6.3

## 2016-07-28 LAB — LIPID PANEL
CHOLESTEROL: 174 mg/dL (ref 125–200)
HDL: 52 mg/dL (ref >=46)
LDL CALC: 92 mg/dL (ref <130)
Total CHOL/HDL Ratio: 3.3 Ratio (ref <=5.0)
Triglycerides: 148 mg/dL (ref <150)
VLDL: 30 mg/dL (ref <30)

## 2016-07-28 MED ORDER — LISINOPRIL 2.5 MG PO TABS: 2.5000 mg | ORAL_TABLET | Freq: Every day | ORAL | 5 refills | Status: DC

## 2016-07-28 MED ORDER — METOPROLOL SUCCINATE ER 50 MG PO TB24: 50.0000 mg | ORAL_TABLET | Freq: Every evening | ORAL | 5 refills | Status: DC

## 2016-07-28 MED ORDER — METFORMIN HCL 850 MG PO TABS
ORAL_TABLET | ORAL | 5 refills | Status: DC
Start: 2016-07-28 — End: 2017-01-18

## 2016-07-28 NOTE — Patient Instructions (Addendum)
Go get your flu shot. Follow up in 3 months for annual physical. Start checking sugars daily, we want you to run around 100-120. Start checking blood pressure at home and then at [facility] to see if it is similar. Bring bp cuff into next visit for calibration.  Start walking daily.  I will call you with your lab results.   Heart-Healthy Eating Plan Heart-healthy meal planning includes:  Limiting unhealthy fats.  Increasing healthy fats.  Making other small dietary changes. You may need to talk with your doctor or a diet specialist (dietitian) to create an eating plan that is right for you. WHAT TYPES OF FAT SHOULD I CHOOSE?  Choose healthy fats. These include olive oil and canola oil, flaxseeds, walnuts, almonds, and seeds.  Eat more omega-3 fats. These include salmon, mackerel, sardines, tuna, flaxseed oil, and ground flaxseeds. Try to eat fish at least twice each week.  Limit saturated fats.  Saturated fats are often found in animal products, such as meats, butter, and cream.  Plant sources of saturated fats include palm oil, palm kernel oil, and coconut oil.  Avoid foods with partially hydrogenated oils in them. These include stick margarine, some tub margarines, cookies, crackers, and other baked goods. These contain trans fats. WHAT GENERAL GUIDELINES DO I NEED TO FOLLOW?  Check food labels carefully. Identify foods with trans fats or high amounts of saturated fat.  Fill one half of your plate with vegetables and green salads. Eat 4-5 servings of vegetables per day. A serving of vegetables is:  1 cup of raw leafy vegetables.   cup of raw or cooked cut-up vegetables.   cup of vegetable juice.  Fill one fourth of your plate with whole grains. Look for the word "whole" as the first word in the ingredient list.  Fill one fourth of your plate with lean protein foods.  Eat 4-5 servings of fruit per day. A serving of fruit is:  One medium whole fruit.   cup of dried  fruit.   cup of fresh, frozen, or canned fruit.   cup of 100% fruit juice.  Eat more foods that contain soluble fiber. These include apples, broccoli, carrots, beans, peas, and barley. Try to get 20-30 g of fiber per day.  Eat more home-cooked food. Eat less restaurant, buffet, and fast food.  Limit or avoid alcohol.  Limit foods high in starch and sugar.  Avoid fried foods.  Avoid frying your food. Try baking, boiling, grilling, or broiling it instead. You can also reduce fat by:  Removing the skin from poultry.  Removing all visible fats from meats.  Skimming the fat off of stews, soups, and gravies before serving them.  Steaming vegetables in water or broth.  Lose weight if you are overweight.  Eat 4-5 servings of nuts, legumes, and seeds per week:  One serving of dried beans or legumes equals  cup after being cooked.  One serving of nuts equals 1 ounces.  One serving of seeds equals  ounce or one tablespoon.  You may need to keep track of how much salt or sodium you eat. This is especially true if you have high blood pressure. Talk with your doctor or dietitian to get more information. WHAT FOODS CAN I EAT? Grains Breads, including [place], white, pita, wheat, raisin, rye, oatmeal, and [place]. Tortillas that are neither fried nor made with lard or trans fat. Low-fat rolls, including hotdog and hamburger buns and English muffins. Biscuits. Muffins. Waffles. Pancakes. Light popcorn. Whole-grain cereals.  Flatbread. Melba toast. Pretzels. Breadsticks. Rusks. Low-fat snacks. Low-fat crackers, including oyster, saltine, matzo, graham, animal, and rye. Rice and pasta, including brown rice and pastas that are made with whole wheat.  Vegetables All vegetables.  Fruits All fruits, but limit coconut. Meats and Other Protein Sources Lean, well-trimmed beef, veal, pork, and lamb. Chicken and [place] without skin. All fish and shellfish. Wild duck, rabbit, pheasant, and  venison. Egg whites or low-cholesterol egg substitutes. Dried beans, peas, lentils, and tofu. Seeds and most nuts. Dairy Low-fat or nonfat cheeses, including ricotta, string, and mozzarella. Skim or 1% milk that is liquid, powdered, or evaporated. Buttermilk that is made with low-fat milk. Nonfat or low-fat yogurt. Beverages Mineral water. Diet carbonated beverages. Sweets and Desserts Sherbets and fruit ices. Honey, jam, marmalade, jelly, and syrups. Meringues and gelatins. Pure sugar candy, such as hard candy, jelly beans, gumdrops, mints, marshmallows, and small amounts of dark chocolate. MGM M[employer]el food cake. Eat all sweets and desserts in moderation. Fats and Oils Nonhydrogenated (trans-free) margarines. Vegetable oils, including soybean, sesame, sunflower, olive, peanut, safflower, corn, canola, and cottonseed. Salad dressings or mayonnaise made with a vegetable oil. Limit added fats and oils that you use for cooking, baking, salads, and as spreads. Other Cocoa powder. Coffee and tea. All seasonings and condiments. The items listed above may not be a complete list of recommended foods or beverages. Contact your dietitian for more options. WHAT FOODS ARE NOT RECOMMENDED? Grains Breads that are made with saturated or trans fats, oils, or whole milk. Croissants. Butter rolls. Cheese breads. Sweet rolls. Donuts. Buttered popcorn. Chow mein noodles. High-fat crackers, such as cheese or butter crackers. Meats and Other Protein Sources Fatty meats, such as hotdogs, short ribs, sausage, spareribs, bacon, rib eye roast or steak, and mutton. High-fat deli meats, such as salami and bologna. Caviar. Domestic duck and goose. Organ meats, such as kidney, liver, sweetbreads, and heart. Dairy Cream, sour cream, cream cheese, and creamed cottage cheese. Whole-milk cheeses, including blue (bleu), [address] [place], [place], [place], [address], [place], [address], cheddar, [place], and [place]. Whole or 2% milk that is  liquid, evaporated, or condensed. Whole buttermilk. Cream sauce or high-fat cheese sauce. Yogurt that is made from whole milk. Beverages Regular sodas and juice drinks with added sugar. Sweets and Desserts Frosting. Pudding. Cookies. Cakes other than angel food cake. Candy that has milk chocolate or white chocolate, hydrogenated fat, butter, coconut, or unknown ingredients. Buttered syrups. Full-fat ice cream or ice cream drinks. Fats and Oils Gravy that has suet, meat fat, or shortening. Cocoa butter, hydrogenated oils, palm oil, coconut oil, palm kernel oil. These can often be found in baked products, candy, fried foods, nondairy creamers, and whipped toppings. Solid fats and shortenings, including bacon fat, salt pork, lard, and butter. Nondairy cream substitutes, such as coffee creamers and sour cream substitutes. Salad dressings that are made of unknown oils, cheese, or sour cream. The items listed above may not be a complete list of foods and beverages to avoid. Contact your dietitian for more information.   This information is not intended to replace advice given to you by your health care provider. Make sure you discuss any questions you have with your health care provider.   Document Released: 04/24/2012 Document Revised: 11/14/2014 Document Reviewed: 04/17/2014 Elsevier Interactive Patient Education [employer]2016 Elsevier Inc.    IF you received an x-ray today, you will receive an invoice from [facility]. Please contact [facility] at [phone] with questions or concerns regarding your invoice.   IF  you received labwork today, you will receive an invoice from [employer]. Please contact [facility] at [phone] with questions or concerns regarding your invoice.   Our billing staff will not be able to assist you with questions regarding bills from these companies.  You will be contacted with the lab results as soon as they are available. The  fastest way to get your results is to activate your My Chart account. Instructions are located on the last page of this paperwork. If you have not heard from [place] regarding the results in 2 weeks, please contact this office.

## 2016-07-28 NOTE — Progress Notes (Signed)
Elizabeth [name]  MRN: [id number] DOB: 07-28-1971  Subjective:  Elizabeth [name] is a 45 y.o. female seen in office today for a chief complaint of medication refill for metformin HCL 850mg  BID and metoprolol succinate 50mg  daily.   1) Diabetes: Was diagnosed in 2006, started medications in 2010. Was initially started on metformin 500 BID but then changed to 850 daily. She was increased in 2016 to 850mg  BID. Her last check up for diabetes was 08/2015. She just purchased new test strips last week, prior to this she had not check her blood sugars in months. Notes that for the past week she has had readings in 120s. She is checking in the morning and at night before bed. Notes that her sugars are higher in the morning. Has no side effects from the medication. Pt has been more thirsty over the past 4-5 weeks. Notes she is drinking about a gallon a day and still feels thirsty. She is not checking feet daily. Her last eye exam was four years ago. She has a diabetic eye appointment next month.   2) HTN: Was diagnosed in 2006, started medications in 2010. Was initially started on metoprolol and HCTZ. Pt lost weight and then had the HCTZ dropped in 2012. Has not followed any one for blood pressure. Checks her bp sporadically. Typically runs 115/79 at home but when she comes to the doctor it typically is in 140s/80s. Has had her bp machine at home for six years. She has not had it calibrated. Has no side effects from the medication.  In terms of diet, she is trying to cook more for the past two months and avoid eating fast food. She is tying to eat more chikcen and fish. She loves pizza and potato chips. Drinks water daily. Has one diet pepsi a day. Avoids sweet tea. She does not eat sweets, prefers salty things.   She has not been exercising. 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   Her psychiatrist has been refilling her medications for DM and HTN for the past  years.     Review of Systems  Cardiovascular: Negative for chest pain, palpitations and leg swelling.  Gastrointestinal: Negative for abdominal pain, constipation, diarrhea, nausea and vomiting.  Endocrine: Positive for polydipsia (noticied she has had increased thirst for the past 4-5 weeks). Negative for polyphagia and polyuria.  Genitourinary: Negative for difficulty urinating, dysuria and hematuria.  Neurological: Positive for headaches ( gets headaches with periods, not having one today). Negative for dizziness, weakness, light-headedness and numbness.    Patient Active Problem List   Diagnosis Date Noted  . Environmental allergies 07/25/2014  . Headache, common migraine (occasionally w/ aura) 04/25/2013  . Diabetes (HCC) 09/26/2012  . HTN (hypertension) 09/26/2012  . Overweight (BMI 25.0-29.9) 09/26/2012  . Metabolic syndrome 09/26/2012  . Bipolar depression (HCC)     Current Outpatient Prescriptions on File Prior to Visit  Medication Sig Dispense Refill  . acetaminophen (TYLENOL) 500 MG tablet Take 1,000 mg by mouth every 6 (six) hours as needed for mild pain.    . fluticasone (FLONASE) 50 MCG/ACT nasal spray Place 2 sprays into both nostrils daily. 16 g 12  . Ginkgo Biloba (GINKOBA PO) Take 1 tablet by mouth 2 (two) times daily.    . Guaifenesin (MUCINEX MAXIMUM STRENGTH) 1200 MG TB12 Take 1 tablet (1,200 mg total) by mouth every 12 (twelve) hours as needed. (Patient taking differently: Take 1 tablet  by mouth every 12 (twelve) hours as needed. For congestion) 14 tablet 1  . loratadine (CLARITIN) 10 MG tablet Take 1 tablet (10 mg total) by mouth daily. 14 tablet 0  . metFORMIN (GLUCOPHAGE) 850 MG tablet Take 1 tablet once a day with largest meal. (Patient taking differently: Take 850 mg by mouth daily. Take 1 tablet once a day with largest meal.) 30 tablet 5  . metoprolol succinate (TOPROL-XL) 50 MG 24 hr tablet Take 1 tablet (50 mg total) by mouth every evening. 30 tablet 5  .  Multiple Vitamin (MULTIVITAMIN WITH MINERALS) TABS tablet Take 1 tablet by mouth daily.    . paliperidone (INVEGA) 6 MG 24 hr tablet Take 12 mg by mouth at bedtime.    . sertraline (ZOLOFT) 100 MG tablet Take 150 mg by mouth daily.     . vitamin E 400 UNIT capsule Take 400 Units by mouth daily.     No current facility-administered medications on file prior to visit.     Allergies  Allergen Reactions  . Tomato     Throat break out   . Penicillins Other (See Comments)    Childhood reaction Has patient had a PCN reaction causing immediate rash, facial/tongue/throat swelling, SOB or lightheadedness with hypotension: YES Has patient had a PCN reaction causing severe rash involving mucus membranes or skin necrosis: NO Has patient had a PCN reaction that required hospitalizationNO Has patient had a PCN reaction occurring within the last 10 years: NO If all of the above answers are "NO", then may proceed with Cephalosporin use.    Objective:  BP 140/82 (BP Location: Right Arm, Patient Position: Sitting, Cuff Size: Small)   Pulse 85   Temp 98.1 F (36.7 C) (Oral)   Resp 18   Ht 5\' 2"  (1.575 m)   Wt 174 lb (78.9 kg)   LMP 07/23/2016   SpO2 96%   BMI 31.83 kg/m   Physical Exam  Constitutional: She is oriented to person, place, and time and well-developed, well-nourished, and in no distress.  HENT:  Head: Normocephalic and atraumatic.  Eyes: Conjunctivae are normal.  Neck: Normal range of motion.  Cardiovascular: Normal rate, regular rhythm, normal heart sounds and intact distal pulses.   Pulmonary/Chest: Effort normal and breath sounds normal.  Musculoskeletal:       Right lower leg: Normal.       Left lower leg: Normal.  Neurological: She is alert and oriented to person, place, and time. Gait normal.  Skin: Skin is warm and dry.  Psychiatric: Affect normal.  Vitals reviewed.   Wt Readings from Last 3 Encounters:  07/28/16 174 lb (78.9 kg)  01/22/16 174 lb (78.9 kg)    08/28/15 173 lb (78.5 kg)   Diabetic Foot Exam - Simple   Simple Foot Form Visual Inspection No deformities, no ulcerations, no other skin breakdown bilaterally:  Yes Sensation Testing Intact to touch and monofilament testing bilaterally:  Yes Pulse Check Posterior Tibialis and Dorsalis pulse intact bilaterally:  Yes Comments     Results for orders placed or performed in visit on 07/28/16 (from the past 24 hour(s))  POCT glycosylated hemoglobin (Hb A1C)     Status: None   Collection Time: 07/28/16  9:59 AM  Result Value Ref Range   Hemoglobin A1C 6.3   POCT urinalysis dipstick     Status: None   Collection Time: 07/28/16 10:07 AM  Result Value Ref Range   Color, UA yellow yellow   Clarity, UA clear clear  Glucose, UA negative negative   Bilirubin, UA negative negative   Ketones, POC UA negative negative   Spec Grav, UA 1.010    Blood, UA negative negative   pH, UA 5.5    Protein Ur, POC negative negative   Urobilinogen, UA 0.2    Nitrite, UA Negative Negative   Leukocytes, UA Negative Negative  POCT Microscopic Urinalysis ([facility])     Status: None   Collection Time: 07/28/16 10:08 AM  Result Value Ref Range   WBC,UR,HPF,POC None None WBC/hpf   RBC,UR,HPF,POC None None RBC/hpf   Bacteria None None, Too numerous to count   Mucus Absent Absent   Epithelial Cells, UR Per Microscopy None None, Too numerous to count cells/hpf    Assessment and Plan :   1. Controlled type 2 diabetes mellitus without complication, without long-term current use of insulin (HCC) - COMPLETE METABOLIC PANEL WITH GFR - POCT glycosylated hemoglobin (Hb A1C) - HM Diabetes Foot Exam - Microalbumin, urine - POCT urinalysis dipstick - POCT Microscopic Urinalysis ([facility]) - Pneumococcal polysaccharide vaccine 23-valent greater than or equal to 2yo subcutaneous/IM - metFORMIN (GLUCOPHAGE) 850 MG tablet; Take 1 tablet twice daily with meals.  Dispense: 60 tablet; Refill: 5 - lisinopril (ZESTRIL) 2.5  MG tablet; Take 1 tablet (2.5 mg total) by mouth daily.  Dispense: 30 tablet; Refill: 5 -Follow up in three months  -Will start a statin once results return  2. Essential hypertension - COMPLETE METABOLIC PANEL WITH GFR - Lipid panel - TSH - metoprolol succinate (TOPROL-XL) 50 MG 24 hr tablet; Take 1 tablet (50 mg total) by mouth every evening.  Dispense: 30 tablet; Refill: 5 -Bring home bp into office next visit for calibration  3. Screening for deficiency anemia - COMPLETE METABOLIC PANEL WITH GFR - CBC with Differential/Platelet   Benjiman Core PA-C  [facility] 07/28/2016 10:18 AM

## 2016-07-29 LAB — TSH: TSH: 2.42 m[IU]/L

## 2016-07-29 LAB — MICROALBUMIN, URINE: Microalb, Ur: 0.4 mg/dL

## 2016-08-23 ENCOUNTER — Ambulatory Visit (INDEPENDENT_AMBULATORY_CARE_PROVIDER_SITE_OTHER): Payer: No Typology Code available for payment source | Admitting: Student

## 2016-08-23 DIAGNOSIS — J069 Acute upper respiratory infection, unspecified: Secondary | ICD-10-CM

## 2016-08-23 DIAGNOSIS — J329 Chronic sinusitis, unspecified: Secondary | ICD-10-CM

## 2016-08-23 DIAGNOSIS — R000 Tachycardia, unspecified: Secondary | ICD-10-CM

## 2016-08-23 DIAGNOSIS — B9789 Other viral agents as the cause of diseases classified elsewhere: Secondary | ICD-10-CM

## 2016-08-23 NOTE — Progress Notes (Signed)
Subjective:    Patient ID: Elizabeth [name], female    DOB: 22-Oct-1971, 45 y.o.   MRN: [id number]  HPI Presents with 3 day history of nasal congestion, right ear pain, scratchy throat.  She has clear nasal discharge.  She has also been having some dizziness that started today.  She has had small amount of juice and then drank a 20 oz soda for lunch.  She reports that a doctor told her not to drink soda because it makes her heart rate fast.  She has not had any other fluids today.  She denies fevers or chills but has felt hot and believes she may be going through "the change".  Recent thyroid check less than a month ago was normal.   She denies any syncope episodes.   Review of Systems  Constitutional: Negative for chills, fatigue, fever and unexpected weight change.  HENT: Positive for congestion, ear pain, postnasal drip, rhinorrhea and sore throat. Negative for drooling, ear discharge, hearing loss and trouble swallowing.   Eyes: Negative for pain and redness.  Respiratory: Negative for cough, chest tightness and shortness of breath.   Cardiovascular: Negative for chest pain and leg swelling.  Gastrointestinal: Negative for abdominal pain and nausea.  Genitourinary: Negative for dysuria and urgency.  Musculoskeletal: Negative for arthralgias and joint swelling.  Skin: Negative for rash and wound.  Neurological: Positive for dizziness and light-headedness. Negative for syncope.  Psychiatric/Behavioral: Negative for agitation and confusion.  All other systems reviewed and are negative.      Objective:   Physical Exam  Constitutional: She is oriented to person, place, and time. She appears well-developed and well-nourished. No distress.  sweaty  HENT:  Head: Normocephalic and atraumatic.  Right Ear: External ear normal.  Left Ear: External ear normal.  Eyes: Conjunctivae are normal. Right eye exhibits no discharge. Left eye exhibits no discharge. No scleral icterus.  Neck: Normal  range of motion. Neck supple.  Cardiovascular: Regular rhythm, normal heart sounds and intact distal pulses.  Exam reveals no gallop and no friction rub.   No murmur heard. Tachycardic, but improved with oral fluids to 96  Pulmonary/Chest: Effort normal and breath sounds normal. No respiratory distress. She has no wheezes. She has no rales. She exhibits no tenderness.  Musculoskeletal: Normal range of motion. She exhibits no edema.  Neurological: She is alert and oriented to person, place, and time.  Skin: Skin is warm. No rash noted. No erythema.  Psychiatric: She has a normal mood and affect. Her behavior is normal. Judgment and thought content normal.  Nursing note and vitals reviewed.  BP 136/86 (BP Location: Right Arm, Patient Position: Sitting, Cuff Size: Large)   Pulse 96   Temp 98.7 F (37.1 C) (Oral)   Resp 18   Ht 5' 1.5" (1.562 m)   Wt 176 lb 6.4 oz (80 kg)   LMP 08/23/2016   SpO2 94%   BMI 32.79 kg/m         Assessment & Plan:  Tachycardia Due to bipolar medication, caffeine intake, and lack of other types of fluid intake.  She will come in tomorrow for a pulse recheck.  After drinking water, her pulse came down to 96 and her dizziness resolved.  Would consider EKG if still having tachycardia tomorrow and an office visit.  She had a regular rhythm.  Viral sinusitis This appear to be viral and not bacterial, so will treat conservatively.  However, she will follow up next week if she has  no improvement, or sooner if she appears to be worsened.  Signed,  Corliss Marcus, DO [facility] 6:15 PM

## 2016-08-23 NOTE — Assessment & Plan Note (Addendum)
Due to bipolar medication, caffeine intake, and lack of other types of fluid intake.  She will come in tomorrow for a pulse recheck.  After drinking water, her pulse came down to 96 and her dizziness resolved.  Would consider EKG if still having tachycardia tomorrow and an office visit.  She had a regular rhythm.

## 2016-08-23 NOTE — Assessment & Plan Note (Addendum)
This appear to be viral and not bacterial, so will treat conservatively.  However, she will follow up next week if she has no improvement, or sooner if she appears to be worsened.

## 2016-08-23 NOTE — Patient Instructions (Addendum)
  Please come in tomorrow for nurse check for recheck of your pulse. Please drink plenty of fluids (not caffeinated). Avoid sodas, coffee Make sure to take beta blocker tonight. Return next week if sinus issues not improved and sooner if they are worsening.    IF you received an x-ray today, you will receive an invoice from [facility]. Please contact [facility] at [phone] with questions or concerns regarding your invoice.   IF you received labwork today, you will receive an invoice from [facility]. Please contact [facility] at [phone] with questions or concerns regarding your invoice.   Our billing staff will not be able to assist you with questions regarding bills from these companies.  You will be contacted with the lab results as soon as they are available. The fastest way to get your results is to activate your My Chart account. Instructions are located on the last page of this paperwork. If you have not heard from us regarding the results in 2 weeks, please contact this office.

## 2016-08-24 ENCOUNTER — Ambulatory Visit (INDEPENDENT_AMBULATORY_CARE_PROVIDER_SITE_OTHER): Payer: No Typology Code available for payment source | Admitting: Family Medicine

## 2016-08-24 VITALS — HR 78

## 2016-08-24 DIAGNOSIS — R000 Tachycardia, unspecified: Secondary | ICD-10-CM

## 2016-08-26 ENCOUNTER — Ambulatory Visit (INDEPENDENT_AMBULATORY_CARE_PROVIDER_SITE_OTHER): Payer: No Typology Code available for payment source | Admitting: Physician Assistant

## 2016-08-26 VITALS — BP 132/80 | HR 90 | Temp 98.2°F | Resp 17 | Ht 61.5 in | Wt 179.0 lb

## 2016-08-26 DIAGNOSIS — R42 Dizziness and giddiness: Secondary | ICD-10-CM

## 2016-08-26 DIAGNOSIS — J069 Acute upper respiratory infection, unspecified: Secondary | ICD-10-CM

## 2016-08-26 MED ORDER — DOXYCYCLINE HYCLATE 100 MG PO CAPS: 100.0000 mg | ORAL_CAPSULE | Freq: Two times a day (BID) | ORAL | 0 refills | Status: DC

## 2016-08-26 NOTE — Progress Notes (Signed)
Patient ID: Elizabeth [name], female    DOB: 1971-11-07, 45 y.o.   MRN: [id number]  PCP: Nilda SimmerSMITH,KRISTI, MD  Chief Complaint  Patient presents with  . Follow-up    Dizziness is better but still there    Subjective:   HPI:  [age] yof presents with above complaint.   Initially seen here 3 days ago with right ear congestion. Thought to have viral URI. Was tachycardic at the time thought secondary to caffeine, bipolar medications, dehydration. She returned the next day (2 days ago) for HR recheck and HR was 70s bpm. She also mentioned some slight dizziness at prior appointment.   Today she returns for ongoing, though improved, symptoms. She continues to have right ear congestion, right facial congestion and pain. This has not improved since visit 3 days and has actually worsened a bit. Her dizziness has improved. Mild past couple days. She has not been taking anything for the URI but has been taking mucinex daily during allergy season. No decongestant or nasal spray. Temp last night at home was 99.6. Feeling more fatigued past couple days. Denies cough.   Denies cp or palpitations with the dizziness. Denies vertigo. Has felt like heart is fast but no abnormal beats.   She was tachycardic 3 days ago during visit. Recheck 2 days ago HR was normal. States she has increased her H20 intake since then. Drinking good amount fluids past 2 days.    Patient Active Problem List   Diagnosis Date Noted  . Viral sinusitis 08/23/2016  . Tachycardia 08/23/2016  . Environmental allergies 07/25/2014  . Headache, common migraine (occasionally w/ aura) 04/25/2013  . Diabetes (HCC) 09/26/2012  . HTN (hypertension) 09/26/2012  . Overweight (BMI 25.0-29.9) 09/26/2012  . Metabolic syndrome 09/26/2012  . Bipolar depression (HCC)     Past Medical History:  Diagnosis Date  . Allergy   . Anxiety   . Bipolar depression (HCC)   . Diabetes mellitus without complication (HCC)   . Hypertension      Prior to  Admission medications   Medication Sig Start Date End Date Taking? Authorizing Provider  acetaminophen (TYLENOL) 500 MG tablet Take 1,000 mg by mouth every 6 (six) hours as needed for mild pain.   Yes Historical Provider, MD  B Complex-C (B-COMPLEX WITH VITAMIN C) tablet Take 1 tablet by mouth daily.   Yes Historical Provider, MD  fluticasone (FLONASE) 50 MCG/ACT nasal spray Place 2 sprays into both nostrils daily. 01/22/16  Yes Stephanie D English, PA  Ginkgo Biloba (GINKOBA PO) Take 1 tablet by mouth 2 (two) times daily.   Yes Historical Provider, MD  Guaifenesin (MUCINEX MAXIMUM STRENGTH) 1200 MG TB12 Take 1 tablet (1,200 mg total) by mouth every 12 (twelve) hours as needed. Patient taking differently: Take 1 tablet by mouth every 12 (twelve) hours as needed. For congestion 05/05/14  Yes Chelle Jeffery, PA-C  lisinopril (ZESTRIL) 2.5 MG tablet Take 1 tablet (2.5 mg total) by mouth daily. 07/28/16  Yes Magdalene RiverBrittany D Wiseman, PA-C  loratadine (CLARITIN) 10 MG tablet Take 1 tablet (10 mg total) by mouth daily. 04/03/14  Yes Tiffany Neva SeatGreene, PA-C  metFORMIN (GLUCOPHAGE) 850 MG tablet Take 1 tablet twice daily with meals. 07/28/16  Yes Magdalene RiverBrittany D Wiseman, PA-C  metoprolol succinate (TOPROL-XL) 50 MG 24 hr tablet Take 1 tablet (50 mg total) by mouth every evening. 07/28/16  Yes Magdalene RiverBrittany D Wiseman, PA-C  Multiple Vitamin (MULTIVITAMIN WITH MINERALS) TABS tablet Take 1 tablet by mouth daily.   Yes Historical  Provider, MD  Omega-3 Fatty Acids (FISH OIL) 1000 MG CPDR Take by mouth.   Yes Historical Provider, MD  paliperidone (INVEGA) 6 MG 24 hr tablet Take 12 mg by mouth at bedtime.   Yes Historical Provider, MD  sertraline (ZOLOFT) 100 MG tablet Take 150 mg by mouth daily.    Yes Historical Provider, MD  vitamin E 400 UNIT capsule Take 400 Units by mouth daily.   Yes Historical Provider, MD    Allergies  Allergen Reactions  . Tomato     Throat break out   . Penicillins Other (See Comments)    Childhood  reaction Has patient had a PCN reaction causing immediate rash, facial/tongue/throat swelling, SOB or lightheadedness with hypotension: YES Has patient had a PCN reaction causing severe rash involving mucus membranes or skin necrosis: NO Has patient had a PCN reaction that required hospitalizationNO Has patient had a PCN reaction occurring within the last 10 years: NO If all of the above answers are "NO", then may proceed with Cephalosporin use.    No past surgical history on file.  Family History  Problem Relation Age of Onset  . Arthritis Mother   . Hypertension Mother   . Heart disease Mother   . Arthritis Father     rheumatoid  . Hypertension Father   . Hypertension Sister   . Thyroid disease Sister   . Congestive Heart Failure Maternal Grandfather   . Cancer Paternal Grandmother   . Heart disease Paternal Grandfather     Social History   Social History  . Marital status: Single    Spouse name: N/A  . Number of children: 0  . Years of education: N/A   Occupational History  . cashier-part time     [employer]  .  [employer]  . student     medical office administration [employer]   Social History Main Topics  . Smoking status: Never Smoker  . Smokeless tobacco: Never Used  . Alcohol use No  . Drug use: No  . Sexual activity: Not Asked     Comment: number of sex partners in the last 12 months  0   Other Topics Concern  . None   Social History Narrative   Lives alone. Sister lives in [place].   Review of Systems  See HPI   Objective:  Physical Exam  Constitutional: She appears well-developed and well-nourished.  Non-toxic appearance. She does not have a sickly appearance. She does not appear ill. No distress.  HENT:  Right Ear: Tympanic membrane normal. No tenderness. No mastoid tenderness. No middle ear effusion.  Left Ear: Tympanic membrane normal.  Nose: Mucosal edema and rhinorrhea present. Right sinus exhibits maxillary sinus tenderness and frontal  sinus tenderness. Left sinus exhibits no maxillary sinus tenderness and no frontal sinus tenderness.  Mouth/Throat: Uvula is midline, oropharynx is clear and moist and mucous membranes are normal.  Pulmonary/Chest: Effort normal and breath sounds normal. No tachypnea.  Lymphadenopathy:       Head (right side): No submental, no submandibular and no tonsillar adenopathy present.       Head (left side): No submental, no submandibular and no tonsillar adenopathy present.    She has no cervical adenopathy.   BP 132/80 (BP Location: Right Arm, Patient Position: Sitting, Cuff Size: Normal)   Pulse 90   Temp 98.2 F (36.8 C) (Oral)   Resp 17   Ht 5' 1.5" (1.562 m)   Wt 179 lb (81.2 kg)   LMP 08/23/2016  SpO2 97%   BMI 33.27 kg/m   12 lead EKG: NSR. No ST/T wave changes suggestive of ischemia.   Assessment & Plan:   Upper respiratory tract infection, unspecified type - Plan: doxycycline (VIBRAMYCIN) 100 MG capsule  Dizziness - Plan: EKG 12-Lead -- URI could be bacterial at this point given ongoing and slight worsening of congestion, low grade fevers -- start decongstant, continue mucinex, start flonase -- if symptoms persist into early next week start antibiotic (doxy given hx pcn allergy) - script for doxy given today so that pt does not have to come back for a 4th time next week if symptoms persist -- dizziness likely secondary to head/ear congestion, improved today, 12 lead normal, continue with adequate hydration   Donnajean Lopes, PA-C Physician Assistant-Certified [facility]  08/26/2016 2:26 PM

## 2016-08-26 NOTE — Patient Instructions (Addendum)
Your URI could possibly be bacterial at this point given ongoing, worsening symptoms and low grade fever.  Please continue to take your mucinex and start taking a decongestant like coricidin HBP.  Please use flonase two sprays in each nostril twice daily during allergy season.  I have sent in a prescription for doxycycline (antibiotic) for you. Please take this if your symptoms continue into early next week or your fevers worsen.  Your EKG was normal today which is reassuring. Your dizziness could be due to the head congestion.  Please come back to see us if your symptoms do not improve.

## 2016-09-12 IMAGING — DX DG CHEST 2V
2 series · 2 of 2 positions shown · non-contrast
Comparison: 10/03/2014

CLINICAL DATA: Right-sided back pain. Worse with inspiration and
cough

EXAM:
CHEST  2 VIEW

[chest pa]
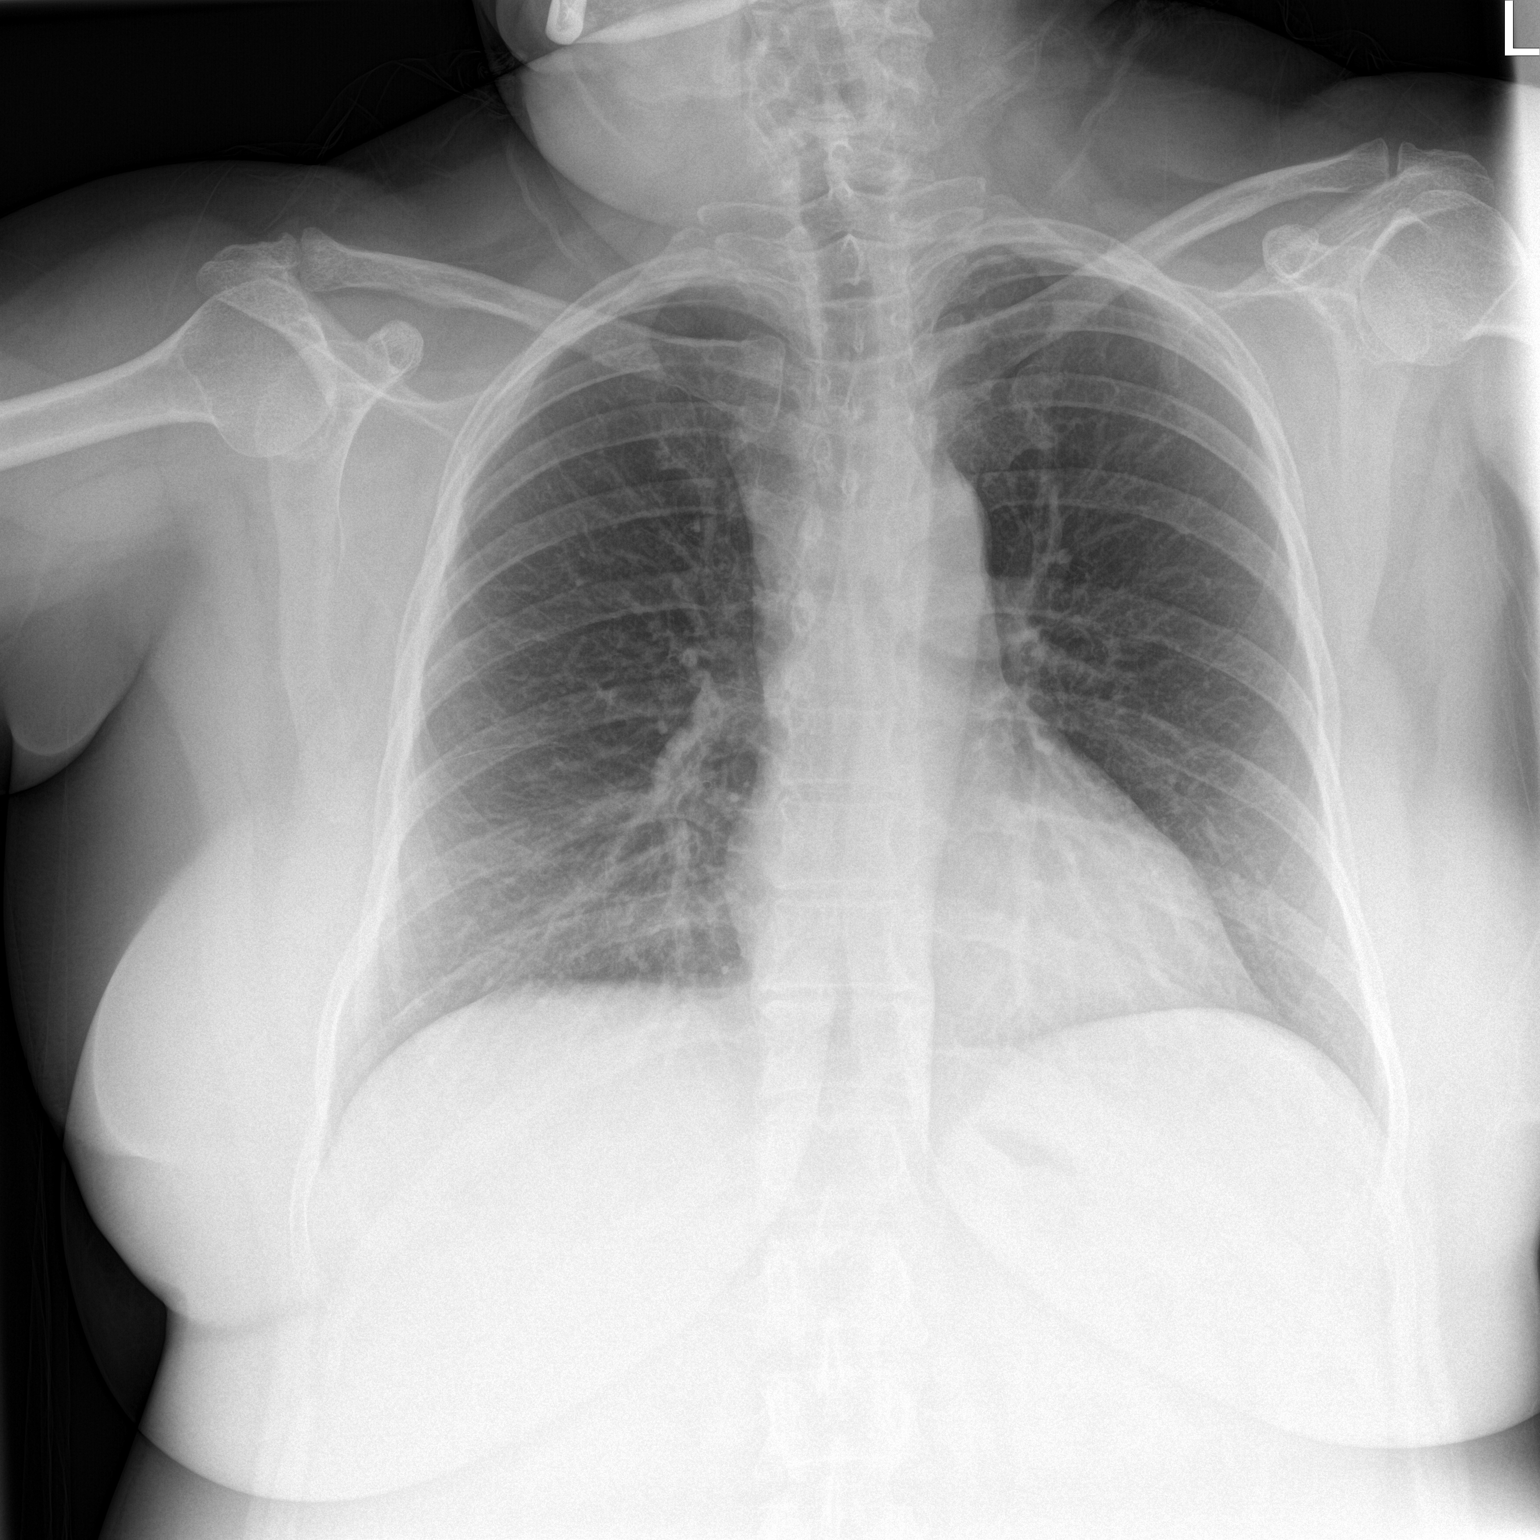

[chest lat]
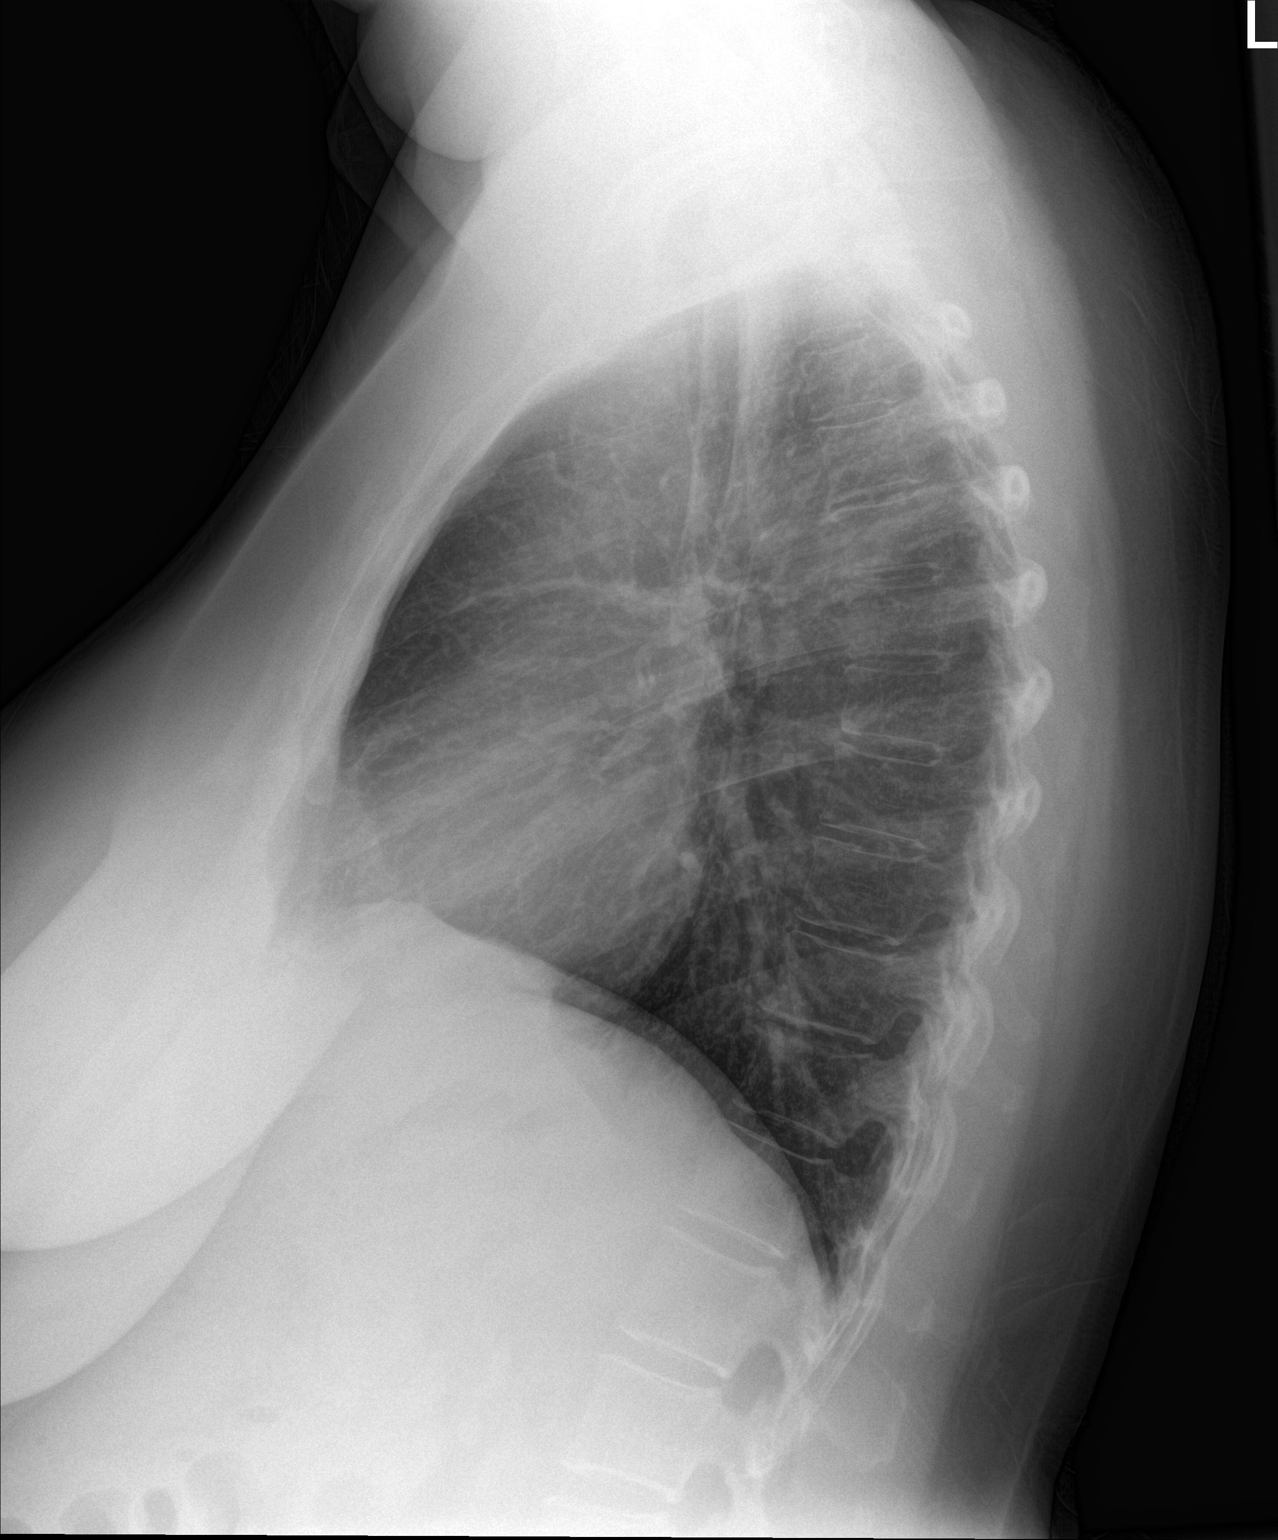

[2 of 2 positions shown; findings below may reference images not displayed]

FINDINGS: The heart size and mediastinal contours are within normal limits.
Both lungs are clear. The visualized skeletal structures are
unremarkable.
IMPRESSION: No active cardiopulmonary disease.

## 2016-10-05 ENCOUNTER — Ambulatory Visit (INDEPENDENT_AMBULATORY_CARE_PROVIDER_SITE_OTHER): Payer: No Typology Code available for payment source | Admitting: Physician Assistant

## 2016-10-05 VITALS — BP 122/76 | HR 96 | Temp 98.5°F | Resp 18 | Ht 61.5 in | Wt 174.0 lb

## 2016-10-05 DIAGNOSIS — R21 Rash and other nonspecific skin eruption: Secondary | ICD-10-CM

## 2016-10-05 DIAGNOSIS — L02419 Cutaneous abscess of limb, unspecified: Secondary | ICD-10-CM

## 2016-10-05 DIAGNOSIS — L03119 Cellulitis of unspecified part of limb: Secondary | ICD-10-CM

## 2016-10-05 MED ORDER — DOXYCYCLINE HYCLATE 100 MG PO CAPS: 100.0000 mg | ORAL_CAPSULE | Freq: Two times a day (BID) | ORAL | 0 refills | Status: DC

## 2016-10-05 NOTE — Patient Instructions (Addendum)
Take doxycycline as prescribed.  -Return to clinic if symptoms worsen, do not improve, or as needed Thank you for letting me participate in your health and well being.   Cellulitis, Adult Introduction Cellulitis is a skin infection. The infected area is usually red and sore. This condition occurs most often in the arms and lower legs. It is very important to get treated for this condition. Follow these instructions at home:  Take over-the-counter and prescription medicines only as told by your doctor.  If you were prescribed an antibiotic medicine, take it as told by your doctor. Do not stop taking the antibiotic even if you start to feel better.  Drink enough fluid to keep your pee (urine) clear or pale yellow.  Do not touch or rub the infected area.  Raise (elevate) the infected area above the level of your heart while you are sitting or lying down.  Place warm or cold wet cloths (warm or cold compresses) on the infected area. Do this as told by your doctor.  Keep all follow-up visits as told by your doctor. This is important. These visits let your doctor make sure your infection is not getting worse. Contact a doctor if:  You have a fever.  Your symptoms do not get better after 1-2 days of treatment.  Your bone or joint under the infected area starts to hurt after the skin has healed.  Your infection comes back. This can happen in the same area or another area.  You have a swollen bump in the infected area.  You have new symptoms.  You feel ill and also have muscle aches and pains. Get help right away if:  Your symptoms get worse.  You feel very sleepy.  You throw up (vomit) or have watery poop (diarrhea) for a long time.  There are red streaks coming from the infected area.  Your red area gets larger.  Your red area turns darker. This information is not intended to replace advice given to you by your health care Elizabeth [name]. Make sure you discuss any questions you  have with your health care Elizabeth [name]. Document Released: 04/11/2008 Document Revised: 03/31/2016 Document Reviewed: 09/02/2015  2017 Elsevier    IF you received an x-ray today, you will receive an invoice from [facility]. Please contact [facility] at [phone] with questions or concerns regarding your invoice.   IF you received labwork today, you will receive an invoice from [facility]. Please contact [facility] at [phone] with questions or concerns regarding your invoice.   Our billing staff will not be able to assist you with questions regarding bills from these companies.  You will be contacted with the lab results as soon as they are available. The fastest way to get your results is to activate your My Chart account. Instructions are located on the last page of this paperwork. If you have not heard from us regarding the results in 2 weeks, please contact this office.

## 2016-10-05 NOTE — Progress Notes (Signed)
Elizabeth [name]  MRN: [id number] DOB: [birth year]  Subjective:  Elizabeth [name] is a 45 y.o. female seen in office today for a chief complaint of bump on left leg x 6 days.   She was at work and felt a pinch on her thigh. Later that night she noticed a bump on her left thigh. Notes since then it has stayed the same in size but has been changing colors (from red to black). Had associated itching when it first happened but that resolved after 30 minutes. No pain,  purulent discharge, or spreading rash.   She is allergic to bees, yellow jackets, and mosquitoes. Notes she swells when this happens.   Review of Systems  Constitutional: Positive for chills. Negative for diaphoresis and fever.  HENT: Negative for trouble swallowing and voice change. Sore throat:  for the past three days.   Gastrointestinal: Negative for abdominal pain, nausea and vomiting.  Musculoskeletal: Negative for arthralgias, joint swelling, myalgias and neck pain.    Patient Active Problem List   Diagnosis Date Noted  . Viral sinusitis 08/23/2016  . Tachycardia 08/23/2016  . Environmental allergies 07/25/2014  . Headache, common migraine (occasionally w/ aura) 04/25/2013  . Diabetes (HCC) 09/26/2012  . HTN (hypertension) 09/26/2012  . Overweight (BMI 25.0-29.9) 09/26/2012  . Metabolic syndrome 09/26/2012  . Bipolar depression (HCC)     Current Outpatient Prescriptions on File Prior to Visit  Medication Sig Dispense Refill  . acetaminophen (TYLENOL) 500 MG tablet Take 1,000 mg by mouth every 6 (six) hours as needed for mild pain.    . B Complex-C (B-COMPLEX WITH VITAMIN C) tablet Take 1 tablet by mouth daily.    . fluticasone (FLONASE) 50 MCG/ACT nasal spray Place 2 sprays into both nostrils daily. 16 g 12  . Ginkgo Biloba (GINKOBA PO) Take 1 tablet by mouth 2 (two) times daily.    . Guaifenesin (MUCINEX MAXIMUM STRENGTH) 1200 MG TB12 Take 1 tablet (1,200 mg total) by mouth every 12 (twelve) hours as needed.  (Patient taking differently: Take 1 tablet by mouth every 12 (twelve) hours as needed. For congestion) 14 tablet 1  . lisinopril (ZESTRIL) 2.5 MG tablet Take 1 tablet (2.5 mg total) by mouth daily. 30 tablet 5  . loratadine (CLARITIN) 10 MG tablet Take 1 tablet (10 mg total) by mouth daily. 14 tablet 0  . metFORMIN (GLUCOPHAGE) 850 MG tablet Take 1 tablet twice daily with meals. 60 tablet 5  . metoprolol succinate (TOPROL-XL) 50 MG 24 hr tablet Take 1 tablet (50 mg total) by mouth every evening. 30 tablet 5  . Multiple Vitamin (MULTIVITAMIN WITH MINERALS) TABS tablet Take 1 tablet by mouth daily.    . Omega-3 Fatty Acids (FISH OIL) 1000 MG CPDR Take by mouth.    . paliperidone (INVEGA) 6 MG 24 hr tablet Take 12 mg by mouth at bedtime.    . sertraline (ZOLOFT) 100 MG tablet Take 150 mg by mouth daily.     . vitamin E 400 UNIT capsule Take 400 Units by mouth daily.     No current facility-administered medications on file prior to visit.     Allergies  Allergen Reactions  . Tomato     Throat break out   . Penicillins Other (See Comments)    Childhood reaction Has patient had a PCN reaction causing immediate rash, facial/tongue/throat swelling, SOB or lightheadedness with hypotension: YES Has patient had a PCN reaction causing severe rash involving mucus membranes or skin necrosis: NO Has  patient had a PCN reaction that required hospitalizationNO Has patient had a PCN reaction occurring within the last 10 years: NO If all of the above answers are "NO", then may proceed with Cephalosporin use.     Objective:  BP 122/76 (BP Location: Right Arm, Patient Position: Sitting, Cuff Size: Small)   Pulse 96   Temp 98.5 F (36.9 C) (Oral)   Resp 18   Ht 5' 1.5" (1.562 m)   Wt 174 lb (78.9 kg)   LMP 09/10/2016   SpO2 96%   BMI 32.34 kg/m   Physical Exam  Constitutional: She is oriented to person, place, and time and well-developed, well-nourished, and in no distress.  HENT:  Head:  Normocephalic and atraumatic.  Eyes: Conjunctivae are normal.  Neck: Normal range of motion.  Pulmonary/Chest: Effort normal.  Neurological: She is alert and oriented to person, place, and time. Gait normal.  Skin: Skin is warm and dry.     Psychiatric: Affect normal.  Vitals reviewed.   Assessment and Plan :  1. Rash and nonspecific skin eruption -Could be from insect bite, will cover for underlying infection - doxycycline (VIBRAMYCIN) 100 MG capsule; Take 1 capsule (100 mg total) by mouth 2 (two) times daily.  Dispense: 20 capsule; Refill: 0 - WOUND CULTURE -Return to clinic if symptoms worsen, do not improve, or as needed   Benjiman CoreBrittany Wilberto Console PA-C  [facility] 10/05/2016 9:20 AM

## 2016-10-08 LAB — WOUND CULTURE
Gram Stain: NONE SEEN
Gram Stain: NONE SEEN
ORGANISM ID, BACTERIA: NORMAL

## 2016-10-19 ENCOUNTER — Encounter: Payer: Self-pay | Admitting: Physician Assistant

## 2016-10-19 ENCOUNTER — Ambulatory Visit (INDEPENDENT_AMBULATORY_CARE_PROVIDER_SITE_OTHER): Payer: No Typology Code available for payment source | Admitting: Physician Assistant

## 2016-10-19 VITALS — BP 134/80 | HR 75 | Temp 99.7°F | Resp 18 | Wt 172.0 lb

## 2016-10-19 DIAGNOSIS — Z01411 Encounter for gynecological examination (general) (routine) with abnormal findings: Secondary | ICD-10-CM

## 2016-10-19 DIAGNOSIS — E119 Type 2 diabetes mellitus without complications: Secondary | ICD-10-CM

## 2016-10-19 DIAGNOSIS — I10 Essential (primary) hypertension: Secondary | ICD-10-CM

## 2016-10-19 DIAGNOSIS — Z0000 Encounter for general adult medical examination without abnormal findings: Secondary | ICD-10-CM

## 2016-10-19 DIAGNOSIS — Z124 Encounter for screening for malignant neoplasm of cervix: Secondary | ICD-10-CM

## 2016-10-19 DIAGNOSIS — Z808 Family history of malignant neoplasm of other organs or systems: Secondary | ICD-10-CM

## 2016-10-19 DIAGNOSIS — R000 Tachycardia, unspecified: Secondary | ICD-10-CM

## 2016-10-19 LAB — POCT URINALYSIS DIP (MANUAL ENTRY)
Bilirubin, UA: NEGATIVE
Blood, UA: NEGATIVE
GLUCOSE UA: NEGATIVE
LEUKOCYTES UA: NEGATIVE
Nitrite, UA: NEGATIVE
PROTEIN UA: NEGATIVE
SPEC GRAV UA: 1.025
UROBILINOGEN UA: 0.2
pH, UA: 5.5

## 2016-10-19 LAB — POCT CBC
GRANULOCYTE PERCENT: 67.2 % (ref 37–80)
HEMATOCRIT: 38 % (ref 37.7–47.9)
Hemoglobin: 13.6 g/dL (ref 12.2–16.2)
LYMPH, POC: 2 (ref 0.6–3.4)
MCH, POC: 30.3 pg (ref 27–31.2)
MCHC: 35.8 g/dL — AB (ref 31.8–35.4)
MCV: 84.6 fL (ref 80–97)
MID (CBC): 0.6 (ref 0–0.9)
MPV: 6.6 fL (ref 0–99.8)
POC GRANULOCYTE: 5.2 (ref 2–6.9)
POC LYMPH %: 25.3 % (ref 10–50)
POC MID %: 7.5 %M (ref 0–12)
Platelet Count, POC: 245 10*3/uL (ref 142–424)
RBC: 4.49 M/uL (ref 4.04–5.48)
RDW, POC: 13.1 %
WBC: 7.8 10*3/uL (ref 4.6–10.2)

## 2016-10-19 LAB — POCT INFLUENZA A/B
INFLUENZA A, POC: NEGATIVE
INFLUENZA B, POC: NEGATIVE

## 2016-10-19 LAB — POCT UA - MICROSCOPIC ONLY: YEAST UA: ABSENT

## 2016-10-19 NOTE — Progress Notes (Signed)
Elizabeth [name]  MRN: [id number] DOB: 1971/10/22  Subjective:  Pt is a 45 y.o. female who presents for annual physical exam.   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 She lives at home by herself.   Diet: She eats a variety of food. She does not like vegetables and fruits. She cooks a meal once a week and will eat off of it, the rest of the week she will eat [place] sandwiches and sausage biscuits. She drinks about one gallon of water a day.   Exercise: She is not currently exercising.   Menstrual cycle: Her LMP was 10/10/16. For the past 6 months, her cycles have been shorter in duration (2-3 days) lighter cycles which occur every 30 days. She is not followed by a gynecology. She has never been sexually active.   Diabetes: Was diagnosed in 2006, started medications in 2010. Was initially started on metformin 500 BID but then changed to 850 daily. She was increased in 2016 to '850mg'$  BID. Her last check up for diabetes was 07/2016.  She is checking blood sugars daily and they are running around 98-110. She is checking in the morning and at night before bed. Notes that her sugars are sometimes higher in the morning after she has eaten sugary food the night before. Has no side effects from the medication. Pt has polydipsia and fatigue. She checks her feet regularly. Her last eye exam was 08/2016 and it was normal.    HTN: Was diagnosed in 2006, started medications in 2010. Was initially started on metoprolol and HCTZ. Pt lost weight and then had the HCTZ dropped in 2012. Has not followed any one for blood pressure. Checks her bp three-four times a week. Typically runs 110/80 at home but when she comes to the doctor it typically is in 140s/80s. Has had her bp machine at home for six years. She has not had it calibrated. Has no side effects from the medication.  She just got through with school and notes she  feels really drained today. Has had four episodes of diarrhea yesterday. Has had only 3 small cups of water today. Denies decreased appetite, abdominal pain, nausea, vomiting, and fever. She has increased heartrate. Notes that this is typical for her but she does not feel it elevated like she typically can. Her last EKG was 08/2016 and was normal.   Last dental exam: Never Last vision exam: 08/2016 Last pap smear: 11/07/2008 Last mammogram: Never Last colonoscopy: Never Vaccinations      Tetanus: >10 years      Influenza: 08/22/16  Patient Active Problem List   Diagnosis Date Noted  . Viral sinusitis 08/23/2016  . Tachycardia 08/23/2016  . Environmental allergies 07/25/2014  . Headache, common migraine (occasionally w/ aura) 04/25/2013  . Diabetes ([place]) 09/26/2012  . HTN (hypertension) 09/26/2012  . Overweight (BMI 25.0-29.9) 09/26/2012  . Metabolic syndrome 07/62/2633  . Bipolar depression ([place])     Current Outpatient Prescriptions on File Prior to Visit  Medication Sig Dispense Refill  . acetaminophen (TYLENOL) 500 MG tablet Take 1,000 mg by mouth every 6 (six) hours as needed for mild pain.    . B Complex-C (B-COMPLEX WITH VITAMIN C) tablet Take 1 tablet by mouth daily.    . fluticasone (FLONASE) 50 MCG/ACT nasal spray Place 2 sprays into both nostrils daily. 16 g 12  . Ginkgo Biloba (GINKOBA PO) Take 1 tablet by  mouth 2 (two) times daily.    . Guaifenesin (MUCINEX MAXIMUM STRENGTH) 1200 MG TB12 Take 1 tablet (1,200 mg total) by mouth every 12 (twelve) hours as needed. (Patient taking differently: Take 1 tablet by mouth every 12 (twelve) hours as needed. For congestion) 14 tablet 1  . lisinopril (ZESTRIL) 2.5 MG tablet Take 1 tablet (2.5 mg total) by mouth daily. 30 tablet 5  . loratadine (CLARITIN) 10 MG tablet Take 1 tablet (10 mg total) by mouth daily. 14 tablet 0  . metFORMIN (GLUCOPHAGE) 850 MG tablet Take 1 tablet twice daily with meals. 60 tablet 5  . metoprolol succinate  (TOPROL-XL) 50 MG 24 hr tablet Take 1 tablet (50 mg total) by mouth every evening. 30 tablet 5  . Multiple Vitamin (MULTIVITAMIN WITH MINERALS) TABS tablet Take 1 tablet by mouth daily.    . Omega-3 Fatty Acids (FISH OIL) 1000 MG CPDR Take by mouth.    . paliperidone (INVEGA) 6 MG 24 hr tablet Take 12 mg by mouth at bedtime.    . sertraline (ZOLOFT) 100 MG tablet Take 150 mg by mouth daily.     . vitamin E 400 UNIT capsule Take 400 Units by mouth daily.     No current facility-administered medications on file prior to visit.     Allergies  Allergen Reactions  . Tomato     Throat break out   . Penicillins Other (See Comments)    Childhood reaction Has patient had a PCN reaction causing immediate rash, facial/tongue/throat swelling, SOB or lightheadedness with hypotension: YES Has patient had a PCN reaction causing severe rash involving mucus membranes or skin necrosis: NO Has patient had a PCN reaction that required hospitalizationNO Has patient had a PCN reaction occurring within the last 10 years: NO If all of the above answers are "NO", then may proceed with Cephalosporin use.    Social History   Social History  . Marital status: Single    Spouse name: N/A  . Number of children: 0  . Years of education: N/A   Occupational History  . cashier-part time     [employer]  .  [employer]  . student     medical office administration [place]   Social History Main Topics  . Smoking status: Never Smoker  . Smokeless tobacco: Never Used  . Alcohol use No  . Drug use: No  . Sexual activity: Not Asked     Comment: number of sex partners in the last 12 months  0   Other Topics Concern  . None   Social History Narrative   Lives alone. Sister lives in [place].    No past surgical history on file.  Family History  Problem Relation Age of Onset  . Arthritis Mother   . Hypertension Mother   . Heart disease Mother   . Arthritis Father     rheumatoid  . Hypertension Father     . Hypertension Sister   . Thyroid disease Sister   . Congestive Heart Failure Maternal Grandfather   . Cancer Paternal Grandmother   . Heart disease Paternal Grandfather     Review of Systems  Constitutional: Positive for fatigue. Negative for activity change, appetite change, chills, diaphoresis, fever and unexpected weight change.  HENT: Positive for ear pain, postnasal drip and sinus pressure. Negative for congestion, dental problem, drooling, ear discharge, facial swelling, hearing loss, mouth sores, nosebleeds, rhinorrhea, sinus pain, sneezing, sore throat, tinnitus, trouble swallowing and voice change.   Eyes: Negative.  Respiratory: Negative.   Cardiovascular: Negative.   Gastrointestinal: Positive for diarrhea. Negative for abdominal distention, abdominal pain, anal bleeding, blood in stool, constipation, nausea, rectal pain and vomiting.  Endocrine: Positive for polydipsia. Negative for cold intolerance, heat intolerance, polyphagia and polyuria.  Genitourinary: Negative.   Musculoskeletal: Positive for arthralgias and back pain. Negative for gait problem, joint swelling, myalgias, neck pain and neck stiffness.  Skin: Negative.   Allergic/Immunologic: Positive for environmental allergies and food allergies. Negative for immunocompromised state.  Neurological: Negative.   Hematological: Negative.   Psychiatric/Behavioral: Positive for agitation. Negative for behavioral problems, confusion, decreased concentration, dysphoric mood, hallucinations, self-injury, sleep disturbance and suicidal ideas. The patient is nervous/anxious. The patient is not hyperactive.     Objective:  BP 134/80 (BP Location: Left Arm, Patient Position: Sitting, Cuff Size: Large)   Pulse 75   Temp 99.7 F (37.6 C)   Resp 18   Wt 172 lb (78 kg)   LMP 09/10/2016   SpO2 94%   BMI 31.97 kg/m   Physical Exam  Constitutional: She is oriented to person, place, and time and well-developed, well-nourished,  and in no distress.  HENT:  Head: Normocephalic and atraumatic.  Right Ear: Hearing, tympanic membrane, external ear and ear canal normal.  Left Ear: Hearing, tympanic membrane, external ear and ear canal normal.  Nose: Nose normal.  Mouth/Throat: Uvula is midline, oropharynx is clear and moist and mucous membranes are normal. No oropharyngeal exudate.  Eyes: Conjunctivae, EOM and lids are normal. Pupils are equal, round, and reactive to light. No scleral icterus.  Neck: Trachea normal and normal range of motion. No thyroid mass and no thyromegaly present.  Cardiovascular: Normal rate, regular rhythm, normal heart sounds and intact distal pulses.   Pulmonary/Chest: Effort normal and breath sounds normal. Right breast exhibits no inverted nipple, no mass, no nipple discharge, no skin change and no tenderness. Left breast exhibits no inverted nipple, no mass, no nipple discharge, no skin change and no tenderness. Breasts are symmetrical.  Abdominal: Soft. Normal appearance and bowel sounds are normal. There is no tenderness.  Genitourinary: Vagina normal, uterus normal, right adnexa normal, left adnexa normal and vulva normal. Cervix exhibits lesion (1 cm cherry-red growth noted protruding from cervical os).  Lymphadenopathy:       Head (right side): No submental, no submandibular, no tonsillar, no preauricular, no posterior auricular and no occipital adenopathy present.       Head (left side): No submental, no submandibular, no tonsillar, no preauricular, no posterior auricular and no occipital adenopathy present.    She has no cervical adenopathy.       Right: No supraclavicular adenopathy present.       Left: No supraclavicular adenopathy present.  Neurological: She is alert and oriented to person, place, and time. She has normal sensation, normal strength and normal reflexes. Gait normal.  Skin: Skin is warm and dry.  Psychiatric: Affect normal.    Visual Acuity Screening   Right eye Left  eye Both eyes  Without correction:     With correction: '20/20 20/20 20/20 '$   Pulse Readings from Last 3 Encounters:  10/19/16 75  10/05/16 96  08/26/16 90   Results for orders placed or performed in visit on 10/19/16 (from the past 24 hour(s))  POCT CBC     Status: Abnormal   Collection Time: 10/19/16  4:38 PM  Result Value Ref Range   WBC 7.8 4.6 - 10.2 K/uL   Lymph, poc 2.0 0.6 - 3.4  POC LYMPH PERCENT 25.3 10 - 50 %L   MID (cbc) 0.6 0 - 0.9   POC MID % 7.5 0 - 12 %M   POC Granulocyte 5.2 2 - 6.9   Granulocyte percent 67.2 37 - 80 %G   RBC 4.49 4.04 - 5.48 M/uL   Hemoglobin 13.6 12.2 - 16.2 g/dL   HCT, POC 38.0 37.7 - 47.9 %   MCV 84.6 80 - 97 fL   MCH, POC 30.3 27 - 31.2 pg   MCHC 35.8 (A) 31.8 - 35.4 g/dL   RDW, POC 13.1 %   Platelet Count, POC 245 142 - 424 K/uL   MPV 6.6 0 - 99.8 fL  POCT urinalysis dipstick     Status: Abnormal   Collection Time: 10/19/16  4:50 PM  Result Value Ref Range   Color, UA yellow yellow   Clarity, UA clear clear   Glucose, UA negative negative   Bilirubin, UA negative negative   Ketones, POC UA trace (5) (A) negative   Spec Grav, UA 1.025    Blood, UA negative negative   pH, UA 5.5    Protein Ur, POC negative negative   Urobilinogen, UA 0.2    Nitrite, UA Negative Negative   Leukocytes, UA Negative Negative  POCT UA - Microscopic Only     Status: None   Collection Time: 10/19/16  5:07 PM  Result Value Ref Range   WBC, Ur, HPF, POC none    RBC, urine, microscopic none    Bacteria, U Microscopic none    Mucus, UA none    Epithelial cells, urine per micros few    Crystals, Ur, HPF, POC many    Casts, Ur, LPF, POC none    Yeast, UA absent   POCT Influenza A/B     Status: Normal   Collection Time: 10/19/16  5:18 PM  Result Value Ref Range   Influenza A, POC Negative Negative   Influenza B, POC Negative Negative   HR decreased from 121 to 75 post oral fluid consumption.   Assessment and Plan :  Discussed healthy lifestyle,  diet, exercise, preventative care, vaccinations, and addressed patient's concerns. Plan for follow up in three months. Otherwise, plan for specific conditions below. 1. Annual physical exam Await results  2. Type 2 diabetes mellitus without complication, without long-term current use of insulin (HCC) Follow up in three months - Lipid panel - CMP14+EGFR - Hemoglobin A1c - POCT urinalysis dipstick - POCT UA - Microscopic Only  3. Essential hypertension Follow up in three months  4. Tachycardia Resolved with oral fluids - POCT Influenza A/B - POCT CBC  5. Screening for cervical cancer - Pap IG and HPV (high risk) DNA detection  6. Family history of skin cancer - Ambulatory referral to Dermatology  7. Abnormal pelvic exam - Ambulatory referral to Gynecology  Tenna Delaine PA-C  [facility] 10/19/2016 5:20 PM

## 2016-10-19 NOTE — Patient Instructions (Addendum)
I have made a referral for both dermatology and gynecology and you should hear from them in the next two weeks. Follow up in three months for diabetes check. I will refill your medication for diabetes and htn at this time. I will contact you with your lab results. Please follow up as needed for any acute issues.   Dehydration, Adult Dehydration is when there is not enough fluid or water in your body. This happens when you lose more fluids than you take in. Dehydration can range from mild to very bad. It should be treated right away to keep it from getting very bad. Symptoms of mild dehydration may include:  Thirst.  Dry lips.  Slightly dry mouth.  Dry, warm skin.  Dizziness. Symptoms of moderate dehydration may include:  Very dry mouth.  Muscle cramps.  Dark pee (urine). Pee may be the color of tea.  Your body making less pee.  Your eyes making fewer tears.  Heartbeat that is uneven or faster than normal (palpitations).  Headache.  Light-headedness, especially when you stand up from sitting.  Fainting (syncope). Symptoms of very bad dehydration may include:  Changes in skin, such as:  Cold and clammy skin.  Blotchy (mottled) or pale skin.  Skin that does not quickly return to normal after being lightly pinched and let go (poor skin turgor).  Changes in body fluids, such as:  Feeling very thirsty.  Your eyes making fewer tears.  Not sweating when body temperature is high, such as in hot weather.  Your body making very little pee.  Changes in vital signs, such as:  Weak pulse.  Pulse that is more than 100 beats a minute when you are sitting still.  Fast breathing.  Low blood pressure.  Other changes, such as:  Sunken eyes.  Cold hands and feet.  Confusion.  Lack of energy (lethargy).  Trouble waking up from sleep.  Short-term weight loss.  Unconsciousness. Follow these instructions at home:  If told by your doctor, drink an ORS:  Make  an ORS by using instructions on the package.  Start by drinking small amounts, about  cup (120 mL) every 5-10 minutes.  Slowly drink more until you have had the amount that your doctor said to have.  Drink enough clear fluid to keep your pee clear or pale yellow. If you were told to drink an ORS, finish the ORS first, then start slowly drinking clear fluids. Drink fluids such as:  Water. Do not drink only water by itself. Doing that can make the salt (sodium) level in your body get too low (hyponatremia).  Ice chips.  Fruit juice that you have added water to (diluted).  Low-calorie sports drinks.  Avoid:  Alcohol.  Drinks that have a lot of sugar. These include high-calorie sports drinks, fruit juice that does not have water added, and soda.  Caffeine.  Foods that are greasy or have a lot of fat or sugar.  Take over-the-counter and prescription medicines only as told by your doctor.  Do not take salt tablets. Doing that can make the salt level in your body get too high (hypernatremia).  Eat foods that have minerals (electrolytes). Examples include bananas, oranges, potatoes, tomatoes, and spinach.  Keep all follow-up visits as told by your doctor. This is important. Contact a doctor if:  You have belly (abdominal) pain that:  Gets worse.  Stays in one area (localizes).  You have a rash.  You have a stiff neck.  You get angry  or annoyed more easily than normal (irritability).  You are more sleepy than normal.  You have a harder time waking up than normal.  You feel:  Weak.  Dizzy.  Very thirsty.  You have peed (urinated) only a small amount of very dark pee during 6-8 hours. Get help right away if:  You have symptoms of very bad dehydration.  You cannot drink fluids without throwing up (vomiting).  Your symptoms get worse with treatment.  You have a fever.  You have a very bad headache.  You are throwing up or having watery poop (diarrhea) and  it:  Gets worse.  Does not go away.  You have blood or something green (bile) in your throw-up.  You have blood in your poop (stool). This may cause poop to look black and tarry.  You have not peed in 6-8 hours.  You pass out (faint).  Your heart rate when you are sitting still is more than 100 beats a minute.  You have trouble breathing. This information is not intended to replace advice given to you by your health care provider. Make sure you discuss any questions you have with your health care provider. Document Released: 08/20/2009 Document Revised: 05/13/2016 Document Reviewed: 12/18/2015 Elsevier Interactive Patient Education  2017 Else[employer] Inc. Heart-Healthy Eating Plan Introduction Heart-healthy meal planning includes:  Limiting unhealthy fats.  Increasing healthy fats.  Making other small dietary changes. You may need to talk with your doctor or a diet specialist (dietitian) to create an eating plan that is right for you. What types of fat should I choose?  Choose healthy fats. These include olive oil and canola oil, flaxseeds, walnuts, almonds, and seeds.  Eat more omega-3 fats. These include salmon, mackerel, sardines, tuna, flaxseed oil, and ground flaxseeds. Try to eat fish at least twice each week.  Limit saturated fats.  Saturated fats are often found in animal products, such as meats, butter, and cream.  Plant sources of saturated fats include palm oil, palm kernel oil, and coconut oil.  Avoid foods with partially hydrogenated oils in them. These include stick margarine, some tub margarines, cookies, crackers, and other baked goods. These contain trans fats. What general guidelines do I need to follow?  Check food labels carefully. Identify foods with trans fats or high amounts of saturated fat.  Fill one half of your plate with vegetables and green salads. Eat 4-5 servings of vegetables per day. A serving of vegetables is:  1 cup of raw leafy  vegetables.   cup of raw or cooked cut-up vegetables.   cup of vegetable juice.  Fill one fourth of your plate with whole grains. Look for the word "whole" as the first word in the ingredient list.  Fill one fourth of your plate with lean protein foods.  Eat 4-5 servings of fruit per day. A serving of fruit is:  One medium whole fruit.   cup of dried fruit.   cup of fresh, frozen, or canned fruit.   cup of 100% fruit juice.  Eat more foods that contain soluble fiber. These include apples, broccoli, carrots, beans, peas, and barley. Try to get 20-30 g of fiber per day.  Eat more home-cooked food. Eat less restaurant, buffet, and fast food.  Limit or avoid alcohol.  Limit foods high in starch and sugar.  Avoid fried foods.  Avoid frying your food. Try baking, boiling, grilling, or broiling it instead. You can also reduce fat by:  Removing the skin from poultry.  Removing  all visible fats from meats.  Skimming the fat off of stews, soups, and gravies before serving them.  Steaming vegetables in water or broth.  Lose weight if you are overweight.  Eat 4-5 servings of nuts, legumes, and seeds per week:  One serving of dried beans or legumes equals  cup after being cooked.  One serving of nuts equals 1 ounces.  One serving of seeds equals  ounce or one tablespoon.  You may need to keep track of how much salt or sodium you eat. This is especially true if you have high blood pressure. Talk with your doctor or dietitian to get more information. What foods can I eat? Grains  Breads, including [place], white, pita, wheat, raisin, rye, oatmeal, and [place]. Tortillas that are neither fried nor made with lard or trans fat. Low-fat rolls, including hotdog and hamburger buns and English muffins. Biscuits. Muffins. Waffles. Pancakes. Light popcorn. Whole-grain cereals. Flatbread. Melba toast. Pretzels. Breadsticks. Rusks. Low-fat snacks. Low-fat crackers, including oyster,  saltine, matzo, graham, animal, and rye. Rice and pasta, including brown rice and pastas that are made with whole wheat. Vegetables  All vegetables. Fruits  All fruits, but limit coconut. Meats and Other Protein Sources  Lean, well-trimmed beef, veal, pork, and lamb. Chicken and [place] without skin. All fish and shellfish. Wild duck, rabbit, pheasant, and venison. Egg whites or low-cholesterol egg substitutes. Dried beans, peas, lentils, and tofu. Seeds and most nuts. Dairy  Low-fat or nonfat cheeses, including ricotta, string, and mozzarella. Skim or 1% milk that is liquid, powdered, or evaporated. Buttermilk that is made with low-fat milk. Nonfat or low-fat yogurt. Beverages  Mineral water. Diet carbonated beverages. Sweets and Desserts  Sherbets and fruit ices. Honey, jam, marmalade, jelly, and syrups. Meringues and gelatins. Pure sugar candy, such as hard candy, jelly beans, gumdrops, mints, marshmallows, and small amounts of dark chocolate. [employer]. Eat all sweets and desserts in moderation. Fats and Oils  Nonhydrogenated (trans-free) margarines. Vegetable oils, including soybean, sesame, sunflower, olive, peanut, safflower, corn, canola, and cottonseed. Salad dressings or mayonnaise made with a vegetable oil. Limit added fats and oils that you use for cooking, baking, salads, and as spreads. Other  Cocoa powder. Coffee and tea. All seasonings and condiments. The items listed above may not be a complete list of recommended foods or beverages. Contact your dietitian for more options.  What foods are not recommended? Grains  Breads that are made with saturated or trans fats, oils, or whole milk. Croissants. Butter rolls. Cheese breads. Sweet rolls. Donuts. Buttered popcorn. Chow mein noodles. High-fat crackers, such as cheese or butter crackers. Meats and Other Protein Sources  Fatty meats, such as hotdogs, short ribs, sausage, spareribs, bacon, rib eye roast or steak, and mutton.  High-fat deli meats, such as salami and bologna. Caviar. Domestic duck and goose. Organ meats, such as kidney, liver, sweetbreads, and heart. Dairy  Cream, sour cream, cream cheese, and creamed cottage cheese. Whole-milk cheeses, including blue (bleu), [address], [place], [place], [address], [place], [address], cheddar, [place], and [place]. Whole or 2% milk that is liquid, evaporated, or condensed. Whole buttermilk. Cream sauce or high-fat cheese sauce. Yogurt that is made from whole milk. Beverages  Regular sodas and juice drinks with added sugar. Sweets and Desserts  Frosting. Pudding. Cookies. Cakes other than angel food cake. Candy that has milk chocolate or white chocolate, hydrogenated fat, butter, coconut, or unknown ingredients. Buttered syrups. Full-fat ice cream or ice cream drinks. Fats and Oils  Gravy that  has suet, meat fat, or shortening. Cocoa butter, hydrogenated oils, palm oil, coconut oil, palm kernel oil. These can often be found in baked products, candy, fried foods, nondairy creamers, and whipped toppings. Solid fats and shortenings, including bacon fat, salt pork, lard, and butter. Nondairy cream substitutes, such as coffee creamers and sour cream substitutes. Salad dressings that are made of unknown oils, cheese, or sour cream. The items listed above may not be a complete list of foods and beverages to avoid. Contact your dietitian for more information.  This information is not intended to replace advice given to you by your health care provider. Make sure you discuss any questions you have with your health care provider. Document Released: 04/24/2012 Document Revised: 03/31/2016 Document Reviewed: 04/17/2014  2017 Elsevier     IF you received an x-ray today, you will receive an invoice from [facility]. Please contact [facility] at [phone] with questions or concerns regarding your invoice.   IF you received labwork today, you will receive an invoice  from [facility]. Please contact [facility] at [phone] with questions or concerns regarding your invoice.   Our billing staff will not be able to assist you with questions regarding bills from these companies.  You will be contacted with the lab results as soon as they are available. The fastest way to get your results is to activate your My Chart account. Instructions are located on the last page of this paperwork. If you have not heard from us regarding the results in 2 weeks, please contact this office.

## 2016-10-20 LAB — CMP14+EGFR
ALK PHOS: 74 IU/L (ref 39–117)
ALT: 39 IU/L — ABNORMAL HIGH (ref 0–32)
AST: 26 IU/L (ref 0–40)
Albumin/Globulin Ratio: 2 (ref 1.2–2.2)
Albumin: 4.6 g/dL (ref 3.5–5.5)
BUN/Creatinine Ratio: 11 (ref 9–23)
BUN: 6 mg/dL (ref 6–24)
Bilirubin Total: 0.2 mg/dL (ref 0.0–1.2)
CO2: 23 mmol/L (ref 18–29)
CREATININE: 0.56 mg/dL — AB (ref 0.57–1.00)
Calcium: 9.7 mg/dL (ref 8.7–10.2)
Chloride: 101 mmol/L (ref 96–106)
GFR calc Af Amer: 128 mL/min/{1.73_m2} (ref >59)
GFR calc non Af Amer: 111 mL/min/{1.73_m2} (ref >59)
GLUCOSE: 116 mg/dL — AB (ref 65–99)
Globulin, Total: 2.3 g/dL (ref 1.5–4.5)
Potassium: 4.4 mmol/L (ref 3.5–5.2)
SODIUM: 141 mmol/L (ref 134–144)
Total Protein: 6.9 g/dL (ref 6.0–8.5)

## 2016-10-20 LAB — HEMOGLOBIN A1C
ESTIMATED AVERAGE GLUCOSE: 126 mg/dL
HEMOGLOBIN A1C: 6 % — AB (ref 4.8–5.6)

## 2016-10-20 LAB — LIPID PANEL
CHOL/HDL RATIO: 3.4 ratio (ref 0.0–4.4)
Cholesterol, Total: 192 mg/dL (ref 100–199)
HDL: 57 mg/dL (ref >39)
LDL CALC: 105 mg/dL — AB (ref 0–99)
Triglycerides: 152 mg/dL — ABNORMAL HIGH (ref 0–149)
VLDL CHOLESTEROL CAL: 30 mg/dL (ref 5–40)

## 2016-10-21 LAB — PAP IG AND HPV HIGH-RISK
HPV, high-risk: NEGATIVE
PAP SMEAR COMMENT: 0

## 2016-10-25 ENCOUNTER — Telehealth: Payer: Self-pay | Admitting: Physician Assistant

## 2016-10-25 MED ORDER — ATORVASTATIN CALCIUM 10 MG PO TABS: 10.0000 mg | ORAL_TABLET | Freq: Every day | ORAL | 0 refills | Status: DC

## 2016-10-25 NOTE — Telephone Encounter (Signed)
Pt contacted and informed of lab results. Instructed that her LDL was slightly elevated and due to her diagnosis of diabetes, I will start her on lipitor 10mg  daily. Also informed that her ALT was slightly elevated but lower than it was two months ago. She informed me that she used to have elevated liver enzymes in highschool but her work up was negative. Since statins work on the liver, I will follow this value at her follow up appointment in 3 months. Instructed to avoid all alcohol and tylenol during this time. Pt understands and agrees.

## 2016-11-09 ENCOUNTER — Ambulatory Visit (INDEPENDENT_AMBULATORY_CARE_PROVIDER_SITE_OTHER): Payer: No Typology Code available for payment source | Admitting: Physician Assistant

## 2016-11-09 VITALS — BP 124/82 | HR 86 | Temp 98.2°F | Resp 20 | Ht 61.5 in | Wt 171.0 lb

## 2016-11-09 DIAGNOSIS — R05 Cough: Secondary | ICD-10-CM

## 2016-11-09 DIAGNOSIS — R059 Cough, unspecified: Secondary | ICD-10-CM

## 2016-11-09 DIAGNOSIS — R0981 Nasal congestion: Secondary | ICD-10-CM

## 2016-11-09 MED ORDER — DOXYCYCLINE HYCLATE 100 MG PO CAPS: 100.0000 mg | ORAL_CAPSULE | Freq: Two times a day (BID) | ORAL | 0 refills | Status: DC

## 2016-11-09 MED ORDER — HYDROCOD POLST-CPM POLST ER 10-8 MG/5ML PO SUER: 5.0000 mL | Freq: Two times a day (BID) | ORAL | 0 refills | Status: DC | PRN

## 2016-11-09 MED ORDER — BENZONATATE 100 MG PO CAPS: 100.0000 mg | ORAL_CAPSULE | Freq: Three times a day (TID) | ORAL | 0 refills | Status: DC | PRN

## 2016-11-09 NOTE — Progress Notes (Signed)
MRN: [id number] DOB: 1971-03-18  Subjective:   Elizabeth [name] is a 46 y.o. female presenting for chief complaint of Cough and Chest congestion .  Reports 2 week history of sore throat, productive cough with brown sputum,  sinus pain, ear pain, wheezing and chest tightness. The cough is the most irritating symptom for her.  Has tried diabetic tussin, mucinex, flonase, and claritin relief. Denies fever, shortness of breath, chest pain and myalgia, night sweats, chills, nausea, vomiting and abdominal pain. Has had  sick contact with family members. Has history of seasonal allergies; no history ory of asthma. Patient has had the flu shot this season. No smoking or alcohol use. Denies any other aggravating or relieving factors, no other questions or concerns.  Elizabeth [name] has a current medication list which includes the following prescription(s): atorvastatin, b-complex with vitamin c, fluticasone, ginkgo biloba, guaifenesin, ibuprofen, lisinopril, loratadine, metformin, metoprolol succinate, multivitamin with minerals, fish oil, paliperidone, vitamin e, benzonatate, chlorpheniramine-hydrocodone, doxycycline, and sertraline. Also is allergic to tomato and penicillins.  Elizabeth [name]  has a past medical history of Allergy; Anxiety; Bipolar depression (HCC); Diabetes mellitus without complication (HCC); and Hypertension. Also  has no past surgical history on file.   Objective:   Vitals: BP 124/82   Pulse 86   Temp 98.2 F (36.8 C) (Oral)   Resp 20   Ht 5' 1.5" (1.562 m)   Wt 171 lb (77.6 kg)   SpO2 96%   BMI 31.79 kg/m   Physical Exam  Constitutional: She is oriented to person, place, and time. She appears well-developed and well-nourished.  HENT:  Head: Normocephalic and atraumatic.  Right Ear: Tympanic membrane, external ear and ear canal normal.  Left Ear: Tympanic membrane, external ear and ear canal normal.  Nose: Mucosal edema and rhinorrhea present. Right sinus exhibits maxillary sinus  tenderness ( mild) and frontal sinus tenderness (mild). Left sinus exhibits maxillary sinus tenderness ( mild) and frontal sinus tenderness ( mild).  Eyes: Conjunctivae are normal.  Neck: Normal range of motion.  Pulmonary/Chest: Effort normal and breath sounds normal.  Lymphadenopathy:       Head (right side): No submental, no submandibular, no tonsillar, no preauricular, no posterior auricular and no occipital adenopathy present.       Head (left side): No submental, no submandibular, no tonsillar, no preauricular, no posterior auricular and no occipital adenopathy present.    She has no cervical adenopathy.       Right: No supraclavicular adenopathy present.       Left: No supraclavicular adenopathy present.  Neurological: She is alert and oriented to person, place, and time.  Skin: Skin is warm and dry.  Psychiatric: She has a normal mood and affect.  Vitals reviewed.  No results found for this or any previous visit (from the past 24 hour(s)).  Assessment and Plan :  1. Cough - chlorpheniramine-HYDROcodone (TUSSIONEX PENNKINETIC ER) 10-8 MG/5ML SUER; Take 5 mLs by mouth every 12 (twelve) hours as needed for cough.  Dispense: 100 mL; Refill: 0 - benzonatate (TESSALON) 100 MG capsule; Take 1-2 capsules (100-200 mg total) by mouth 3 (three) times daily as needed for cough.  Dispense: 40 capsule; Refill: 0 - doxycycline (VIBRAMYCIN) 100 MG capsule; Take 1 capsule (100 mg total) by mouth 2 (two) times daily.  Dispense: 20 capsule; Refill: 0 2. Sinus congestion  -Due to pt's duration of symptoms, will cover for underlying bacterial etiology. Pt has refused CXR imaging at this time as she does not have insurance.  Will also treat symptoms. Pt instructed to return to clinic if symptoms worsen, do not improve in 10 days, or as needed   Benjiman CoreBrittany Dadrian Ballantine, PA-C  [facility] 11/09/2016 9:07 AM

## 2016-11-09 NOTE — Patient Instructions (Addendum)
-   We will treat this as a respiratory infection with possible bacterial etiology.  - I recommend you rest, drink plenty of fluids, eat light meals including soups.  - You can use OTC 12 hour sudafed for head congestion.  - You may use cough syrup at night for your cough and sore throat, Tessalon pearls during the day. Be aware that cough syrup can definitely make you drowsy and sleepy so do not drive or operate any heavy machinery if it is affecting you during the day.  - If you feel as if you are not any better with this treatment in the next couple of days you can pick up the antibiotic to take.  - You may also use ibuprofen over-the-counter for your sore throat.  - Please let me know if you are not seeing any improvement or get worse.  Thank you for letting me participate in your health and well being.      IF you received an x-ray today, you will receive an invoice from [facility]. Please contact [facility] at [phone] with questions or concerns regarding your invoice.   IF you received labwork today, you will receive an invoice from [facility]. Please contact [facility] at [phone] with questions or concerns regarding your invoice.   Our billing staff will not be able to assist you with questions regarding bills from these companies.  You will be contacted with the lab results as soon as they are available. The fastest way to get your results is to activate your My Chart account. Instructions are located on the last page of this paperwork. If you have not heard from us regarding the results in 2 weeks, please contact this office.

## 2016-11-16 ENCOUNTER — Encounter: Payer: Self-pay | Admitting: Women's Health

## 2016-11-16 ENCOUNTER — Ambulatory Visit (INDEPENDENT_AMBULATORY_CARE_PROVIDER_SITE_OTHER): Payer: Self-pay | Admitting: Women's Health

## 2016-11-16 VITALS — BP 124/78 | Ht 61.0 in | Wt 172.0 lb

## 2016-11-16 DIAGNOSIS — N841 Polyp of cervix uteri: Secondary | ICD-10-CM

## 2016-11-16 NOTE — Addendum Note (Signed)
Addended by: Kem ParkinsonBARNES, Gentry Pilson on: 11/16/2016 02:41 PM   Modules accepted: Orders

## 2016-11-16 NOTE — Progress Notes (Signed)
Presents as a patient with a problem visit. Had a normal Pap with negative HR HPV typing 10/19/2016 at  primary care. Was noted to have a cervical polyp and was referred here. Has a monthly 2-3 day cycle/ no spotting or irregular bleeding. [place]. Significant history hypertension and diabetes for 10 years, managed by primary care. Obesity. Has not had a screening mammogram.   Plan: Appears well. Heart regular rate and rhythm, lungs clear, breast exam in sitting and lying position without visible retractions, erythema or palpable nodules. Abdomen soft, obese. External genitalia within normal limits, speculum exam no visible discharge, 0.5 cm endocervical polyp noted, removed with ease with ring forcep sent for pathology. Bimanual no CMT or tenderness, limited exam due to obesity.  Endocervical polyp Hypertension/diabetes-primary care manages labs and meds  Plan: Reviewed importance of increasing regular exercise, decreasing calories for weight loss, decreasing carbs in diet and avoiding frozen foods meals and fast food. If polyp negative, will watch, reviewed if any irregular bleeding, spotting, to call will schedule sonohysterogram.

## 2016-11-16 NOTE — Patient Instructions (Signed)
[facility]  [phone]  Carbohydrate Counting for Diabetes Mellitus, Adult Carbohydrate counting is a method for keeping track of how many carbohydrates you eat. Eating carbohydrates naturally increases the amount of sugar (glucose) in the blood. Counting how many carbohydrates you eat helps keep your blood glucose within normal limits, which helps you manage your diabetes (diabetes mellitus). It is important to know how many carbohydrates you can safely have in each meal. This is different for every person. A diet and nutrition specialist (registered dietitian) can help you make a meal plan and calculate how many carbohydrates you should have at each meal and snack. Carbohydrates are found in the following foods:  Grains, such as breads and cereals.  Dried beans and soy products.  Starchy vegetables, such as potatoes, peas, and corn.  Fruit and fruit juices.  Milk and yogurt.  Sweets and snack foods, such as cake, cookies, candy, chips, and soft drinks. How do I count carbohydrates? There are two ways to count carbohydrates in food. You can use either of the methods or a combination of both. Reading "Nutrition Facts" on packaged food  The "Nutrition Facts" list is included on the labels of almost all packaged foods and beverages in the [place] It includes:  The serving size.  Information about nutrients in each serving, including the grams (g) of carbohydrate per serving. To use the "Nutrition Facts":  Decide how many servings you will have.  Multiply the number of servings by the number of carbohydrates per serving.  The resulting number is the total amount of carbohydrates that you will be having. Learning standard serving sizes of other foods  When you eat foods containing carbohydrates that are not packaged or do not include "Nutrition Facts" on the label, you need to measure the servings in order to count the amount of carbohydrates:  Measure the foods that you will eat with a  food scale or measuring cup, if needed.  Decide how many standard-size servings you will eat.  Multiply the number of servings by 15. Most carbohydrate-rich foods have about 15 g of carbohydrates per serving.  For example, if you eat 8 oz (170 g) of strawberries, you will have eaten 2 servings and 30 g of carbohydrates (2 servings x 15 g = 30 g).  For foods that have more than one food mixed, such as soups and casseroles, you must count the carbohydrates in each food that is included. The following list contains standard serving sizes of common carbohydrate-rich foods. Each of these servings has about 15 g of carbohydrates:   hamburger bun or  English muffin.   oz (15 mL) syrup.   oz (14 g) jelly.  1 slice of bread.  1 six-inch tortilla.  3 oz (85 g) cooked rice or pasta.  4 oz (113 g) cooked dried beans.  4 oz (113 g) starchy vegetable, such as peas, corn, or potatoes.  4 oz (113 g) hot cereal.  4 oz (113 g) mashed potatoes or  of a large baked potato.  4 oz (113 g) canned or frozen fruit.  4 oz (120 mL) fruit juice.  4-6 crackers.  6 chicken nuggets.  6 oz (170 g) unsweetened dry cereal.  6 oz (170 g) plain fat-free yogurt or yogurt sweetened with artificial sweeteners.  8 oz (240 mL) milk.  8 oz (170 g) fresh fruit or one small piece of fruit.  24 oz (680 g) popped popcorn. Example of carbohydrate counting Sample meal  3 oz (85 g)  chicken breast.  6 oz (170 g) brown rice.  4 oz (113 g) corn.  8 oz (240 mL) milk.  8 oz (170 g) strawberries with sugar-free whipped topping. Carbohydrate calculation 1. Identify the foods that contain carbohydrates:  Rice.  Corn.  Milk.  Strawberries. 2. Calculate how many servings you have of each food:  2 servings rice.  1 serving corn.  1 serving milk.  1 serving strawberries. 3. Multiply each number of servings by 15 g:  2 servings rice x 15 g = 30 g.  1 serving corn x 15 g = 15 g.  1 serving  milk x 15 g = 15 g.  1 serving strawberries x 15 g = 15 g. 4. Add together all of the amounts to find the total grams of carbohydrates eaten:  30 g + 15 g + 15 g + 15 g = 75 g of carbohydrates total. This information is not intended to replace advice given to you by your health care provider. Make sure you discuss any questions you have with your health care provider. Document Released: 10/24/2005 Document Revised: 05/13/2016 Document Reviewed: 04/06/2016 Elsevier Interactive Patient Education  2017 [place] [employer], Female Introduction Adopting a healthy lifestyle and getting preventive care can go a long way to promote health and wellness. Talk with your health care provider about what schedule of regular examinations is right for you. This is a good chance for you to check in with your provider about disease prevention and staying healthy. In between checkups, there are plenty of things you can do on your own. Experts have done a lot of research about which lifestyle changes and preventive measures are most likely to keep you healthy. Ask your health care provider for more information. Weight and diet Eat a healthy diet  Be sure to include plenty of vegetables, fruits, low-fat dairy products, and lean protein.  Do not eat a lot of foods high in solid fats, added sugars, or salt.  Get regular exercise. This is one of the most important things you can do for your health.  Most adults should exercise for at least 150 minutes each week. The exercise should increase your heart rate and make you sweat (moderate-intensity exercise).  Most adults should also do strengthening exercises at least twice a week. This is in addition to the moderate-intensity exercise. Maintain a healthy weight  Body mass index (BMI) is a measurement that can be used to identify possible weight problems. It estimates body fat based on height and weight. Your health care provider can help determine  your BMI and help you achieve or maintain a healthy weight.  For females 48 years of age and older:  A BMI below 18.5 is considered underweight.  A BMI of 18.5 to 24.9 is normal.  A BMI of 25 to 29.9 is considered overweight.  A BMI of 30 and above is considered obese. Watch levels of cholesterol and blood lipids  You should start having your blood tested for lipids and cholesterol at 48 years of age, then have this test every 5 years.  You may need to have your cholesterol levels checked more often if:  Your lipid or cholesterol levels are high.  You are older than 48 years of age.  You are at high risk for heart disease. Cancer screening Lung Cancer  Lung cancer screening is recommended for adults 53-41 years old who are at high risk for lung cancer because of a history of smoking.  A yearly low-dose CT scan of the lungs is recommended for people who:  Currently smoke.  Have quit within the past 15 years.  Have at least a 30-pack-year history of smoking. A pack year is smoking an average of one pack of cigarettes a day for 1 year.  Yearly screening should continue until it has been 15 years since you quit.  Yearly screening should stop if you develop a health problem that would prevent you from having lung cancer treatment. Breast Cancer  Practice breast self-awareness. This means understanding how your breasts normally appear and feel.  It also means doing regular breast self-exams. Let your health care provider know about any changes, no matter how small.  If you are in your 20s or 30s, you should have a clinical breast exam (CBE) by a health care provider every 1-3 years as part of a regular health exam.  If you are 72 or older, have a CBE every year. Also consider having a breast X-ray (mammogram) every year.  If you have a family history of breast cancer, talk to your health care provider about genetic screening.  If you are at high risk for breast cancer,  talk to your health care provider about having an MRI and a mammogram every year.  Breast cancer gene (BRCA) assessment is recommended for women who have family members with BRCA-related cancers. BRCA-related cancers include:  Breast.  Ovarian.  Tubal.  Peritoneal cancers.  Results of the assessment will determine the need for genetic counseling and BRCA1 and BRCA2 testing. Cervical Cancer  Your health care provider may recommend that you be screened regularly for cancer of the pelvic organs (ovaries, uterus, and vagina). This screening involves a pelvic examination, including checking for microscopic changes to the surface of your cervix (Pap test). You may be encouraged to have this screening done every 3 years, beginning at age 40.  For women ages 50-65, health care providers may recommend pelvic exams and Pap testing every 3 years, or they may recommend the Pap and pelvic exam, combined with testing for human papilloma virus (HPV), every 5 years. Some types of HPV increase your risk of cervical cancer. Testing for HPV may also be done on women of any age with unclear Pap test results.  Other health care providers may not recommend any screening for nonpregnant women who are considered low risk for pelvic cancer and who do not have symptoms. Ask your health care provider if a screening pelvic exam is right for you.  If you have had past treatment for cervical cancer or a condition that could lead to cancer, you need Pap tests and screening for cancer for at least 20 years after your treatment. If Pap tests have been discontinued, your risk factors (such as having a new sexual partner) need to be reassessed to determine if screening should resume. Some women have medical problems that increase the chance of getting cervical cancer. In these cases, your health care provider may recommend more frequent screening and Pap tests. Colorectal Cancer  This type of cancer can be detected and often  prevented.  Routine colorectal cancer screening usually begins at 48 years of age and continues through 48 years of age.  Your health care provider may recommend screening at an earlier age if you have risk factors for colon cancer.  Your health care provider may also recommend using home test kits to check for hidden blood in the stool.  A small camera at the end of a  tube can be used to examine your colon directly (sigmoidoscopy or colonoscopy). This is done to check for the earliest forms of colorectal cancer.  Routine screening usually begins at age 21.  Direct examination of the colon should be repeated every 5-10 years through 48 years of age. However, you may need to be screened more often if early forms of precancerous polyps or small growths are found. Skin Cancer  Check your skin from head to toe regularly.  Tell your health care provider about any new moles or changes in moles, especially if there is a change in a mole's shape or color.  Also tell your health care provider if you have a mole that is larger than the size of a pencil eraser.  Always use sunscreen. Apply sunscreen liberally and repeatedly throughout the day.  Protect yourself by wearing long sleeves, pants, a wide-brimmed hat, and sunglasses whenever you are outside. Heart disease, diabetes, and high blood pressure  High blood pressure causes heart disease and increases the risk of stroke. High blood pressure is more likely to develop in:  People who have blood pressure in the high end of the normal range (130-139/85-89 mm Hg).  People who are overweight or obese.  People who are African American.  If you are 85-44 years of age, have your blood pressure checked every 3-5 years. If you are 70 years of age or older, have your blood pressure checked every year. You should have your blood pressure measured twice-once when you are at a hospital or clinic, and once when you are not at a hospital or clinic. Record  the average of the two measurements. To check your blood pressure when you are not at a hospital or clinic, you can use:  An automated blood pressure machine at a pharmacy.  A home blood pressure monitor.  If you are between 85 years and 40 years old, ask your health care provider if you should take aspirin to prevent strokes.  Have regular diabetes screenings. This involves taking a blood sample to check your fasting blood sugar level.  If you are at a normal weight and have a low risk for diabetes, have this test once every three years after 48 years of age.  If you are overweight and have a high risk for diabetes, consider being tested at a younger age or more often. Preventing infection Hepatitis B  If you have a higher risk for hepatitis B, you should be screened for this virus. You are considered at high risk for hepatitis B if:  You were born in a country where hepatitis B is common. Ask your health care provider which countries are considered high risk.  Your parents were born in a high-risk country, and you have not been immunized against hepatitis B (hepatitis B vaccine).  You have HIV or AIDS.  You use needles to inject street drugs.  You live with someone who has hepatitis B.  You have had sex with someone who has hepatitis B.  You get hemodialysis treatment.  You take certain medicines for conditions, including cancer, organ transplantation, and autoimmune conditions. Hepatitis C  Blood testing is recommended for:  Everyone born from [birth year] through 1967.  Anyone with known risk factors for hepatitis C. Sexually transmitted infections (STIs)  You should be screened for sexually transmitted infections (STIs) including gonorrhea and chlamydia if:  You are sexually active and are younger than 48 years of age.  You are older than 48 years of age and  your health care provider tells you that you are at risk for this type of infection.  Your sexual activity has  changed since you were last screened and you are at an increased risk for chlamydia or gonorrhea. Ask your health care provider if you are at risk.  If you do not have HIV, but are at risk, it may be recommended that you take a prescription medicine daily to prevent HIV infection. This is called pre-exposure prophylaxis (PrEP). You are considered at risk if:  You are sexually active and do not regularly use condoms or know the HIV status of your partner(s).  You take drugs by injection.  You are sexually active with a partner who has HIV. Talk with your health care provider about whether you are at high risk of being infected with HIV. If you choose to begin PrEP, you should first be tested for HIV. You should then be tested every 3 months for as long as you are taking PrEP. Pregnancy  If you are premenopausal and you may become pregnant, ask your health care provider about preconception counseling.  If you may become pregnant, take 400 to 800 micrograms (mcg) of folic acid every day.  If you want to prevent pregnancy, talk to your health care provider about birth control (contraception). Osteoporosis and menopause  Osteoporosis is a disease in which the bones lose minerals and strength with aging. This can result in serious bone fractures. Your risk for osteoporosis can be identified using a bone density scan.  If you are 24 years of age or older, or if you are at risk for osteoporosis and fractures, ask your health care provider if you should be screened.  Ask your health care provider whether you should take a calcium or vitamin D supplement to lower your risk for osteoporosis.  Menopause may have certain physical symptoms and risks.  Hormone replacement therapy may reduce some of these symptoms and risks. Talk to your health care provider about whether hormone replacement therapy is right for you. Follow these instructions at home:  Schedule regular health, dental, and eye  exams.  Stay current with your immunizations.  Do not use any tobacco products including cigarettes, chewing tobacco, or electronic cigarettes.  If you are pregnant, do not drink alcohol.  If you are breastfeeding, limit how much and how often you drink alcohol.  Limit alcohol intake to no more than 1 drink per day for nonpregnant women. One drink equals 12 ounces of beer, 5 ounces of wine, or 1 ounces of hard liquor.  Do not use street drugs.  Do not share needles.  Ask your health care provider for help if you need support or information about quitting drugs.  Tell your health care provider if you often feel depressed.  Tell your health care provider if you have ever been abused or do not feel safe at home. This information is not intended to replace advice given to you by your health care provider. Make sure you discuss any questions you have with your health care provider. Document Released: 05/09/2011 Document Revised: 03/31/2016 Document Reviewed: 07/28/2015  2017 Elsevier

## 2017-01-18 ENCOUNTER — Encounter: Payer: Self-pay | Admitting: Physician Assistant

## 2017-01-18 ENCOUNTER — Ambulatory Visit (INDEPENDENT_AMBULATORY_CARE_PROVIDER_SITE_OTHER): Payer: Self-pay | Admitting: Physician Assistant

## 2017-01-18 VITALS — BP 131/80 | HR 101 | Temp 98.6°F | Resp 18 | Ht 61.0 in | Wt 157.8 lb

## 2017-01-18 DIAGNOSIS — E119 Type 2 diabetes mellitus without complications: Secondary | ICD-10-CM

## 2017-01-18 DIAGNOSIS — I10 Essential (primary) hypertension: Secondary | ICD-10-CM

## 2017-01-18 LAB — POCT GLYCOSYLATED HEMOGLOBIN (HGB A1C): Hemoglobin A1C: 6.6

## 2017-01-18 MED ORDER — LISINOPRIL 2.5 MG PO TABS: 2.5000 mg | ORAL_TABLET | Freq: Every day | ORAL | 5 refills | Status: DC

## 2017-01-18 MED ORDER — METOPROLOL SUCCINATE ER 50 MG PO TB24: 50.0000 mg | ORAL_TABLET | Freq: Every evening | ORAL | 5 refills | Status: DC

## 2017-01-18 MED ORDER — METFORMIN HCL 850 MG PO TABS: ORAL_TABLET | ORAL | 5 refills | Status: DC

## 2017-01-18 MED ORDER — GLUCOSE BLOOD VI STRP: ORAL_STRIP | 3 refills | Status: AC

## 2017-01-18 MED ORDER — ATORVASTATIN CALCIUM 10 MG PO TABS: 10.0000 mg | ORAL_TABLET | Freq: Every day | ORAL | 5 refills | Status: DC

## 2017-01-18 NOTE — Progress Notes (Signed)
MRN: [id number]  Subjective:    Elizabeth [name] is a 46 y.o. female who presents for follow up of Type 2 diabetes mellitus and HTN.   Diabetes: Was diagnosed in 2006, started medications in 2010. Was initially started on metformin 500 BID but then changed to 850 daily. She was increased in 2016 to '850mg'$  BID. Her last check up for diabetes was with me in 10/2016.  She has not been checking blood sugars daily because she is out of test strips. She has no side effects from the medication. Pt has occasional dry mouth. Denies foot ulcerations, paraesthesias, abdominal pain, polydipsia, polyphagia, and visual problems. She checks her feet regularly. Her last eye exam was 08/2016 and it was normal. Notes her A1C may be a little elevated because she has eaten more sweets over the holidays and has not been able to exercise as much due to the weather. She really enjoys walking and is ready for the warm weather to get here.   HTN: Was diagnosed in 2006, started medications in 2010. Controlled on metoprolol succinate '50mg'$  and lisinopril 2.'5mg'$  daily. Checks her bp outside of the office occasionally. Typically runs 120s-130s.  Has no side effects from the medication. Denies SOB, lightheadedness, dizziness, chest pain, heart palpitations, nausea, vomiting, lower leg swelling, hematuria, and lower leg swelling.     Objective:   PHYSICAL EXAM BP 131/80 (BP Location: Right Arm, Patient Position: Sitting, Cuff Size: Small)   Pulse (!) 101   Temp 98.6 F (37 C) (Oral)   Resp 18   Ht '5\' 1"'$  (1.549 m)   Wt 157 lb 12.8 oz (71.6 kg)   LMP 12/12/2016   SpO2 96%   BMI 29.82 kg/m   Physical Exam  Constitutional: She is oriented to person, place, and time. She appears well-developed and well-nourished.  HENT:  Head: Normocephalic and atraumatic.  Eyes: Conjunctivae are normal.  Neck: Normal range of motion.  Cardiovascular: Regular rhythm, normal heart sounds and intact distal pulses.  Tachycardia present.     Pulmonary/Chest: Effort normal.  Musculoskeletal:       Right lower leg: She exhibits no swelling.       Left lower leg: She exhibits no swelling.  Neurological: She is alert and oriented to person, place, and time.  Skin: Skin is warm and dry.  Psychiatric: She has a normal mood and affect.  Vitals reviewed.   Results for orders placed or performed in visit on 01/18/17 (from the past 24 hour(s))  POCT glycosylated hemoglobin (Hb A1C)     Status: None   Collection Time: 01/18/17  4:03 PM  Result Value Ref Range   Hemoglobin A1C 6.6     Assessment and Plan :  1. Controlled type 2 diabetes mellitus without complication, without long-term current use of insulin ([place]) Well controlled. Despite slight elevation in A1C from previous visit, will continue current medication regimen. Encouraged to start walking outside as the weather warms up. Follow up in 6 months for reevaluation of diabetes.  - CMP14+EGFR - POCT glycosylated hemoglobin (Hb A1C) - metFORMIN (GLUCOPHAGE) 850 MG tablet; Take 1 tablet twice daily with meals.  Dispense: 60 tablet; Refill: 5 - lisinopril (ZESTRIL) 2.5 MG tablet; Take 1 tablet (2.5 mg total) by mouth daily.  Dispense: 30 tablet; Refill: 5 - atorvastatin (LIPITOR) 10 MG tablet; Take 1 tablet (10 mg total) by mouth daily.  Dispense: 30 tablet; Refill: 5 - glucose blood test strip; Use as instructed  Dispense: 100 each; Refill: 3  2. Essential hypertension Well controlled.  - metoprolol succinate (TOPROL-XL) 50 MG 24 hr tablet; Take 1 tablet (50 mg total) by mouth every evening.  Dispense: 30 tablet; Refill: 5 - lisinopril (ZESTRIL) 2.5 MG tablet; Take 1 tablet (2.5 mg total) by mouth daily.  Dispense: 30 tablet; Refill: 5

## 2017-01-18 NOTE — Patient Instructions (Addendum)
Keep up the good work! You are doing so great with your health. Enjoy the warm weather when it comes around. Please let me know if you need anything! Otherwise, I will see you in clinic in about 6 months.   It is always a pleasure seeing you, have a great day! We will contact you with the rest of your lab results from today once they are returned to us.    IF you received an x-ray today, you will receive an invoice [facility]. Please contact [facility] at [phone] with questions or concerns regarding your invoice.   IF you received labwork today, you will receive an invoice from [facility]. Please contact [facility] at [phone] with questions or concerns regarding your invoice.   Our billing staff will not be able to assist you with questions regarding bills from these companies.  You will be contacted with the lab results as soon as they are available. The fastest way to get your results is to activate your My Chart account. Instructions are located on the last page of this paperwork. If you have not heard from us regarding the results in 2 weeks, please contact this office.

## 2017-01-19 LAB — CMP14+EGFR
ALBUMIN: 4.5 g/dL (ref 3.5–5.5)
ALT: 40 IU/L — ABNORMAL HIGH (ref 0–32)
AST: 28 IU/L (ref 0–40)
Albumin/Globulin Ratio: 1.8 (ref 1.2–2.2)
Alkaline Phosphatase: 81 IU/L (ref 39–117)
BILIRUBIN TOTAL: 0.2 mg/dL (ref 0.0–1.2)
BUN / CREAT RATIO: 14 (ref 9–23)
BUN: 7 mg/dL (ref 6–24)
CHLORIDE: 98 mmol/L (ref 96–106)
CO2: 21 mmol/L (ref 18–29)
CREATININE: 0.5 mg/dL — AB (ref 0.57–1.00)
Calcium: 8.9 mg/dL (ref 8.7–10.2)
GFR calc non Af Amer: 116 mL/min/{1.73_m2} (ref >59)
GFR, EST AFRICAN AMERICAN: 133 mL/min/{1.73_m2} (ref >59)
GLUCOSE: 349 mg/dL — AB (ref 65–99)
Globulin, Total: 2.5 g/dL (ref 1.5–4.5)
Potassium: 3.8 mmol/L (ref 3.5–5.2)
Sodium: 136 mmol/L (ref 134–144)
TOTAL PROTEIN: 7 g/dL (ref 6.0–8.5)

## 2017-01-24 ENCOUNTER — Telehealth: Payer: Self-pay | Admitting: Emergency Medicine

## 2017-01-24 NOTE — Telephone Encounter (Signed)
-----   Message from Magdalene RiverBrittany D Wiseman, PA-C sent at 01/24/2017  2:41 PM EDT ----- Please call pt and let her know that her results show clinically normal electrolytes and kidney function. Her blood sugar was elevated at 349, which could because she had eaten prior to her lab work the day of her office visit. Her ALT also continues to be elevated. We had discussed on the phone last time that she had a work up when she was in high school for elevated liver enzymes, which ended up being negative. Will you ask her if she wants us to do another work up since it has been sometime since high school? We could start out by just running a hepatitis panel and go from there? We could do this at her next visit, please let me know if she would like this done. Also if she could find her medical charts from when she had this work up in high school, that would be awesome! Please let me know if she has any questions, thanks!

## 2017-02-01 ENCOUNTER — Other Ambulatory Visit: Payer: Self-pay

## 2017-02-01 ENCOUNTER — Other Ambulatory Visit: Payer: Self-pay | Admitting: Physician Assistant

## 2017-02-01 DIAGNOSIS — R7989 Other specified abnormal findings of blood chemistry: Secondary | ICD-10-CM

## 2017-02-01 DIAGNOSIS — R945 Abnormal results of liver function studies: Principal | ICD-10-CM

## 2017-02-01 NOTE — Progress Notes (Signed)
No orders of the defined types were placed in this encounter.

## 2017-02-02 LAB — HEPATITIS PANEL, ACUTE
HEP B C IGM: NEGATIVE
HEP B S AG: NEGATIVE
Hep A IgM: NEGATIVE
Hep C Virus Ab: <0.1 s/co ratio (ref 0.0–0.9)

## 2017-02-03 ENCOUNTER — Other Ambulatory Visit: Payer: Self-pay | Admitting: Physician Assistant

## 2017-02-03 DIAGNOSIS — R945 Abnormal results of liver function studies: Principal | ICD-10-CM

## 2017-02-03 DIAGNOSIS — R7989 Other specified abnormal findings of blood chemistry: Secondary | ICD-10-CM

## 2017-02-03 NOTE — Progress Notes (Signed)
No orders of the defined types were placed in this encounter.

## 2017-02-18 ENCOUNTER — Other Ambulatory Visit: Payer: Self-pay | Admitting: Physician Assistant

## 2017-02-18 DIAGNOSIS — I10 Essential (primary) hypertension: Secondary | ICD-10-CM

## 2017-03-06 ENCOUNTER — Telehealth: Payer: Self-pay | Admitting: Physician Assistant

## 2017-03-06 NOTE — Telephone Encounter (Signed)
Pt was referred to have U/S but has not scheduled yet due to some upcoming appointments for her mother. Pt was wondering if she could wait until mid-June to have U/S or if she needs to do it before then. Please advise. Best pt callback number is [phone].

## 2017-03-08 NOTE — Telephone Encounter (Signed)
Please call patient and inform her that it is okay to wait to have the U/S until mid June. I hope everything goes well with her mother's appointments. Please let me know if she has any questions. Thanks.

## 2017-03-09 NOTE — Telephone Encounter (Signed)
Pt advised.

## 2017-03-22 ENCOUNTER — Encounter: Payer: Self-pay | Admitting: Gynecology

## 2017-05-12 IMAGING — CR DG CHEST 2V
2 series · 2 of 2 positions shown · non-contrast
Comparison: 03/16/2015

CLINICAL DATA: Per patient she has been having a tingling feeling
in the back of her neck, weak, she feels like her heart is beating
faster than normal X 1 day. HX tachycardia, panic attacks, HTN

EXAM:
CHEST  2 VIEW

[chest pa]
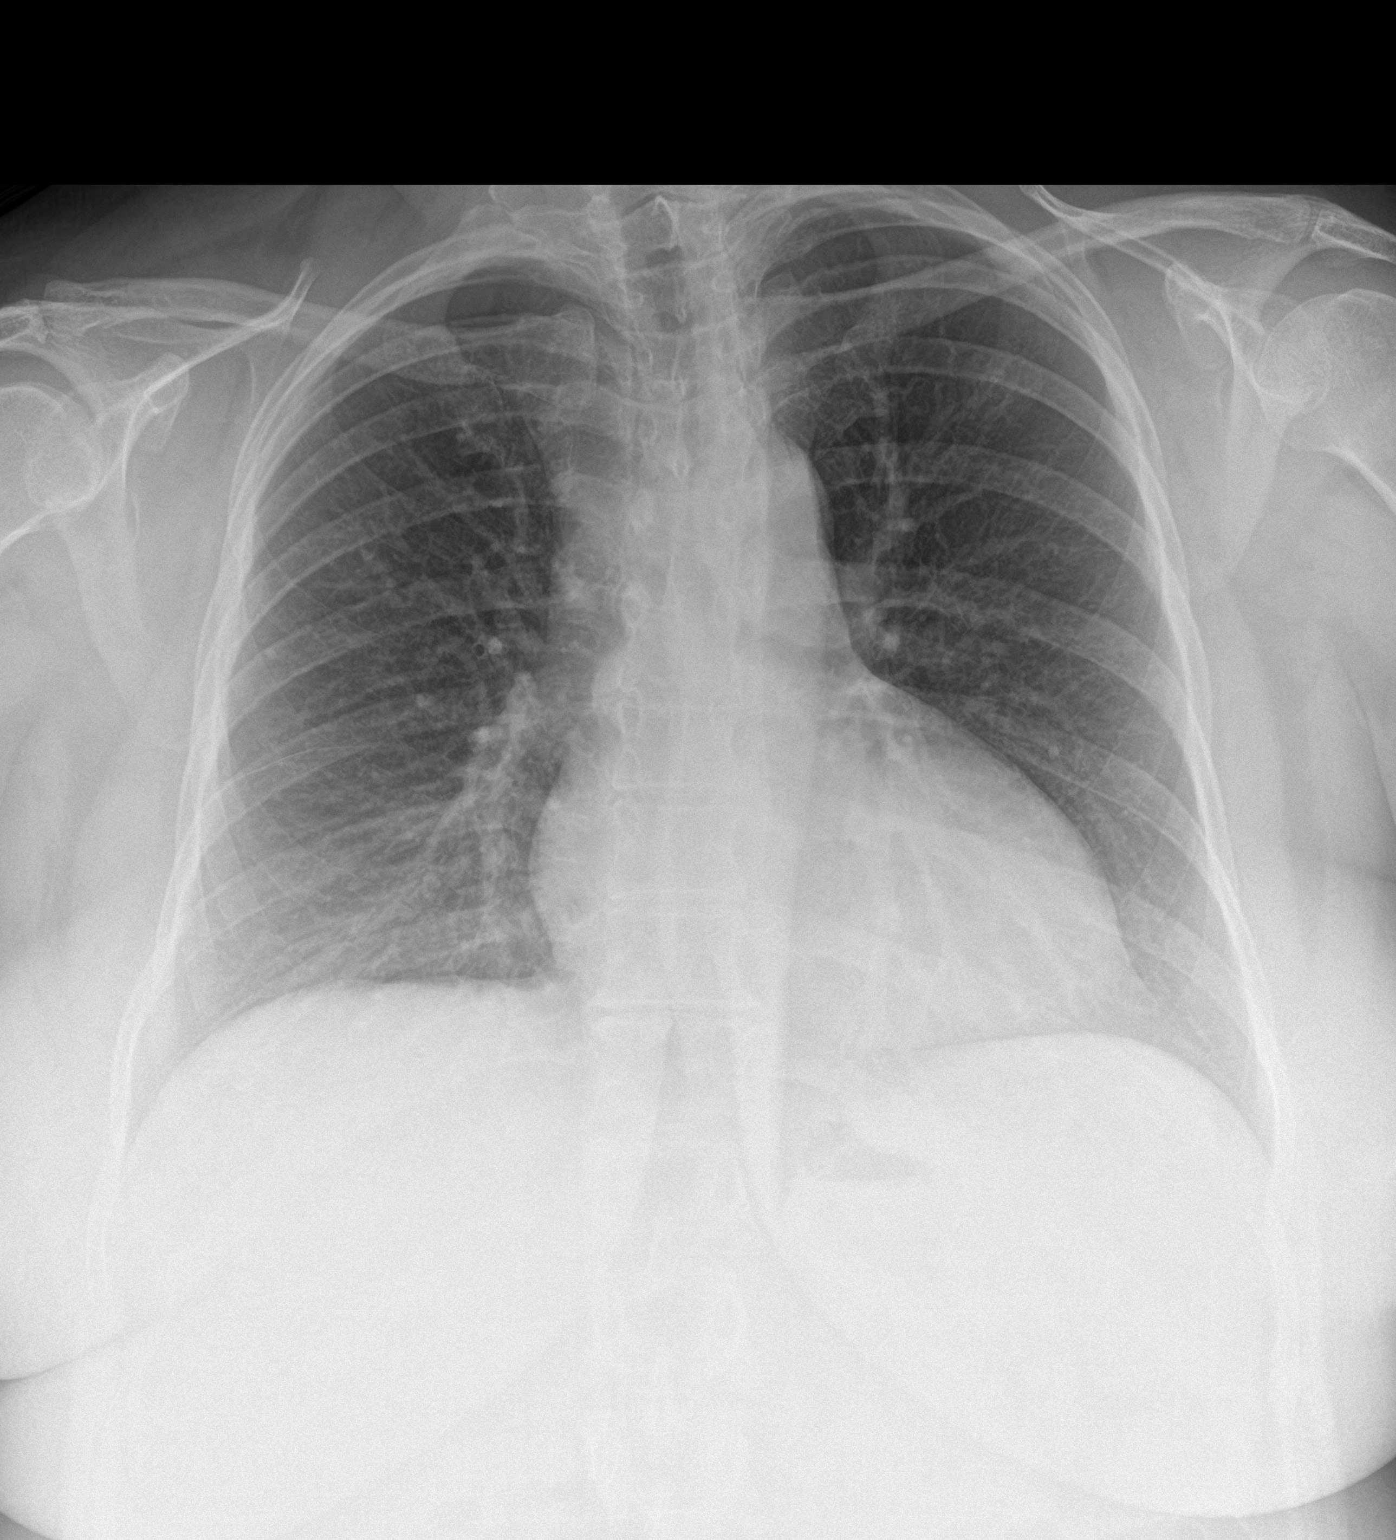

[chest lat]
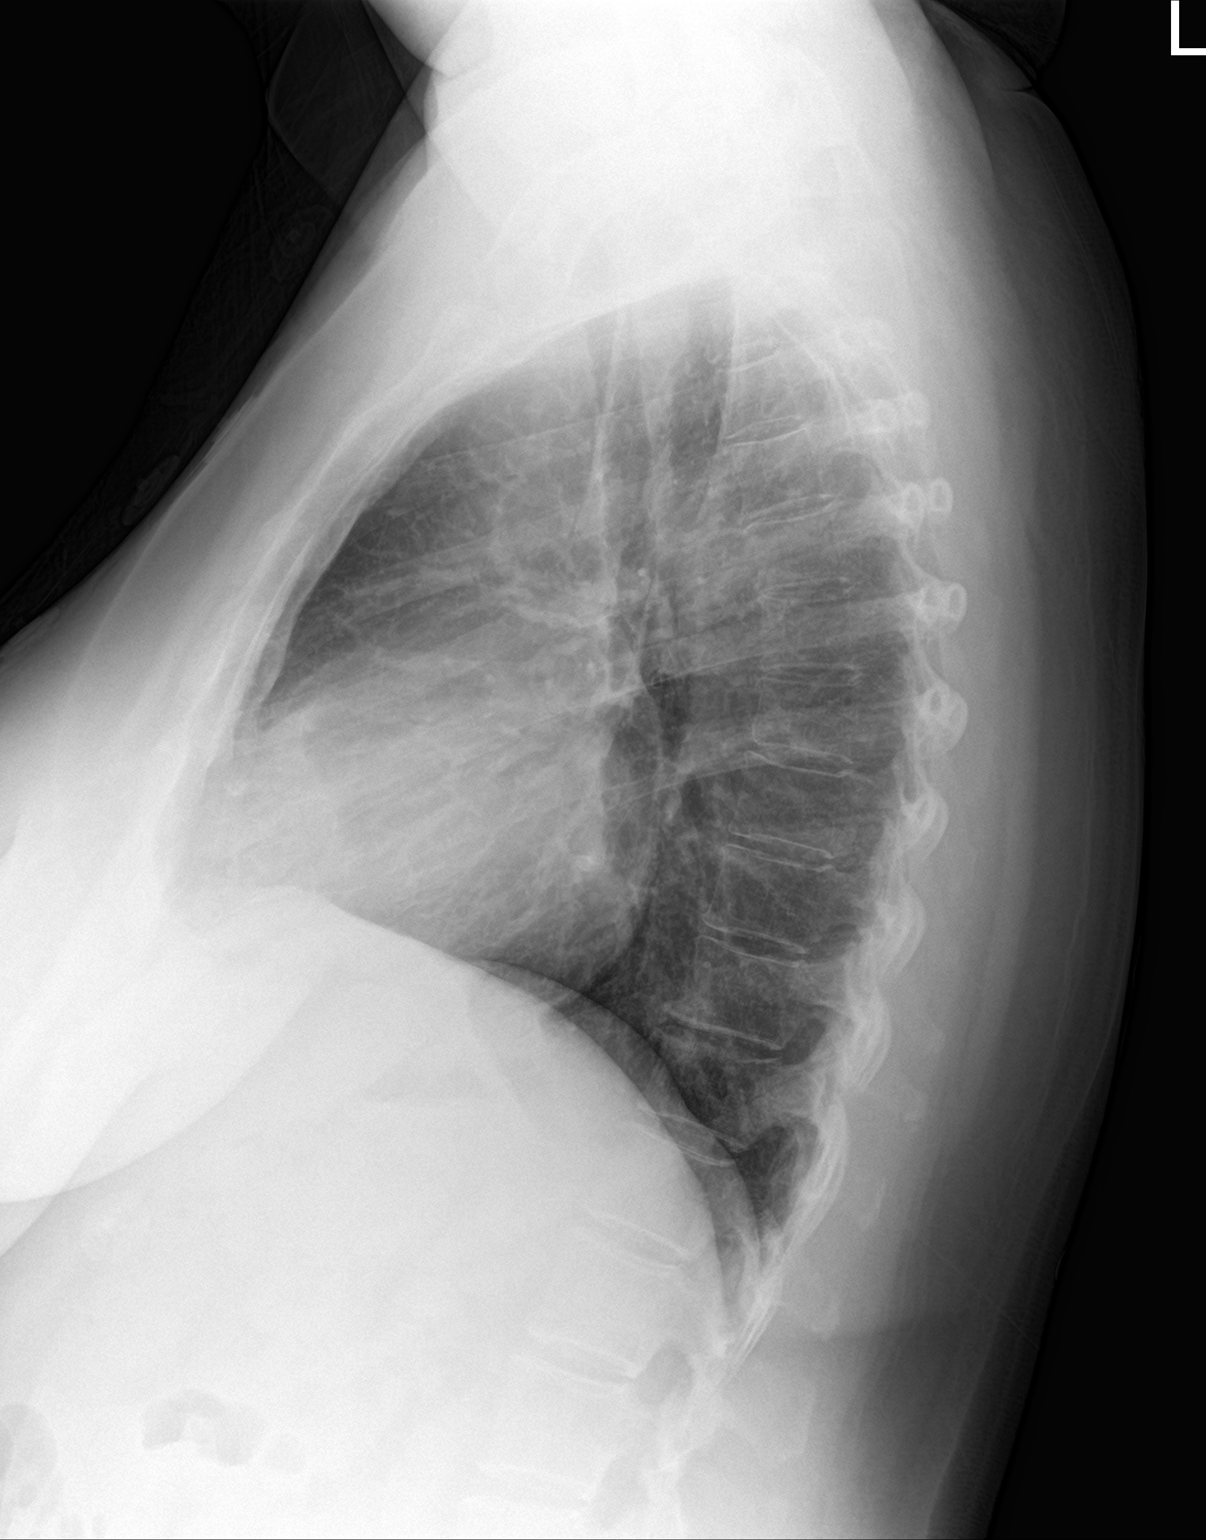

[2 of 2 positions shown; findings below may reference images not displayed]

FINDINGS: Cardiac silhouette top-normal size. Normal mediastinal and hilar
contours.

Clear lungs.  No pleural effusion or pneumothorax.

Skeletal structures are unremarkable.
IMPRESSION: No active cardiopulmonary disease.

## 2017-05-17 ENCOUNTER — Encounter: Payer: Self-pay | Admitting: Physician Assistant

## 2017-05-17 ENCOUNTER — Ambulatory Visit (INDEPENDENT_AMBULATORY_CARE_PROVIDER_SITE_OTHER): Payer: Self-pay | Admitting: Physician Assistant

## 2017-05-17 VITALS — BP 144/90 | HR 100 | Temp 98.2°F | Resp 16 | Ht 61.0 in | Wt 175.2 lb

## 2017-05-17 DIAGNOSIS — R21 Rash and other nonspecific skin eruption: Secondary | ICD-10-CM

## 2017-05-17 DIAGNOSIS — R739 Hyperglycemia, unspecified: Secondary | ICD-10-CM

## 2017-05-17 LAB — GLUCOSE, POCT (MANUAL RESULT ENTRY): POC Glucose: 238 mg/dl — AB (ref 70–99)

## 2017-05-17 MED ORDER — FLUCONAZOLE 100 MG PO TABS: 100.0000 mg | ORAL_TABLET | ORAL | 0 refills | Status: DC

## 2017-05-17 MED ORDER — NYSTATIN 100000 UNIT/GM EX POWD: Freq: Three times a day (TID) | CUTANEOUS | 0 refills | Status: DC

## 2017-05-17 MED ORDER — NYSTATIN 100000 UNIT/GM EX POWD
Freq: Three times a day (TID) | CUTANEOUS | 0 refills | Status: DC
Start: 2017-05-17 — End: 2017-05-27

## 2017-05-17 NOTE — Progress Notes (Signed)
BIBIANA GILLEAN  MRN: [id number] DOB: [birth year]  Subjective:   Elizabeth [name] is a 46 y.o. female who presents for evaluation of a rash involving the groin area. Rash started 1 weeks ago. Lesions are red, and flat in texture. Rash has changed over time. Rash is painful and is pruritic. Associated symptoms: none. Patient denies: abdominal pain, arthralgia, congestion, cough, crankiness, decrease in appetite, decrease in energy level, fever, headache, irritability, myalgia, nausea, sore throat and vomiting. Patient has not had contacts with similar rash. Patient has not had new exposures (soaps, lotions, laundry detergents, foods, medications, plants, insects or animals). Rash appeared after she did not have AC in her house or car over the past couple weeks. Rash is irritated when sweating.   Review of Systems  Cardiovascular: Negative for chest pain, palpitations and leg swelling.      Patient Active Problem List   Diagnosis Date Noted  . Tachycardia 08/23/2016  . Environmental allergies 07/25/2014  . Headache, common migraine (occasionally w/ aura) 04/25/2013  . HTN (hypertension) 09/26/2012  . Overweight (BMI 25.0-29.9) 09/26/2012  . Metabolic syndrome 09/26/2012  . Bipolar depression (HCC)     Current Outpatient Prescriptions on File Prior to Visit  Medication Sig Dispense Refill  . atorvastatin (LIPITOR) 10 MG tablet Take 1 tablet (10 mg total) by mouth daily. 30 tablet 5  . B Complex-C (B-COMPLEX WITH VITAMIN C) tablet Take 1 tablet by mouth daily.    . fluticasone (FLONASE) 50 MCG/ACT nasal spray Place 2 sprays into both nostrils daily. 16 g 12  . Ginkgo Biloba (GINKOBA PO) Take 1 tablet by mouth 2 (two) times daily.    Marland Kitchen ibuprofen (ADVIL,MOTRIN) 200 MG tablet Take 200 mg by mouth every 6 (six) hours as needed.    Marland Kitchen lisinopril (ZESTRIL) 2.5 MG tablet Take 1 tablet (2.5 mg total) by mouth daily. 30 tablet 5  . loratadine (CLARITIN) 10 MG tablet Take 1 tablet (10 mg total) by  mouth daily. 14 tablet 0  . metFORMIN (GLUCOPHAGE) 850 MG tablet Take 1 tablet twice daily with meals. 60 tablet 5  . metoprolol succinate (TOPROL-XL) 50 MG 24 hr tablet Take 1 tablet (50 mg total) by mouth every evening. 30 tablet 5  . Multiple Vitamin (MULTIVITAMIN WITH MINERALS) TABS tablet Take 1 tablet by mouth daily.    . Omega-3 Fatty Acids (FISH OIL) 1000 MG CPDR Take by mouth.    . paliperidone (INVEGA) 6 MG 24 hr tablet Take 12 mg by mouth at bedtime.    . sertraline (ZOLOFT) 100 MG tablet Take 150 mg by mouth daily.     . vitamin E 400 UNIT capsule Take 400 Units by mouth daily.    . benzonatate (TESSALON) 100 MG capsule Take 1-2 capsules (100-200 mg total) by mouth 3 (three) times daily as needed for cough. (Patient not taking: Reported on 01/18/2017) 40 capsule 0  . chlorpheniramine-HYDROcodone (TUSSIONEX PENNKINETIC ER) 10-8 MG/5ML SUER Take 5 mLs by mouth every 12 (twelve) hours as needed for cough. (Patient not taking: Reported on 05/17/2017) 100 mL 0  . doxycycline (VIBRAMYCIN) 100 MG capsule Take 1 capsule (100 mg total) by mouth 2 (two) times daily. (Patient not taking: Reported on 01/18/2017) 20 capsule 0  . glucose blood test strip Use as instructed (Patient not taking: Reported on 05/17/2017) 100 each 3  . Guaifenesin (MUCINEX MAXIMUM STRENGTH) 1200 MG TB12 Take 1 tablet (1,200 mg total) by mouth every 12 (twelve) hours as needed. (Patient not taking:  Reported on 01/18/2017) 14 tablet 1   No current facility-administered medications on file prior to visit.     Allergies  Allergen Reactions  . Tomato     Throat break out   . Penicillins Other (See Comments)    Childhood reaction Has patient had a PCN reaction causing immediate rash, facial/tongue/throat swelling, SOB or lightheadedness with hypotension: YES Has patient had a PCN reaction causing severe rash involving mucus membranes or skin necrosis: NO Has patient had a PCN reaction that required hospitalizationNO Has  patient had a PCN reaction occurring within the last 10 years: NO If all of the above answers are "NO", then may proceed with Cephalosporin use.     Objective:  BP (!) 144/90 (BP Location: Left Arm, Patient Position: Sitting, Cuff Size: Normal)   Pulse 100   Temp 98.2 F (36.8 C) (Oral)   Resp 16   Ht 5\' 1"  (1.549 m)   Wt 175 lb 3.2 oz (79.5 kg)   SpO2 96%   BMI 33.10 kg/m   Physical Exam  Constitutional: She is oriented to person, place, and time and well-developed, well-nourished, and in no distress.  HENT:  Head: Normocephalic and atraumatic.  Eyes: Conjunctivae are normal.  Neck: Normal range of motion.  Cardiovascular: Normal rate, regular rhythm and normal heart sounds.   Pulmonary/Chest: Effort normal and breath sounds normal.  Neurological: She is alert and oriented to person, place, and time. Gait normal.  Skin: Skin is warm and dry. Rash noted.  Diffuse moist erythematour macular rash with slight maceration of inguinal folds bilaterally, satellite lesions noted.    Psychiatric: Affect normal.  Vitals reviewed.  Results for orders placed or performed in visit on 05/17/17 (from the past 24 hour(s))  POCT glucose (manual entry)     Status: Abnormal   Collection Time: 05/17/17  6:22 PM  Result Value Ref Range   POC Glucose 238 (A) 70 - 99 mg/dl    Assessment and Plan :   1. Rash and nonspecific skin eruption Consistent with intertrigo candidiasis. Given educational material on intertrigo. Instructed to return to clinic if symptoms worsen, do not improve, or as needed - POCT glucose (manual entry) Meds ordered this encounter  Medications  . DISCONTD: nystatin (MYCOSTATIN/NYSTOP) powder    Sig: Apply topically 3 (three) times daily.    Dispense:  15 g    Refill:  0    Order Specific Question:   Supervising Provider    Answer:   Ethelda Chick [[id number]]  . DISCONTD: fluconazole (DIFLUCAN) 100 MG tablet    Sig: Take 1 tablet (100 mg total) by mouth once a week.     Dispense:  4 tablet    Refill:  0    Order Specific Question:   Supervising Provider    Answer:   Ethelda Chick [[id number]]  . fluconazole (DIFLUCAN) 100 MG tablet    Sig: Take 1 tablet (100 mg total) by mouth once a week.    Dispense:  4 tablet    Refill:  0    Order Specific Question:   Supervising Provider    Answer:   Ethelda Chick [[id number]]  . nystatin (MYCOSTATIN/NYSTOP) powder    Sig: Apply topically 3 (three) times daily.    Dispense:  15 g    Refill:  0    Order Specific Question:   Supervising Provider    Answer:   Ethelda Chick [[id number]]    2. Blood glucose elevated Pt encouraged  to gain better control on glucose for faster healing of intertrigo candidiasis. Check blood glucose regularly. FBS goal is 70-100. Follow up in 2 months for diabetes reevaluation.   Benjiman CoreBrittany Rayla Pember, PA-C  Primary Care at [facility] 05/17/2017 6:59 PM

## 2017-05-17 NOTE — Progress Notes (Deleted)
Subjective:     Elizabeth [name] is a 46 y.o. female who presents for evaluation of a rash involving the {body part:32401}. Rash started {1-10:13787} {time; units:19136} ago. Lesions are {color:5006}, and {text:16500} in texture. Rash {has/not:18111} changed over time. Rash {rash dc:16501}. Associated symptoms: {rash assoc:16502}. Patient denies: {rash assoc:19565}. Patient {has/not:18111} had contacts with similar rash. Patient {has/not:18111} had new exposures (soaps, lotions, laundry detergents, foods, medications, plants, insects or animals).  {Common ambulatory SmartLinks:19316}  Review of Systems {ros; complete:30496}    Objective:    BP (!) 161/89   Pulse 100   Temp 98.2 F (36.8 C) (Oral)   Resp 16   Ht 5\' 1"  (1.549 m)   Wt 175 lb 3.2 oz (79.5 kg)   SpO2 96%   BMI 33.10 kg/m  General:  {gen appearance:16600}  Skin:  {skin exam:30902::"normal"}     Assessment:    {derm diagnosis:16511}    Plan:    {NWGN:56213}{plan:18774}

## 2017-05-17 NOTE — Patient Instructions (Addendum)
Try to keep the area as dry as possible. While you are at home, just wear a long gown with no underwear to air the area out. I have given you a drying powder you can use up to three times a day. I have also given you an oral tablet that you can take weekly for the next four weeks. If it improves before you run out of the medication, you do not have to take the additional tablets. Please return to office if symptoms worsen or do not improve with current treatment.   In terms of elevated blood pressure, I would like you to check your blood pressure at least a couple times over the next week outside of the office and document these values. It is best if you check the blood pressure at different times in the day. Your goal is <140/90. If your values are consistently above this goal, please return to office for further evaluation. If you start to have chest pain, blurred vision, shortness of breath, severe headache, lower leg swelling, or nausea/vomiting please seek care immediately here or at the ED.      Intertrigo Intertrigo is skin irritation (inflammation) that happens in warm, moist areas of the body. The irritation can cause a rash and make skin raw and itchy. The rash is usually pink or red. It happens mostly between folds of skin or where skin rubs together, such as:  Toes.  Armpits.  Groin.  Belly.  Breasts.  Buttocks.  This condition is not passed from person to person (is not contagious). Follow these instructions at home:  Keep the affected area clean and dry.  Do not scratch your skin.  Stay cool as much as possible. Use an air conditioner or fan, if you can.  Apply over-the-counter and prescription medicines only as told by your doctor.  If you were prescribed an antibiotic medicine, use it as told by your doctor. Do not stop using the antibiotic even if your condition starts to get better.  Keep all follow-up visits as told by your doctor. This is important. How is this  prevented?  Stay at a healthy weight.  Keep your feet dry. This is very important if you have diabetes. Wear cotton or wool socks.  Take care of and protect the skin in your groin and butt area as told by your doctor.  Do not wear tight clothes. Wear clothes that: ? Are loose. ? Take away moisture from your body. ? Are made of cotton.  Wear a bra that gives good support, if needed.  Shower and dry yourself fully after being active.  Keep your blood sugar under control if you have diabetes. Contact a doctor if:  Your symptoms do not get better with treatment.  Your symptoms get worse or they spread.  You notice more redness and warmth.  You have a fever. This information is not intended to replace advice given to you by your health care provider. Make sure you discuss any questions you have with your health care provider. Document Released: 11/26/2010 Document Revised: 03/31/2016 Document Reviewed: 04/27/2015 Elsevier Interactive Patient Education  2018 [employer]  IF you received an x-ray today, you will receive an invoice from [facility]. Please contact [facility] at [phone] with questions or concerns regarding your invoice.   IF you received labwork today, you will receive an invoice from [facility]. Please contact [facility] at [phone] with questions or concerns regarding your invoice.   Our billing staff will not be  able to assist you with questions regarding bills from these companies.  You will be contacted with the lab results as soon as they are available. The fastest way to get your results is to activate your My Chart account. Instructions are located on the last page of this paperwork. If you have not heard from [place] regarding the results in 2 weeks, please contact this office.

## 2017-05-27 ENCOUNTER — Ambulatory Visit (INDEPENDENT_AMBULATORY_CARE_PROVIDER_SITE_OTHER): Payer: Self-pay | Admitting: Physician Assistant

## 2017-05-27 ENCOUNTER — Encounter: Payer: Self-pay | Admitting: Physician Assistant

## 2017-05-27 ENCOUNTER — Other Ambulatory Visit: Payer: Self-pay

## 2017-05-27 VITALS — BP 158/86 | HR 100 | Temp 98.5°F | Resp 17 | Ht 62.5 in | Wt 175.0 lb

## 2017-05-27 DIAGNOSIS — R21 Rash and other nonspecific skin eruption: Secondary | ICD-10-CM

## 2017-05-27 MED ORDER — NYSTATIN 100000 UNIT/GM EX POWD: Freq: Three times a day (TID) | CUTANEOUS | 1 refills | Status: DC

## 2017-05-27 NOTE — Patient Instructions (Addendum)
For yeast infection, I have given you a larger tube of the powder with one additional refill. Please contact our office if you are still having symptoms in 2 weeks. Try to keep area as dry as possible. Thank you for letting me participate in your health and well being.   IF you received an x-ray today, you will receive an invoice from [facility]. Please contact [facility] at [phone] with questions or concerns regarding your invoice.   IF you received labwork today, you will receive an invoice from [facility]. Please contact [facility] at [phone] with questions or concerns regarding your invoice.   Our billing staff will not be able to assist you with questions regarding bills from these companies.  You will be contacted with the lab results as soon as they are available. The fastest way to get your results is to activate your My Chart account. Instructions are located on the last page of this paperwork. If you have not heard from us regarding the results in 2 weeks, please contact this office.

## 2017-05-27 NOTE — Progress Notes (Signed)
    MRN: [id number] DOB: Jul 20, 1971  Subjective:   Elizabeth [name] is a 46 y.o. female presenting for follow up on rash in groin area. Pt initially seen on 05/17/17 for rash. She was having a lot of pain and pruritis at that time. PE findings showed diffuse moist erythematour macular rash with slight maceration of inguinal folds bilaterally, satellite lesions noted. Pt given oral diflucan once weekly x 4 weeks and topical nystatin and instructed to keep area dry. Pt notes that she is feeling a whole lot better but she ran out of the nystatin and wanted a refill. She has been out for the past 4 days. She had to go back to work this week and notes it got irritated in the heat again without the topical powder. She has completed two doses of oral diflucan. Patient denies abdominal pain, arthralgia, congestion, cough, crankiness, decrease in appetite, decrease in energy level, fever, headache, irritability, myalgia, nausea, sore throat and vomiting.    Elizabeth [name] has a current medication list which includes the following prescription(s): atorvastatin, b-complex with vitamin c, fluconazole, fluticasone, ginkgo biloba, glucose blood, ibuprofen, lisinopril, loratadine, metformin, metoprolol succinate, multivitamin with minerals, nystatin, fish oil, paliperidone, sertraline, and vitamin e. Also is allergic to tomato and penicillins.  Elizabeth [name]  has a past medical history of Allergy; Anxiety; Bipolar depression (HCC); Diabetes mellitus without complication (HCC); and Hypertension. Also  has no past surgical history on file.   Objective:   Vitals: BP (!) 158/86   Pulse 100   Temp 98.5 F (36.9 C) (Oral)   Resp 17   Ht 5' 2.5" (1.588 m)   Wt 175 lb (79.4 kg)   LMP 05/09/2017 (Approximate)   SpO2 98%   BMI 31.50 kg/m   Physical Exam  Constitutional: She is oriented to person, place, and time. She appears well-developed and well-nourished.  HENT:  Head: Normocephalic and atraumatic.  Eyes: Conjunctivae are  normal.  Neck: Normal range of motion.  Pulmonary/Chest: Effort normal.  Neurological: She is alert and oriented to person, place, and time.  Skin: Skin is warm and dry. Rash noted.  Diffuse dry erythematous macular rash with slight maceration of inguinal folds bilaterally, satellite lesions noted. Improved since initial visit on 05/17/17.   Psychiatric: She has a normal mood and affect.  Vitals reviewed.   No results found for this or any previous visit (from the past 24 hour(s)).  Assessment and Plan :  1. Rash and nonspecific skin eruption Improving. Complete diflucan course. Refill given for topical nystatin powder. Return to/contact clinic if symptoms worsen, do not improve in 2 weeks, or as needed   Benjiman CoreBrittany Wiseman, PA-C  Primary Care at [facility] 05/27/2017 2:30 PM

## 2017-06-13 ENCOUNTER — Other Ambulatory Visit: Payer: Self-pay | Admitting: Physician Assistant

## 2017-07-19 ENCOUNTER — Ambulatory Visit: Payer: Self-pay | Admitting: Physician Assistant

## 2017-08-02 ENCOUNTER — Ambulatory Visit: Payer: Self-pay | Admitting: Physician Assistant

## 2017-08-05 ENCOUNTER — Ambulatory Visit: Payer: Self-pay | Admitting: Family Medicine

## 2017-08-07 ENCOUNTER — Ambulatory Visit: Payer: Self-pay | Admitting: Physician Assistant

## 2017-08-11 ENCOUNTER — Telehealth: Payer: Self-pay | Admitting: Family Medicine

## 2017-08-11 ENCOUNTER — Telehealth: Payer: Self-pay | Admitting: *Deleted

## 2017-08-11 NOTE — Telephone Encounter (Signed)
-----   Message from Magdalene River, PA-C sent at 07/31/2017 12:53 PM EDT ----- Hello Dr. Gwendolyn Grant,   As discussed previously, for financial reasons, I am hoping that this patient can be seen by the [facility]. She has a follow up appointment with me on 08/02/17. Please let me know if this is a possibility. If so, I can contact the patient and let her know. Thank you!

## 2017-08-11 NOTE — Telephone Encounter (Signed)
Patient referred to our practice here at [facility] due to multiple medical comorbidities and difficult financial situation/lack of insurance.   I called to discuss with her about coming to our clinic.  Had to leave a message and instructed her to call.  I am okay accepting her as a patient. When she returns call, please send her the new patient packet and set up an appt to see me.  Thank you! JW

## 2017-08-11 NOTE — Telephone Encounter (Signed)
Patient returned call.   She is interested.  Will forward to [place]. [name] Preslar, Maryjo Rochester, CMA

## 2017-08-21 NOTE — Telephone Encounter (Signed)
Opened in error. Fleeger, Jessica Dawn, CMA  

## 2017-08-22 ENCOUNTER — Other Ambulatory Visit: Payer: Self-pay | Admitting: Physician Assistant

## 2017-08-22 DIAGNOSIS — I10 Essential (primary) hypertension: Secondary | ICD-10-CM

## 2017-08-22 NOTE — Telephone Encounter (Signed)
Pt. Called 08/22/17 to inquire about a RX refill for her metoprolol. She would like [facility] into [facility] [place] [facility] [address] -   Please advise

## 2017-08-25 NOTE — Telephone Encounter (Signed)
Pt has canceled X1 ( 07/19/17) and no show X3 ( 09/26, 09/29,and 08/07/2017) please advise/ refill for Metoprol

## 2017-08-28 MED ORDER — METOPROLOL SUCCINATE ER 50 MG PO TB24: 50.0000 mg | ORAL_TABLET | Freq: Every evening | ORAL | 1 refills | Status: DC

## 2017-08-28 NOTE — Telephone Encounter (Signed)
You can refill Metoprolol as she is gong to be seen by [facility] for further care but I have agreed to refill her medication until she has her first appointment with them.

## 2017-08-28 NOTE — Telephone Encounter (Signed)
Metoprolol refilled . Pt states that she do not have an appt yet with [facility] for further due to her needing paperwork from them filled out by her first. Pt states that she have the paperwork and will hand deliver it and schedule with them.

## 2017-09-23 ENCOUNTER — Other Ambulatory Visit: Payer: Self-pay | Admitting: Physician Assistant

## 2017-09-23 DIAGNOSIS — I10 Essential (primary) hypertension: Secondary | ICD-10-CM

## 2017-09-25 NOTE — Telephone Encounter (Signed)
Has the patient seen Family Medicine yet?    When is her visit?   Per Barnett AbuWiseman not we can refill medication until [facility] is [facility] then they will take over refills.

## 2017-09-25 NOTE — Telephone Encounter (Signed)
Does pt need an office visit before med can be refilled?

## 2017-09-26 NOTE — Telephone Encounter (Signed)
Pt calling in today. She is out of metformin and lipitor. When pt went to the office to drop off her paperwork and make an appt. They informed her they do not take pts who do not have insurance so she is in the process of finding a doc office that she can go to. [facility] [address] in [place].

## 2017-09-26 NOTE — Telephone Encounter (Signed)
Please give pt a 30 day refill of both of these medications and let her know if she cannot find anyone within one month to return to our clinic for lab work before she runs out of medication. I will also contact the [facility] and see if I can get any additional information. Thank you.

## 2017-09-26 NOTE — Telephone Encounter (Signed)
Please advise 

## 2017-09-27 ENCOUNTER — Other Ambulatory Visit: Payer: Self-pay

## 2017-09-27 ENCOUNTER — Telehealth: Payer: Self-pay | Admitting: Physician Assistant

## 2017-09-27 DIAGNOSIS — E119 Type 2 diabetes mellitus without complications: Secondary | ICD-10-CM

## 2017-09-27 MED ORDER — METFORMIN HCL 850 MG PO TABS: ORAL_TABLET | ORAL | 0 refills | Status: DC

## 2017-09-27 MED ORDER — ATORVASTATIN CALCIUM 10 MG PO TABS: 10.0000 mg | ORAL_TABLET | Freq: Every day | ORAL | 0 refills | Status: DC

## 2017-09-27 NOTE — Telephone Encounter (Signed)
Duplicate of telephone encounter dated 09/27/17 at 1118

## 2017-09-27 NOTE — Telephone Encounter (Signed)
Sent Rx refill x 30 day and advised to call before her medication runs out to let us know if she has found a provider.

## 2017-09-27 NOTE — Telephone Encounter (Signed)
Attempted to reach pt regarding prescription request; left message on voicemail at [phone] to call Dr Amador CunasKwiatkowski office

## 2017-10-23 ENCOUNTER — Encounter: Payer: Self-pay | Admitting: Physician Assistant

## 2017-10-23 ENCOUNTER — Ambulatory Visit: Payer: Self-pay | Admitting: Physician Assistant

## 2017-10-23 ENCOUNTER — Other Ambulatory Visit: Payer: Self-pay

## 2017-10-23 VITALS — BP 154/92 | HR 94 | Temp 99.2°F | Resp 18 | Ht 62.6 in | Wt 169.4 lb

## 2017-10-23 DIAGNOSIS — I10 Essential (primary) hypertension: Secondary | ICD-10-CM

## 2017-10-23 DIAGNOSIS — Z23 Encounter for immunization: Secondary | ICD-10-CM

## 2017-10-23 DIAGNOSIS — F3130 Bipolar disorder, current episode depressed, mild or moderate severity, unspecified: Secondary | ICD-10-CM

## 2017-10-23 DIAGNOSIS — E119 Type 2 diabetes mellitus without complications: Secondary | ICD-10-CM | POA: Insufficient documentation

## 2017-10-23 DIAGNOSIS — F319 Bipolar disorder, unspecified: Secondary | ICD-10-CM

## 2017-10-23 LAB — POCT URINALYSIS DIP (MANUAL ENTRY)
BILIRUBIN UA: NEGATIVE
BILIRUBIN UA: NEGATIVE mg/dL
Leukocytes, UA: NEGATIVE
Nitrite, UA: NEGATIVE
Protein Ur, POC: NEGATIVE mg/dL
RBC UA: NEGATIVE
SPEC GRAV UA: 1.015 (ref 1.010–1.025)
Urobilinogen, UA: 0.2 E.U./dL
pH, UA: 6 (ref 5.0–8.0)

## 2017-10-23 LAB — POCT GLYCOSYLATED HEMOGLOBIN (HGB A1C): HEMOGLOBIN A1C: 7.8

## 2017-10-23 MED ORDER — GLIMEPIRIDE 1 MG PO TABS: 1.0000 mg | ORAL_TABLET | Freq: Every day | ORAL | 0 refills | Status: DC

## 2017-10-23 MED ORDER — ATORVASTATIN CALCIUM 10 MG PO TABS: 10.0000 mg | ORAL_TABLET | Freq: Every day | ORAL | 1 refills | Status: DC

## 2017-10-23 MED ORDER — METOPROLOL SUCCINATE ER 50 MG PO TB24: 50.0000 mg | ORAL_TABLET | Freq: Every evening | ORAL | 1 refills | Status: DC

## 2017-10-23 MED ORDER — METFORMIN HCL 850 MG PO TABS: ORAL_TABLET | ORAL | 1 refills | Status: DC

## 2017-10-23 MED ORDER — LISINOPRIL 2.5 MG PO TABS: 2.5000 mg | ORAL_TABLET | Freq: Every day | ORAL | 1 refills | Status: DC

## 2017-10-23 NOTE — Progress Notes (Signed)
MRN: [id number]  Subjective:   Elizabeth [name] is a 46 y.o. female who presents for follow up of Type 2 diabetes mellitus.  Diagnosed in 2006.  Started on medications in 2010.  Currently managed with metformin 850 mg twice daily.  Last follow-up for diabetes was in 01/2017. Admits fair compliance. Denies adverse effects including metallic taste, hypoglycemia, nausea, vomiting.Patient is checking home blood sugars. Home blood sugar records: BGs range between 115 and 160. Has been out of metformin for about a week and checked sugar the other day and it was 400. Current symptoms include increase appetite since she has been out of metformin. Patient denies foot ulcerations, nausea, paresthesia of the feet, polydipsia, polyuria, visual disturbances, vomiting and weight loss. Patient is checking their feet daily. No foot concerns. Last diabetic eye exam eye exam:08/2017. Has one scheduled for 12/2017. Diet: Notes it was good for a while but then she got busy with life and work and went back to eating fast food more. Exercise includes constant movement at work. Denies smoking or alcohol use. Known diabetic complications: none. Immunizations: Flu vaccine: 2017, pneumococal vaccine: 07/28/2016  Other concerns: HTN: Was diagnosed in 2006, started medications in 2010. Controlled on metoprolol succinate '50mg'$  and lisinopril 2.'5mg'$  daily. Has been out of medication for the past week. Checks her bp outside of the office occasionally. Typically runs 120s-130s when she is on medication.  Has no side effects from the medication. Denies SOB, lightheadedness, dizziness, chest pain, heart palpitations, nausea, vomiting, lower leg swelling, hematuria, and lower leg swelling.   Depression: Patient followed closely by psychiatry and local therapist.  She is currently on Zoloft and just recently had lithium added.  She has been going through difficult time due to a lot of family issues.  Notes this past week has been much better.   She feels like things are going up from here.  She denies any suicidal or homicidal thoughts.  Has follow up with psych on 11/21/16.   Objective:   PHYSICAL EXAM BP (!) 154/92 (BP Location: Left Arm, Patient Position: Sitting, Cuff Size: Normal)   Pulse 94   Temp 99.2 F (37.3 C) (Oral)   Resp 18   Ht 5' 2.6" (1.59 m)   Wt 169 lb 6.4 oz (76.8 kg)   SpO2 99%   BMI 30.39 kg/m   Physical Exam  Constitutional: She is oriented to person, place, and time. She appears well-developed and well-nourished.  HENT:  Head: Normocephalic and atraumatic.  Mouth/Throat: Uvula is midline, oropharynx is clear and moist and mucous membranes are normal. No tonsillar exudate.  Eyes: Conjunctivae are normal. Pupils are equal, round, and reactive to light.  Neck: Normal range of motion.  Cardiovascular: Normal rate, regular rhythm, normal heart sounds and intact distal pulses.  Pulmonary/Chest: Effort normal and breath sounds normal. She has no wheezes. She has no rhonchi. She has no rales.  Abdominal: Soft. Normal appearance. There is no tenderness.  Musculoskeletal:       Right lower leg: She exhibits no edema.       Left lower leg: She exhibits no edema.  Neurological: She is alert and oriented to person, place, and time.  Skin: Skin is warm and dry.  Psychiatric: She has a normal mood and affect.  Vitals reviewed.   Diabetic Foot Exam - Simple   Simple Foot Form Visual Inspection No deformities, no ulcerations, no other skin breakdown bilaterally:  Yes Sensation Testing Intact to touch and monofilament testing bilaterally:  Yes Pulse Check Posterior Tibialis and Dorsalis pulse intact bilaterally:  Yes Comments    Wt Readings from Last 3 Encounters:  10/23/17 169 lb 6.4 oz (76.8 kg)  05/27/17 175 lb (79.4 kg)  05/17/17 175 lb 3.2 oz (79.5 kg)    Depression screen [facility] 2/9 10/23/2017 05/27/2017 05/17/2017 01/18/2017 11/09/2016  Decreased Interest 1 0 0 0 0  Down, Depressed, Hopeless 2 0 0 0  0  PHQ - 2 Score 3 0 0 0 0  Altered sleeping 3 - - - -  Tired, decreased energy 2 - - - -  Change in appetite 2 - - - -  Feeling bad or failure about yourself  1 - - - -  Trouble concentrating 1 - - - -  Moving slowly or fidgety/restless 0 - - - -  Suicidal thoughts 1 - - - -  PHQ-9 Score 13 - - - -  Difficult doing work/chores Somewhat difficult - - - -   Results for orders placed or performed in visit on 10/23/17 (from the past 24 hour(s))  POCT urinalysis dipstick     Status: Abnormal   Collection Time: 10/23/17  2:48 PM  Result Value Ref Range   Color, UA yellow yellow   Clarity, UA clear clear   Glucose, UA =500 (A) negative mg/dL   Bilirubin, UA negative negative   Ketones, POC UA negative negative mg/dL   Spec Grav, UA 1.015 1.010 - 1.025   Blood, UA negative negative   pH, UA 6.0 5.0 - 8.0   Protein Ur, POC negative negative mg/dL   Urobilinogen, UA 0.2 0.2 or 1.0 E.U./dL   Nitrite, UA Negative Negative   Leukocytes, UA Negative Negative  POCT glycosylated hemoglobin (Hb A1C)     Status: Abnormal   Collection Time: 10/23/17  2:52 PM  Result Value Ref Range   Hemoglobin A1C 7.8     Assessment and Plan :  1. Controlled type 2 diabetes mellitus without complication, without long-term current use of insulin (HCC) A1c has increased from 6.6 to 7.8.  UA with glucosuria, no ketones.  Patient has been out of metformin for at least the past week and has occasionally been missing doses over the past couple months.  Will add glimepiride 1 mg for 2 weeks.  Patient encouraged to check fasting blood sugar.  Goal is 90-110.  Patient encouraged to discontinue glimepiride if her  fasting blood sugars below 90.  Encouraged to contact our office or seek care immediately if it goes below 70.  Had a lengthy discussion with patient regarding lifestyle modifications including diet and exercise.  She is motivated to make these changes as she does not want to be on two diabetes medications. Plan  to follow-up in 2 weeks for fasting glucose and UA.  Given strict ED precautions. - CBC with Differential/Platelet - CMP14+EGFR - Lipid panel - POCT urinalysis dipstick - POCT glycosylated hemoglobin (Hb A1C) - HM Diabetes Foot Exam - Microalbumin/Creatinine Ratio, Urine - lisinopril (ZESTRIL) 2.5 MG tablet; Take 1 tablet (2.5 mg total) by mouth daily.  Dispense: 90 tablet; Refill: 1 - metFORMIN (GLUCOPHAGE) 850 MG tablet; Take 1 tablet twice daily with meals.  Dispense: 180 tablet; Refill: 1 - atorvastatin (LIPITOR) 10 MG tablet; Take 1 tablet (10 mg total) by mouth daily.  Dispense: 90 tablet; Refill: 1 - Flu Vaccine QUAD 36+ mos IM - glimepiride (AMARYL) 1 MG tablet; Take 1 tablet (1 mg total) by mouth daily with breakfast for 14 days.  Dispense: 14 tablet; Refill: 0  2. Essential hypertension Uncontrolled in office.  Likely to the fact that she is out of her blood pressure medication.  She is typically well controlled while on medication.  Refills given.  She is asymptomatic.  Encouraged to check BP over the next couple weeks and document these values.  Goal is <140/90.  If she remains > 140/90 with current medications, consider increasing lisinopril dose. - CMP14+EGFR - lisinopril (ZESTRIL) 2.5 MG tablet; Take 1 tablet (2.5 mg total) by mouth daily.  Dispense: 90 tablet; Refill: 1 - metoprolol succinate (TOPROL-XL) 50 MG 24 hr tablet; Take 1 tablet (50 mg total) by mouth every evening.  Dispense: 90 tablet; Refill: 1  3. Bipolar depression ([place]) Depression is improving.  Followed closely by both psychiatry and counselor.  Recommended to follow-up with them as planned.  Tenna Delaine, PA-C  [facility] 10/26/2017 8:38 PM

## 2017-10-23 NOTE — Progress Notes (Deleted)
   MRN: [id number]  Subjective:   Elizabeth [name] is a 46 y.o. female who presents for follow up of Type 2 diabetes mellitus.   Diagnosis was made ***. Patient is currently managed with {dm interventions:14074}. Admits {good/fair/poor:33178} compliance. Denies adverse effects including metallic taste, hypoglycemia, nausea, vomiting, ***.   Patient {is/are not:32546} checking home blood sugars. Home blood sugar records: {dm home sugars:14018}. Current symptoms include {dm sx:14075}. Patient denies {dm sx:19199}. Patient {is/are not:32546} checking their feet daily. *** foot concerns. Last diabetic eye exam eye exam ***.   Diet *** {diet habits:16563}. Exercise includes {exercise types:16438}. Denies smoking or alcohol use.   Known diabetic complications: {diabetes complications:1215}  Immunizations: Flu vaccine ***, shingles ***, pneumococal vaccine ***  Other concerns: ***   Objective:   PHYSICAL EXAM BP (!) 164/84 (BP Location: Left Arm, Patient Position: Sitting, Cuff Size: Normal)   Pulse 94   Temp 99.2 F (37.3 C) (Oral)   Resp 18   Ht 5' 2.6" (1.59 m)   Wt 169 lb 6.4 oz (76.8 kg)   SpO2 99%   BMI 30.39 kg/m   Physical Exam  Diabetic Foot Exam - Simple   Simple Foot Form Visual Inspection No deformities, no ulcerations, no other skin breakdown bilaterally:  Yes Sensation Testing Intact to touch and monofilament testing bilaterally:  Yes Pulse Check Posterior Tibialis and Dorsalis pulse intact bilaterally:  Yes Comments     No results found for this or any previous visit (from the past 24 hour(s)).  Assessment and Plan :  1. Controlled type 2 diabetes mellitus without complication, without long-term current use of insulin (HCC) *** - CBC with Differential/Platelet - CMP14+EGFR - Lipid panel - POCT urinalysis dipstick - POCT glycosylated hemoglobin (Hb A1C) - HM Diabetes Foot Exam - Microalbumin/Creatinine Ratio, Urine  2. Essential hypertension *** -  CMP14+EGFR

## 2017-10-23 NOTE — Patient Instructions (Addendum)
Your A1c is elevated at 7.8.  I would like you to restart metformin as prescribed.  We are also going to add an additional medication for 2 weeks.  This medication is called glimepiride.  You will take it daily with breakfast.  This medication can cause hypoglycemia.  Please check your fasting blood sugar every single morning.  If it is below 90, stop taking this medication, if it is below 70, contact our office or seek care immediately.  I would like you to return to our office in 2 weeks for fasting glucose and urinalysis.  You do not have to see a provider at this visit.  I would like you to follow-up with either our clinic or come family medicine in 3 months for reevaluation.  In terms of elevated blood pressure, please pick up medication and take as prescribed.  I would like you to check your blood pressure at least a couple times over the next week outside of the office and document these values. It is best if you check the blood pressure at different times in the day. Your goal is <140/90. If your values are consistently above this goal, please return to office for further evaluation. If you start to have chest pain, blurred vision, shortness of breath, severe headache, lower leg swelling, or nausea/vomiting please seek care immediately here or at the ED.   It was a pleasure seeing you today.  I hope you have a very Merry Christmas. DASH Eating Plan DASH stands for "Dietary Approaches to Stop Hypertension." The DASH eating plan is a healthy eating plan that has been shown to reduce high blood pressure (hypertension). It may also reduce your risk for type 2 diabetes, heart disease, and stroke. The DASH eating plan may also help with weight loss. What are tips for following this plan? General guidelines  Avoid eating more than 2,300 mg (milligrams) of salt (sodium) a day. If you have hypertension, you may need to reduce your sodium intake to 1,500 mg a day.  Limit alcohol intake to no more than 1  drink a day for nonpregnant women and 2 drinks a day for men. One drink equals 12 oz of beer, 5 oz of wine, or 1 oz of hard liquor.  Work with your health care provider to maintain a healthy body weight or to lose weight. Ask what an ideal weight is for you.  Get at least 30 minutes of exercise that causes your heart to beat faster (aerobic exercise) most days of the week. Activities may include walking, swimming, or biking.  Work with your health care provider or diet and nutrition specialist (dietitian) to adjust your eating plan to your individual calorie needs. Reading food labels  Check food labels for the amount of sodium per serving. Choose foods with less than 5 percent of the Daily Value of sodium. Generally, foods with less than 300 mg of sodium per serving fit into this eating plan.  To find whole grains, look for the word "whole" as the first word in the ingredient list. Shopping  Buy products labeled as "low-sodium" or "no salt added."  Buy fresh foods. Avoid canned foods and premade or frozen meals. Cooking  Avoid adding salt when cooking. Use salt-free seasonings or herbs instead of table salt or sea salt. Check with your health care provider or pharmacist before using salt substitutes.  Do not fry foods. Cook foods using healthy methods such as baking, boiling, grilling, and broiling instead.  Cook with heart-healthy  oils, such as olive, canola, soybean, or sunflower oil. Meal planning   Eat a balanced diet that includes: ? 5 or more servings of fruits and vegetables each day. At each meal, try to fill half of your plate with fruits and vegetables. ? Up to 6-8 servings of whole grains each day. ? Less than 6 oz of lean meat, poultry, or fish each day. A 3-oz serving of meat is about the same size as a deck of cards. One egg equals 1 oz. ? 2 servings of low-fat dairy each day. ? A serving of nuts, seeds, or beans 5 times each week. ? Heart-healthy fats. Healthy fats  called Omega-3 fatty acids are found in foods such as flaxseeds and coldwater fish, like sardines, salmon, and mackerel.  Limit how much you eat of the following: ? Canned or prepackaged foods. ? Food that is high in trans fat, such as fried foods. ? Food that is high in saturated fat, such as fatty meat. ? Sweets, desserts, sugary drinks, and other foods with added sugar. ? Full-fat dairy products.  Do not salt foods before eating.  Try to eat at least 2 vegetarian meals each week.  Eat more home-cooked food and less restaurant, buffet, and fast food.  When eating at a restaurant, ask that your food be prepared with less salt or no salt, if possible. What foods are recommended? The items listed may not be a complete list. Talk with your dietitian about what dietary choices are best for you. Grains Whole-grain or whole-wheat bread. Whole-grain or whole-wheat pasta. Brown rice. Orpah Cobbatmeal. Quinoa. Bulgur. Whole-grain and low-sodium cereals. Pita bread. Low-fat, low-sodium crackers. Whole-wheat flour tortillas. Vegetables Fresh or frozen vegetables (raw, steamed, roasted, or grilled). Low-sodium or reduced-sodium tomato and vegetable juice. Low-sodium or reduced-sodium tomato sauce and tomato paste. Low-sodium or reduced-sodium canned vegetables. Fruits All fresh, dried, or frozen fruit. Canned fruit in natural juice (without added sugar). Meat and other protein foods Skinless chicken or Mal[place]urkey. Ground chicken or [place]urkey. Pork with fat trimmed off. Fish and seafood. Egg whites. Dried beans, peas, or lentils. Unsalted nuts, nut butters, and seeds. Unsalted canned beans. Lean cuts of beef with fat trimmed off. Low-sodium, lean deli meat. Dairy Low-fat (1%) or fat-free (skim) milk. Fat-free, low-fat, or reduced-fat cheeses. Nonfat, low-sodium ricotta or cottage cheese. Low-fat or nonfat yogurt. Low-fat, low-sodium cheese. Fats and oils Soft margarine without trans fats. Vegetable oil. Low-fat,  reduced-fat, or light mayonnaise and salad dressings (reduced-sodium). Canola, safflower, olive, soybean, and sunflower oils. Avocado. Seasoning and other foods Herbs. Spices. Seasoning mixes without salt. Unsalted popcorn and pretzels. Fat-free sweets. What foods are not recommended? The items listed may not be a complete list. Talk with your dietitian about what dietary choices are best for you. Grains Baked goods made with fat, such as croissants, muffins, or some breads. Dry pasta or rice meal packs. Vegetables Creamed or fried vegetables. Vegetables in a cheese sauce. Regular canned vegetables (not low-sodium or reduced-sodium). Regular canned tomato sauce and paste (not low-sodium or reduced-sodium). Regular tomato and vegetable juice (not low-sodium or reduced-sodium). Rosita FirePickles. Olives. Fruits Canned fruit in a light or heavy syrup. Fried fruit. Fruit in cream or butter sauce. Meat and other protein foods Fatty cuts of meat. Ribs. Fried meat. Tomasa BlaseBacon. Sausage. Bologna and other processed lunch meats. Salami. Fatback. Hotdogs. Bratwurst. Salted nuts and seeds. Canned beans with added salt. Canned or smoked fish. Whole eggs or egg yolks. Chicken or Mal[place]urkey with skin. Dairy Whole or 2%  milk, cream, and half-and-half. Whole or full-fat cream cheese. Whole-fat or sweetened yogurt. Full-fat cheese. Nondairy creamers. Whipped toppings. Processed cheese and cheese spreads. Fats and oils Butter. Stick margarine. Lard. Shortening. Ghee. Bacon fat. Tropical oils, such as coconut, palm kernel, or palm oil. Seasoning and other foods Salted popcorn and pretzels. Onion salt, garlic salt, seasoned salt, table salt, and sea salt. Worcestershire sauce. Tartar sauce. Barbecue sauce. Teriyaki sauce. Soy sauce, including reduced-sodium. Steak sauce. Canned and packaged gravies. Fish sauce. Oyster sauce. Cocktail sauce. Horseradish that you find on the shelf. Ketchup. Mustard. Meat flavorings and tenderizers.  Bouillon cubes. Hot sauce and Tabasco sauce. Premade or packaged marinades. Premade or packaged taco seasonings. Relishes. Regular salad dressings. Where to find more information:  National Heart, Lung, and Blood Institute: PopSteam.is  American Heart Association: www.heart.org Summary  The DASH eating plan is a healthy eating plan that has been shown to reduce high blood pressure (hypertension). It may also reduce your risk for type 2 diabetes, heart disease, and stroke.  With the DASH eating plan, you should limit salt (sodium) intake to 2,300 mg a day. If you have hypertension, you may need to reduce your sodium intake to 1,500 mg a day.  When on the DASH eating plan, aim to eat more fresh fruits and vegetables, whole grains, lean proteins, low-fat dairy, and heart-healthy fats.  Work with your health care provider or diet and nutrition specialist (dietitian) to adjust your eating plan to your individual calorie needs. This information is not intended to replace advice given to you by your health care provider. Make sure you discuss any questions you have with your health care provider. Document Released: 10/13/2011 Document Revised: 10/17/2016 Document Reviewed: 10/17/2016 Elsevier Interactive Patient Education  2017 Elsevier Inc.  Diabetes Mellitus and Food It is important for you to manage your blood sugar (glucose) level. Your blood glucose level can be greatly affected by what you eat. Eating healthier foods in the appropriate amounts throughout the day at about the same time each day will help you control your blood glucose level. It can also help slow or prevent worsening of your diabetes mellitus. Healthy eating may even help you improve the level of your blood pressure and reach or maintain a healthy weight. General recommendations for healthful eating and cooking habits include:  Eating meals and snacks regularly. Avoid going long periods of time without eating to lose  weight.  Eating a diet that consists mainly of plant-based foods, such as fruits, vegetables, nuts, legumes, and whole grains.  Using low-heat cooking methods, such as baking, instead of high-heat cooking methods, such as deep frying.  Work with your dietitian to make sure you understand how to use the Nutrition Facts information on food labels. How can food affect me? Carbohydrates Carbohydrates affect your blood glucose level more than any other type of food. Your dietitian will help you determine how many carbohydrates to eat at each meal and teach you how to count carbohydrates. Counting carbohydrates is important to keep your blood glucose at a healthy level, especially if you are using insulin or taking certain medicines for diabetes mellitus. Alcohol Alcohol can cause sudden decreases in blood glucose (hypoglycemia), especially if you use insulin or take certain medicines for diabetes mellitus. Hypoglycemia can be a life-threatening condition. Symptoms of hypoglycemia (sleepiness, dizziness, and disorientation) are similar to symptoms of having too much alcohol. If your health care provider has given you approval to drink alcohol, do so in moderation and use the  following guidelines:  Women should not have more than one drink per day, and men should not have more than two drinks per day. One drink is equal to: ? 12 oz of beer. ? 5 oz of wine. ? 1 oz of hard liquor.  Do not drink on an empty stomach.  Keep yourself hydrated. Have water, diet soda, or unsweetened iced tea.  Regular soda, juice, and other mixers might contain a lot of carbohydrates and should be counted.  What foods are not recommended? As you make food choices, it is important to remember that all foods are not the same. Some foods have fewer nutrients per serving than other foods, even though they might have the same number of calories or carbohydrates. It is difficult to get your body what it needs when you eat  foods with fewer nutrients. Examples of foods that you should avoid that are high in calories and carbohydrates but low in nutrients include:  Trans fats (most processed foods list trans fats on the Nutrition Facts label).  Regular soda.  Juice.  Candy.  Sweets, such as cake, pie, doughnuts, and cookies.  Fried foods.  What foods can I eat? Eat nutrient-rich foods, which will nourish your body and keep you healthy. The food you should eat also will depend on several factors, including:  The calories you need.  The medicines you take.  Your weight.  Your blood glucose level.  Your blood pressure level.  Your cholesterol level.  You should eat a variety of foods, including:  Protein. ? Lean cuts of meat. ? Proteins low in saturated fats, such as fish, egg whites, and beans. Avoid processed meats.  Fruits and vegetables. ? Fruits and vegetables that may help control blood glucose levels, such as apples, mangoes, and yams.  Dairy products. ? Choose fat-free or low-fat dairy products, such as milk, yogurt, and cheese.  Grains, bread, pasta, and rice. ? Choose whole grain products, such as multigrain bread, whole oats, and brown rice. These foods may help control blood pressure.  Fats. ? Foods containing healthful fats, such as nuts, avocado, olive oil, canola oil, and fish.  Does everyone with diabetes mellitus have the same meal plan? Because every person with diabetes mellitus is different, there is not one meal plan that works for everyone. It is very important that you meet with a dietitian who will help you create a meal plan that is just right for you. This information is not intended to replace advice given to you by your health care provider. Make sure you discuss any questions you have with your health care provider. Document Released: 07/21/2005 Document Revised: 03/31/2016 Document Reviewed: 09/20/2013 Elsevier Interactive Patient Education  2017 [employer].      IF you received an x-ray today, you will receive an invoice from [facility]. Please contact [facility] at [phone] with questions or concerns regarding your invoice.   IF you received labwork today, you will receive an invoice from [place]. Please contact [facility] at [phone] with questions or concerns regarding your invoice.   Our billing staff will not be able to assist you with questions regarding bills from these companies.  You will be contacted with the lab results as soon as they are available. The fastest way to get your results is to activate your My Chart account. Instructions are located on the last page of this paperwork. If you have not heard from [place] regarding the results in 2 weeks, please contact this office.

## 2017-10-24 LAB — CMP14+EGFR
A/G RATIO: 1.9 (ref 1.2–2.2)
ALK PHOS: 87 IU/L (ref 39–117)
ALT: 42 IU/L — AB (ref 0–32)
AST: 38 IU/L (ref 0–40)
Albumin: 5 g/dL (ref 3.5–5.5)
BILIRUBIN TOTAL: 0.5 mg/dL (ref 0.0–1.2)
BUN/Creatinine Ratio: 18 (ref 9–23)
BUN: 10 mg/dL (ref 6–24)
CHLORIDE: 101 mmol/L (ref 96–106)
CO2: 21 mmol/L (ref 20–29)
Calcium: 10 mg/dL (ref 8.7–10.2)
Creatinine, Ser: 0.55 mg/dL — ABNORMAL LOW (ref 0.57–1.00)
GFR calc Af Amer: 128 mL/min/{1.73_m2} (ref >59)
GFR calc non Af Amer: 111 mL/min/{1.73_m2} (ref >59)
Globulin, Total: 2.7 g/dL (ref 1.5–4.5)
Glucose: 227 mg/dL — ABNORMAL HIGH (ref 65–99)
POTASSIUM: 4.3 mmol/L (ref 3.5–5.2)
SODIUM: 139 mmol/L (ref 134–144)
Total Protein: 7.7 g/dL (ref 6.0–8.5)

## 2017-10-24 LAB — CBC WITH DIFFERENTIAL/PLATELET
BASOS: 0 %
Basophils Absolute: 0 10*3/uL (ref 0.0–0.2)
EOS (ABSOLUTE): 0.1 10*3/uL (ref 0.0–0.4)
Eos: 1 %
Hematocrit: 42.4 % (ref 34.0–46.6)
Hemoglobin: 14.3 g/dL (ref 11.1–15.9)
IMMATURE GRANULOCYTES: 0 %
Immature Grans (Abs): 0 10*3/uL (ref 0.0–0.1)
LYMPHS ABS: 1.5 10*3/uL (ref 0.7–3.1)
Lymphs: 20 %
MCH: 29.7 pg (ref 26.6–33.0)
MCHC: 33.7 g/dL (ref 31.5–35.7)
MCV: 88 fL (ref 79–97)
MONOS ABS: 0.5 10*3/uL (ref 0.1–0.9)
Monocytes: 7 %
NEUTROS PCT: 72 %
Neutrophils Absolute: 5.6 10*3/uL (ref 1.4–7.0)
PLATELETS: 269 10*3/uL (ref 150–379)
RBC: 4.81 x10E6/uL (ref 3.77–5.28)
RDW: 13.3 % (ref 12.3–15.4)
WBC: 7.7 10*3/uL (ref 3.4–10.8)

## 2017-10-24 LAB — LIPID PANEL
Chol/HDL Ratio: 2.8 ratio (ref 0.0–4.4)
Cholesterol, Total: 175 mg/dL (ref 100–199)
HDL: 62 mg/dL (ref >39)
LDL Calculated: 73 mg/dL (ref 0–99)
TRIGLYCERIDES: 198 mg/dL — AB (ref 0–149)
VLDL Cholesterol Cal: 40 mg/dL (ref 5–40)

## 2017-10-24 LAB — MICROALBUMIN / CREATININE URINE RATIO
CREATININE, UR: 56.4 mg/dL
MICROALB/CREAT RATIO: 14.5 mg/g{creat} (ref 0.0–30.0)
MICROALBUM., U, RANDOM: 8.2 ug/mL

## 2017-10-25 ENCOUNTER — Telehealth: Payer: Self-pay

## 2017-10-25 NOTE — Telephone Encounter (Signed)
-----   Message from Tobey GrimJeffrey H Walden, MD sent at 10/23/2017  2:04 PM EST ----- Regarding: Patient from [place] to follow with me Thanks!  JW

## 2017-10-25 NOTE — Telephone Encounter (Signed)
I have LVM for pt to call and schedule an appt. She has not made an appt as of yet. If she calls, please schedule her an appt with Dr. Gwendolyn GrantWalden. Sunday SpillersSharon T Aries Townley, CMA

## 2017-10-26 ENCOUNTER — Encounter: Payer: Self-pay | Admitting: Physician Assistant

## 2017-10-28 ENCOUNTER — Encounter: Payer: Self-pay | Admitting: Radiology

## 2017-11-24 ENCOUNTER — Ambulatory Visit: Payer: Self-pay | Admitting: Family Medicine

## 2017-12-08 ENCOUNTER — Ambulatory Visit: Payer: Self-pay | Admitting: Family Medicine

## 2018-01-24 ENCOUNTER — Ambulatory Visit: Payer: Self-pay | Admitting: Physician Assistant

## 2018-07-31 ENCOUNTER — Encounter: Payer: Self-pay | Admitting: Behavioral Health

## 2018-10-01 ENCOUNTER — Other Ambulatory Visit: Payer: Self-pay | Admitting: Psychiatry

## 2018-10-02 ENCOUNTER — Other Ambulatory Visit: Payer: Self-pay

## 2018-10-02 MED ORDER — LITHIUM CARBONATE 300 MG PO CAPS: 300.0000 mg | ORAL_CAPSULE | Freq: Three times a day (TID) | ORAL | 0 refills | Status: DC

## 2018-10-02 NOTE — Telephone Encounter (Signed)
Need paper chart  

## 2018-10-08 ENCOUNTER — Telehealth: Payer: Self-pay | Admitting: Psychiatry

## 2018-10-08 NOTE — Telephone Encounter (Signed)
Spoke with pt and she only takes one a day. Contact pharmacy to let them know filled incorrectly.

## 2018-10-08 NOTE — Telephone Encounter (Signed)
[facility] called to clarigy dose on Lithium 300mg  . It was called in as 1 at HS, but in past has been 1 TID. Please confirm.

## 2018-10-08 NOTE — Telephone Encounter (Signed)
Tried to notify pt, according to our records it's one at hs. Will wait for call back to clarify

## 2018-11-08 ENCOUNTER — Ambulatory Visit: Payer: Self-pay | Admitting: Psychiatry

## 2018-11-13 ENCOUNTER — Telehealth: Payer: Self-pay | Admitting: Psychiatry

## 2018-11-13 NOTE — Telephone Encounter (Signed)
Patient requesting refill on Zoloft at [facility]. She also  Request Hinda Glatter which she states is refilled through patient assistance. Next appointment 1/28. Chart in box.

## 2018-11-14 ENCOUNTER — Other Ambulatory Visit: Payer: Self-pay

## 2018-11-14 MED ORDER — SERTRALINE HCL 100 MG PO TABS: 200.0000 mg | ORAL_TABLET | Freq: Every day | ORAL | 2 refills | Status: DC

## 2018-11-14 NOTE — Telephone Encounter (Signed)
Sertraline sent to [facility] per request and will send rx for invega for pt assistance.

## 2018-11-14 NOTE — Telephone Encounter (Signed)
Need to review paper chart to confirm medication

## 2018-12-04 ENCOUNTER — Ambulatory Visit: Payer: Self-pay | Admitting: Psychiatry

## 2018-12-07 ENCOUNTER — Encounter

## 2018-12-11 ENCOUNTER — Encounter: Payer: Self-pay | Admitting: Psychiatry

## 2018-12-11 ENCOUNTER — Ambulatory Visit: Payer: Self-pay | Admitting: Psychiatry

## 2018-12-11 DIAGNOSIS — F3281 Premenstrual dysphoric disorder: Secondary | ICD-10-CM

## 2018-12-11 DIAGNOSIS — F4001 Agoraphobia with panic disorder: Secondary | ICD-10-CM

## 2018-12-11 DIAGNOSIS — F411 Generalized anxiety disorder: Secondary | ICD-10-CM

## 2018-12-11 DIAGNOSIS — F251 Schizoaffective disorder, depressive type: Secondary | ICD-10-CM

## 2018-12-11 NOTE — Progress Notes (Signed)
Elizabeth [name] [id number] [birth year] 48 y.o.  Subjective:   Patient ID:  Elizabeth [name] is a 48 y.o. (DOB October 05, 1971) female.  Chief Complaint:  Chief Complaint  Patient presents with  . Follow-up    Medication Management    HPI Elizabeth [name] presents to the office today for follow-up of chronic depression and axiety.  Took NAC 1 month and it helped focus.  Couldn't afford it the next month.    Still cycles of depression including before Xmas.  Started a job and then started panic attacks over the production deadlines.  Couldn't keep up.  She talked with employer and resigned.  Then  Dec bills didn't get met and facing eviction.  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  Temp job still.  1 more class at school and finishes May 2 with degree in medical office admin.  Already started looking for that.  Lost 24# intentionally.  Usually sleep ok.  OK with meds overall.  Couldn't afford Artane last month and more mouth movement without it.  Better with it.  Chronic $  Stress.  Prior psychiatric medications: Sertraline 200, paliperidone 12 mg, risperidone, sertraline 100 mg, citalopram, Wellbutrin, pindolol, ziprasidone, temazepam, benztropine, amantadine, clonazepam, Ambien  Review of Systems:  Review of Systems  Neurological: Positive for tremors. Negative for weakness.  Psychiatric/Behavioral: Negative for agitation, behavioral problems, confusion, decreased concentration, dysphoric mood, hallucinations, self-injury, sleep disturbance and suicidal ideas. The patient is nervous/anxious. The patient is not hyperactive.     Medications: I have reviewed the patient's current medications.  Current Outpatient Medications  Medication Sig Dispense Refill  . atorvastatin (LIPITOR) 10 MG tablet Take 1 tablet (10 mg total) by mouth daily. 90 tablet 1  . B Complex-C (B-COMPLEX WITH VITAMIN C) tablet Take 1 tablet by mouth daily.    . fluticasone (FLONASE) 50  MCG/ACT nasal spray Place 2 sprays into both nostrils daily. 16 g 12  . Ginkgo Biloba (GINKOBA PO) Take 1 tablet by mouth 2 (two) times daily.    Marland Kitchen glucose blood test strip Use as instructed 100 each 3  . ibuprofen (ADVIL,MOTRIN) 200 MG tablet Take 200 mg by mouth every 6 (six) hours as needed.    Marland Kitchen lisinopril (ZESTRIL) 2.5 MG tablet Take 1 tablet (2.5 mg total) by mouth daily. 90 tablet 1  . lithium carbonate 300 MG capsule TAKE 1 CAPSULE BY MOUTH ONCE DAILY AT BEDTIME 90 capsule 0  . loratadine (CLARITIN) 10 MG tablet Take 1 tablet (10 mg total) by mouth daily. 14 tablet 0  . metFORMIN (GLUCOPHAGE) 850 MG tablet Take 1 tablet twice daily with meals. 180 tablet 1  . metoprolol succinate (TOPROL-XL) 50 MG 24 hr tablet Take 1 tablet (50 mg total) by mouth every evening. 90 tablet 1  . Multiple Vitamin (MULTIVITAMIN WITH MINERALS) TABS tablet Take 1 tablet by mouth daily.    . Omega-3 Fatty Acids (FISH OIL) 1000 MG CPDR Take by mouth.    . paliperidone (INVEGA) 6 MG 24 hr tablet Take 12 mg by mouth at bedtime.    . sertraline (ZOLOFT) 100 MG tablet Take 2 tablets (200 mg total) by mouth daily. 60 tablet 2  . trihexyphenidyl (ARTANE) 2 MG tablet Take 2 mg by mouth 2 (two) times daily with a meal.    . vitamin E 400 UNIT capsule Take 400 Units by mouth daily.    Marland Kitchen glimepiride (AMARYL) 1 MG tablet Take 1  tablet (1 mg total) by mouth daily with breakfast for 14 days. 14 tablet 0  . nystatin (MYCOSTATIN/NYSTOP) powder Apply topically 3 (three) times daily. (Patient not taking: Reported on 12/11/2018) 45 g 1   No current facility-administered medications for this visit.     Medication Side Effects: facial grimacing no worse  Allergies:  Allergies  Allergen Reactions  . Tomato     Throat break out   . Penicillins Other (See Comments)    Childhood reaction Has patient had a PCN reaction causing immediate rash, facial/tongue/throat swelling, SOB or lightheadedness with hypotension: YES Has patient  had a PCN reaction causing severe rash involving mucus membranes or skin necrosis: NO Has patient had a PCN reaction that required hospitalizationNO Has patient had a PCN reaction occurring within the last 10 years: NO If all of the above answers are "NO", then may proceed with Cephalosporin use.    Past Medical History:  Diagnosis Date  . Allergy   . Anxiety   . Bipolar depression ([place])   . Diabetes mellitus without complication ([place])   . Hypertension     Family History  Problem Relation Age of Onset  . Arthritis Mother   . Hypertension Mother   . Heart disease Mother   . Cancer Mother        Skin cancer  . Hyperlipidemia Mother   . Stroke Mother   . Arthritis Father        rheumatoid  . Hypertension Father   . Cancer Father        Skin cancer  . Hypertension Sister   . Thyroid disease Sister   . Mental illness Sister   . Stroke Maternal Grandmother   . Congestive Heart Failure Maternal Grandfather   . Diabetes Maternal Grandfather   . Heart disease Maternal Grandfather   . Hyperlipidemia Maternal Grandfather   . Hypertension Maternal Grandfather   . Cancer Paternal Grandmother   . Heart disease Paternal Grandfather   . Diabetes Paternal Grandfather   . Hyperlipidemia Paternal Grandfather     Social History   Socioeconomic History  . Marital status: Single    Spouse name: N/A  . Number of children: 0  . Years of education: Not on file  . Highest education level: Not on file  Occupational History  . Occupation: cashier-part time    Comment: [employer]    Employer: [employer]  . Occupation: Ship broker    Comment: medical office administration [place]  . Financial resource strain: Not on file  . Food insecurity:    Worry: Not on file    Inability: Not on file  . Transportation needs:    Medical: Not on file    Non-medical: Not on file  Tobacco Use  . Smoking status: Never Smoker  . Smokeless tobacco: Never Used  Substance and Sexual Activity   . Alcohol use: No  . Drug use: No  . Sexual activity: Not Currently    Birth control/protection: Abstinence    Comment: number of sex partners in the last 12 months  0  Lifestyle  . Physical activity:    Days per week: Not on file    Minutes per session: Not on file  . Stress: Not on file  Relationships  . Social connections:    Talks on phone: Not on file    Gets together: Not on file    Attends religious service: Not on file    Active member of club or organization: Not on  file    Attends meetings of clubs or organizations: Not on file    Relationship status: Not on file  . Intimate partner violence:    Fear of current or ex partner: Not on file    Emotionally abused: Not on file    Physically abused: Not on file    Forced sexual activity: Not on file  Other Topics Concern  . Not on file  Social History Narrative   Lives alone. Sister lives in [place].    Past Medical History, Surgical history, Social history, and Family history were reviewed and updated as appropriate.   Please see review of systems for further details on the patient's review from today.   Objective:   Physical Exam:  There were no vitals taken for this visit.  Physical Exam Constitutional:      General: She is not in acute distress.    Appearance: She is well-developed.  Musculoskeletal:        General: No deformity.  Neurological:     Mental Status: She is alert and oriented to person, place, and time.     Motor: Tremor present.     Coordination: Coordination normal.     Gait: Gait normal.     Comments: Mild facial  Grimacing.  Psychiatric:        Attention and Perception: Attention and perception normal. She is attentive.        Mood and Affect: Mood is anxious. Mood is not depressed. Affect is not labile, blunt, angry or inappropriate.        Speech: Speech normal.        Behavior: Behavior normal.        Thought Content: Thought content normal. Thought content is not paranoid.  Thought content does not include homicidal or suicidal ideation. Thought content does not include homicidal or suicidal plan.        Cognition and Memory: Cognition normal.     Comments: Insight and judgment fair.     Lab Review:     Component Value Date/Time   NA 139 10/23/2017 1419   K 4.3 10/23/2017 1419   CL 101 10/23/2017 1419   CO2 21 10/23/2017 1419   GLUCOSE 227 (H) 10/23/2017 1419   GLUCOSE 139 (H) 07/28/2016 0938   BUN 10 10/23/2017 1419   CREATININE 0.55 (L) 10/23/2017 1419   CREATININE 0.51 07/28/2016 0938   CALCIUM 10.0 10/23/2017 1419   PROT 7.7 10/23/2017 1419   ALBUMIN 5.0 10/23/2017 1419   AST 38 10/23/2017 1419   ALT 42 (H) 10/23/2017 1419   ALKPHOS 87 10/23/2017 1419   BILITOT 0.5 10/23/2017 1419   GFRNONAA 111 10/23/2017 1419   GFRNONAA >89 07/28/2016 0938   GFRAA 128 10/23/2017 1419   GFRAA >89 07/28/2016 0938       Component Value Date/Time   WBC 7.7 10/23/2017 1419   WBC 7.8 10/19/2016 1638   WBC 7.5 07/28/2016 0945   RBC 4.81 10/23/2017 1419   RBC 4.49 10/19/2016 1638   RBC 4.40 07/28/2016 0945   HGB 14.3 10/23/2017 1419   HCT 42.4 10/23/2017 1419   PLT 269 10/23/2017 1419   MCV 88 10/23/2017 1419   MCH 29.7 10/23/2017 1419   MCH 30.3 10/19/2016 1638   MCH 29.8 07/28/2016 0945   MCHC 33.7 10/23/2017 1419   MCHC 35.8 (A) 10/19/2016 1638   MCHC 34.7 07/28/2016 0945   RDW 13.3 10/23/2017 1419   LYMPHSABS 1.5 10/23/2017 1419   MONOABS 675 07/28/2016 0945  EOSABS 0.1 10/23/2017 1419   BASOSABS 0.0 10/23/2017 1419    No results found for: POCLITH, LITHIUM   No results found for: PHENYTOIN, PHENOBARB, VALPROATE, CBMZ   .res Assessment: Plan:    Schizoaffective disorder, depressive type ([place])  Panic disorder with agoraphobia  Generalized anxiety disorder  PMDD (premenstrual dysphoric disorder)   Has started app for disability.  I support this bc chronic inability to maintain employment for sufficient length of time to support  herself despite great effort.  She is easily anxious and overwhelmed at which point start she starts having problems with concentration and focus and then starts having depression.  She has been through many different jobs trying to find a good solution.  Encouraged her to perhaps seek out section 8 housing.  Chronically  Unstable.  She has limited coping skills despite extensive counseling.  We have tried multiple medications I see no indication for change at this time. FU 4 mos  Lynder Parents, MD, DFAPA   Please see After Visit Summary for patient specific instructions.  Future Appointments  Date Time Provider [place]  04/09/2019  9:15 AM Cottle, Billey Co., MD [facility] None    No orders of the defined types were placed in this encounter.     -------------------------------

## 2019-01-01 ENCOUNTER — Telehealth: Payer: Self-pay | Admitting: Psychiatry

## 2019-01-01 NOTE — Telephone Encounter (Signed)
Elizabeth [name] wishes to discuss a diagnosis that is on the printer AVS she got at last visit.

## 2019-01-02 ENCOUNTER — Telehealth: Payer: Self-pay | Admitting: Psychiatry

## 2019-01-02 ENCOUNTER — Other Ambulatory Visit: Payer: Self-pay | Admitting: Psychiatry

## 2019-01-02 MED ORDER — LORAZEPAM 0.5 MG PO TABS: 0.5000 mg | ORAL_TABLET | Freq: Every day | ORAL | 0 refills | Status: DC | PRN

## 2019-01-02 NOTE — Progress Notes (Signed)
Patient having panic attacks before work and missing some work.  She needs to work.  We will call in lorazepam 0.5 to 1 mg daily before work as needed.  Cautioned about sedation and other side effects.  Meredith Staggers MD, DFAPA

## 2019-01-02 NOTE — Telephone Encounter (Signed)
We will okay lorazepam 0.5 mg tablets 1-2 daily as needed for anxiety before going to work.  Be careful about sleepiness.  Prescription was sent in.  I will send in the prescription for the risperidone on Thursday.  Please notify patient of the above  Meredith Staggers, MD, DFAPA

## 2019-01-02 NOTE — Telephone Encounter (Signed)
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 She will be out this Sat. I checked the program and they are just now reviewing her application. Will take approximately 2 weeks. She cannot afford Invega at pharmacy but has in the past taken Risperdal in similar situations. Please call in about a 15 day supply of Risperdal to the [facility] [address] in [place], [place].  Also- Pt has complaints of more panic symptoms with leaving to go work. Some day this week she has not been able to go in to work. She has to work and cannot afford to be out but wonders if there is something that can help her with the anxiety/panic for now. She is worried about losing her job.

## 2019-01-02 NOTE — Telephone Encounter (Signed)
Discussed with office manager and understands diagnosis

## 2019-01-03 ENCOUNTER — Telehealth: Payer: Self-pay

## 2019-01-03 NOTE — Telephone Encounter (Signed)
Pt. Called and was wondering if she could get a note for missing work yesterday and today due to her anxiety and agoraphobia. She stated that she was going to pick up the Lorazepam today and hopes to be able to return to work tomorrow.

## 2019-01-03 NOTE — Telephone Encounter (Signed)
Elizabeth Pt. message to return my call.

## 2019-01-03 NOTE — Telephone Encounter (Signed)
Pt. Returned call. She verbalized understanding of the Lorazepam and to be cautious of sleepiness. Also, notified her of Risp. Being sent in today.

## 2019-01-04 ENCOUNTER — Other Ambulatory Visit: Payer: Self-pay | Admitting: Psychiatry

## 2019-01-04 MED ORDER — RISPERIDONE 4 MG PO TABS: 4.0000 mg | ORAL_TABLET | Freq: Every day | ORAL | 0 refills | Status: DC

## 2019-01-04 NOTE — Telephone Encounter (Signed)
I placed the chart in the box outside your door for you.

## 2019-01-04 NOTE — Telephone Encounter (Signed)
Prescription for risperidone 4 mg sent to [facility].  Please make sure patient is aware and has picked it up.  Meredith Staggers, MD, DFAPA

## 2019-01-04 NOTE — Progress Notes (Signed)
Patient waiting for [facility] patient assistance.  Will prescribe risperidone 4 mg which she is taken before to cover her until she gets the [place].

## 2019-01-04 NOTE — Telephone Encounter (Signed)
Sorry about that. I placed her chart in your box and doubled checked that this was the right one.

## 2019-01-07 NOTE — Telephone Encounter (Signed)
Called and left Pt. A VM on her personal phone letting her know that the Risperidone was sent to [facility] and to make sure she picks it up.

## 2019-01-30 ENCOUNTER — Other Ambulatory Visit: Payer: Self-pay | Admitting: Psychiatry

## 2019-01-30 MED ORDER — LITHIUM CARBONATE 300 MG PO CAPS: 300.0000 mg | ORAL_CAPSULE | Freq: Every day | ORAL | 1 refills | Status: DC

## 2019-01-30 NOTE — Telephone Encounter (Signed)
Can you verify dose, your last progress note said lithium 300mg  1 at hs, which seems low. I'm thinking this request is accurate?

## 2019-01-30 NOTE — Progress Notes (Signed)
Corrected low-dose lithium prescription.  Patient is taking 300 mg 1 daily

## 2019-04-03 ENCOUNTER — Other Ambulatory Visit: Payer: Self-pay

## 2019-04-03 ENCOUNTER — Ambulatory Visit: Payer: Self-pay | Admitting: Psychiatry

## 2019-04-03 ENCOUNTER — Ambulatory Visit (INDEPENDENT_AMBULATORY_CARE_PROVIDER_SITE_OTHER): Payer: Self-pay | Admitting: Psychiatry

## 2019-04-03 ENCOUNTER — Encounter: Payer: Self-pay | Admitting: Psychiatry

## 2019-04-03 DIAGNOSIS — F251 Schizoaffective disorder, depressive type: Secondary | ICD-10-CM

## 2019-04-03 DIAGNOSIS — F3281 Premenstrual dysphoric disorder: Secondary | ICD-10-CM

## 2019-04-03 DIAGNOSIS — F4001 Agoraphobia with panic disorder: Secondary | ICD-10-CM

## 2019-04-03 DIAGNOSIS — F411 Generalized anxiety disorder: Secondary | ICD-10-CM

## 2019-04-03 MED ORDER — PAROXETINE HCL 20 MG PO TABS: 40.0000 mg | ORAL_TABLET | Freq: Every day | ORAL | 1 refills | Status: DC

## 2019-04-03 NOTE — Progress Notes (Signed)
Elizabeth [name] [id number] June 23, 1971 48 y.o.   Virtual Visit via Telephone Note  I connected with pt by telephone and verified that I am speaking with the correct person using two identifiers.   I discussed the limitations, risks, security and privacy concerns of performing an evaluation and management service by telephone and the availability of in person appointments. I also discussed with the patient that there may be a patient responsible charge related to this service. The patient expressed understanding and agreed to proceed.  I discussed the assessment and treatment plan with the patient. The patient was provided an opportunity to ask questions and all were answered. The patient agreed with the plan and demonstrated an understanding of the instructions.   The patient was advised to call back or seek an in-person evaluation if the symptoms worsen or if the condition fails to improve as anticipated.  I provided 15 minutes of non-face-to-face time during this encounter. The call started at 3 and ended at 3:15. The patient was located at home and the provider was located office.   Subjective:   Patient ID:  Elizabeth [name] is a 48 y.o. (DOB June 04, 1971) female.  Chief Complaint:  Chief Complaint  Patient presents with  . Depression    Medication Management  . Follow-up    Medication Management  . Medication Refill    Zoloft  . Anxiety   HPI Elizabeth [name] presents today for follow-up of chronic depression and anxiety.  Last seen December 11, 2018.  No meds were changed.  Both anxiety and depression are worse.  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  Was a 2 year program and took her 5 years bc failed some.  Been applying for jobs that are better.  Has seasonal PT job at [employer]  No sense of purpose.  A lot of uncertainty.  More anxiety in the last 25 days since ran a fever and then started having panic when tried to go to work.   Feared losing job but didn't.  Having a hard time going to work and hard to focus as work.  Took NAC 1 month and it helped focus.  Couldn't afford it the next month.    Before Xmas Had cycles of depression including .  Started a job and then started panic attacks over the production deadlines.  Couldn't keep up.  She talked with employer and resigned.  $ problems.  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  Temp job still.  Lost 24# intentionally.  Usually sleep ok.  OK with meds overall.  Couldn't afford Artane last month and more mouth movement without it.  Better with it.  Chronic $  Stress.  Prior psychiatric medications: Sertraline 200, paliperidone 12 mg, risperidone, sertraline 100 mg, citalopram, Wellbutrin, pindolol, ziprasidone, temazepam, benztropine, amantadine, clonazepam, Ambien No history of paxil.  Review of Systems:  Review of Systems  Neurological: Positive for tremors. Negative for weakness.  Psychiatric/Behavioral: Positive for decreased concentration, depression and dysphoric mood. Negative for agitation, behavioral problems, confusion, hallucinations, self-injury, sleep disturbance and suicidal ideas. The patient is nervous/anxious. The patient is not hyperactive.     Medications: I have reviewed the patient's current medications.  Current Outpatient Medications  Medication Sig Dispense Refill  . atorvastatin (LIPITOR) 10 MG tablet Take 1 tablet (10 mg total) by mouth daily. 90 tablet 1  . B Complex-C (B-COMPLEX WITH VITAMIN C) tablet Take 1  tablet by mouth daily.    . Cholecalciferol (VITAMIN D3) 25 MCG (1000 UT) CAPS Take by mouth.    . fluticasone (FLONASE) 50 MCG/ACT nasal spray Place 2 sprays into both nostrils daily. 16 g 12  . Ginkgo Biloba (GINKOBA PO) Take 1 tablet by mouth 2 (two) times daily.    Marland Kitchen. glucose blood test strip Use as instructed 100 each 3  . lisinopril (ZESTRIL) 2.5 MG tablet Take 1 tablet (2.5 mg total) by  mouth daily. 90 tablet 1  . lithium carbonate 300 MG capsule Take 1 capsule (300 mg total) by mouth daily. 90 capsule 1  . loratadine (CLARITIN) 10 MG tablet Take 1 tablet (10 mg total) by mouth daily. 14 tablet 0  . LORazepam (ATIVAN) 0.5 MG tablet Take 1-2 tablets (0.5-1 mg total) by mouth daily as needed for anxiety (as needed for anxiety before work). 30 tablet 0  . metFORMIN (GLUCOPHAGE) 1000 MG tablet TAKE 1 TABLET BY MOUTH TWICE DAILY BEFORE MEAL(S)    . metoprolol succinate (TOPROL-XL) 50 MG 24 hr tablet Take 1 tablet (50 mg total) by mouth every evening. 90 tablet 1  . Multiple Vitamin (MULTIVITAMIN WITH MINERALS) TABS tablet Take 1 tablet by mouth daily.    . Omega-3 Fatty Acids (FISH OIL) 1000 MG CPDR Take by mouth.    . paliperidone (INVEGA) 6 MG 24 hr tablet Take 12 mg by mouth at bedtime.    . sertraline (ZOLOFT) 100 MG tablet Take 2 tablets (200 mg total) by mouth daily. 60 tablet 2  . trihexyphenidyl (ARTANE) 2 MG tablet Take 2 mg by mouth 2 (two) times daily with a meal.    . vitamin E 400 UNIT capsule Take 400 Units by mouth daily.     No current facility-administered medications for this visit.     Medication Side Effects: facial grimacing no worse  Allergies:  Allergies  Allergen Reactions  . Tomato     Throat break out   . Penicillins Other (See Comments)    Childhood reaction Has patient had a PCN reaction causing immediate rash, facial/tongue/throat swelling, SOB or lightheadedness with hypotension: YES Has patient had a PCN reaction causing severe rash involving mucus membranes or skin necrosis: NO Has patient had a PCN reaction that required hospitalizationNO Has patient had a PCN reaction occurring within the last 10 years: NO If all of the above answers are "NO", then may proceed with Cephalosporin use.    Past Medical History:  Diagnosis Date  . Allergy   . Anxiety   . Bipolar depression (HCC)   . Diabetes mellitus without complication (HCC)   .  Hypertension     Family History  Problem Relation Age of Onset  . Arthritis Mother   . Hypertension Mother   . Heart disease Mother   . Cancer Mother        Skin cancer  . Hyperlipidemia Mother   . Stroke Mother   . Arthritis Father        rheumatoid  . Hypertension Father   . Cancer Father        Skin cancer  . Hypertension Sister   . Thyroid disease Sister   . Mental illness Sister   . Stroke Maternal Grandmother   . Congestive Heart Failure Maternal Grandfather   . Diabetes Maternal Grandfather   . Heart disease Maternal Grandfather   . Hyperlipidemia Maternal Grandfather   . Hypertension Maternal Grandfather   . Cancer Paternal Grandmother   . Heart  disease Paternal Grandfather   . Diabetes Paternal Grandfather   . Hyperlipidemia Paternal Grandfather     Social History   Socioeconomic History  . Marital status: Single    Spouse name: N/A  . Number of children: 0  . Years of education: Not on file  . Highest education level: Not on file  Occupational History  . Occupation: cashier-part time    Comment: [employer]    Employer: [employer]  . Occupation: Consulting civil engineer    Comment: medical office administration [employer]  Social Needs  . Financial resource strain: Not on file  . Food insecurity:    Worry: Not on file    Inability: Not on file  . Transportation needs:    Medical: Not on file    Non-medical: Not on file  Tobacco Use  . Smoking status: Never Smoker  . Smokeless tobacco: Never Used  Substance and Sexual Activity  . Alcohol use: No  . Drug use: No  . Sexual activity: Not Currently    Birth control/protection: Abstinence    Comment: number of sex partners in the last 12 months  0  Lifestyle  . Physical activity:    Days per week: Not on file    Minutes per session: Not on file  . Stress: Not on file  Relationships  . Social connections:    Talks on phone: Not on file    Gets together: Not on file    Attends religious service: Not on file    Active  member of club or organization: Not on file    Attends meetings of clubs or organizations: Not on file    Relationship status: Not on file  . Intimate partner violence:    Fear of current or ex partner: Not on file    Emotionally abused: Not on file    Physically abused: Not on file    Forced sexual activity: Not on file  Other Topics Concern  . Not on file  Social History Narrative   Lives alone. Sister lives in [place].    Past Medical History, Surgical history, Social history, and Family history were reviewed and updated as appropriate.   Please see review of systems for further details on the patient's review from today.   Objective:   Physical Exam:  There were no vitals taken for this visit.  Physical Exam Neurological:     Mental Status: She is alert and oriented to person, place, and time.     Cranial Nerves: No dysarthria.  Psychiatric:        Attention and Perception: Attention normal. She does not perceive auditory hallucinations.        Mood and Affect: Mood is anxious and depressed.        Speech: Speech normal.        Behavior: Behavior is cooperative.        Thought Content: Thought content normal. Thought content is not paranoid or delusional. Thought content does not include homicidal or suicidal ideation. Thought content does not include homicidal or suicidal plan.        Cognition and Memory: Cognition and memory normal.        Judgment: Judgment normal.     Comments: Insight fair.  Chronically unstable but worse with anxiety and depression     Lab Review:     Component Value Date/Time   NA 139 10/23/2017 1419   K 4.3 10/23/2017 1419   CL 101 10/23/2017 1419   CO2 21 10/23/2017 1419  GLUCOSE 227 (H) 10/23/2017 1419   GLUCOSE 139 (H) 07/28/2016 0938   BUN 10 10/23/2017 1419   CREATININE 0.55 (L) 10/23/2017 1419   CREATININE 0.51 07/28/2016 0938   CALCIUM 10.0 10/23/2017 1419   PROT 7.7 10/23/2017 1419   ALBUMIN 5.0 10/23/2017 1419   AST 38  10/23/2017 1419   ALT 42 (H) 10/23/2017 1419   ALKPHOS 87 10/23/2017 1419   BILITOT 0.5 10/23/2017 1419   GFRNONAA 111 10/23/2017 1419   GFRNONAA >89 07/28/2016 0938   GFRAA 128 10/23/2017 1419   GFRAA >89 07/28/2016 0938       Component Value Date/Time   WBC 7.7 10/23/2017 1419   WBC 7.8 10/19/2016 1638   WBC 7.5 07/28/2016 0945   RBC 4.81 10/23/2017 1419   RBC 4.49 10/19/2016 1638   RBC 4.40 07/28/2016 0945   HGB 14.3 10/23/2017 1419   HCT 42.4 10/23/2017 1419   PLT 269 10/23/2017 1419   MCV 88 10/23/2017 1419   MCH 29.7 10/23/2017 1419   MCH 30.3 10/19/2016 1638   MCH 29.8 07/28/2016 0945   MCHC 33.7 10/23/2017 1419   MCHC 35.8 (A) 10/19/2016 1638   MCHC 34.7 07/28/2016 0945   RDW 13.3 10/23/2017 1419   LYMPHSABS 1.5 10/23/2017 1419   MONOABS 675 07/28/2016 0945   EOSABS 0.1 10/23/2017 1419   BASOSABS 0.0 10/23/2017 1419    No results found for: POCLITH, LITHIUM   No results found for: PHENYTOIN, PHENOBARB, VALPROATE, CBMZ   .res Assessment: Plan:    Schizoaffective disorder, depressive type (HCC)  Panic disorder with agoraphobia  Generalized anxiety disorder  PMDD (premenstrual dysphoric disorder)   Has started app for disability.  I support this bc chronic inability to maintain employment for sufficient length of time to support herself despite great effort.  She is easily anxious and overwhelmed at which point start she starts having problems with concentration and focus and then starts having depression panic attacks.  This is happening again with her current job..  She has been through many different jobs trying to find a good solution.  Encouraged her to perhaps seek out section 8 housing.  Chronically  Unstable.  She has limited coping skills despite extensive counseling.   Rec trial of paroxetine to see if this will have a better antipanic effect than the sertraline.  We discussed the risk of it causing mood cycling but that risk should not be any more  likely than sertraline.  If this fails to help sufficiently I certainly would encourage disability more aggressively.  FU 2 mos  Meredith Staggers, MD, DFAPA   Please see After Visit Summary for patient specific instructions.  No future appointments.  No orders of the defined types were placed in this encounter.     -------------------------------

## 2019-04-04 ENCOUNTER — Ambulatory Visit: Payer: Self-pay | Admitting: Psychiatry

## 2019-04-04 ENCOUNTER — Telehealth: Payer: Self-pay | Admitting: Psychiatry

## 2019-04-04 NOTE — Telephone Encounter (Signed)
We are switching from sertraline 200 mg daily to paroxetine 40 mg daily as noted below.  The patient was given these instructions specifically. Sertraline 150 mg daily with paroxetine one half of a 20 mg tablet daily for 5 days, Then sertraline 100 mg daily plus paroxetine 20 mg daily for 5 days, Then sertraline one half of 100 mg tablet +1-1/2 of a 20 mg paroxetine daily for 5 days, Then stop sertraline and increase paroxetine to 2 of the 20 mg tablets daily.  If the patient has enough sertraline to accomplish this switch then she does not need the refill.

## 2019-04-04 NOTE — Telephone Encounter (Signed)
Patient need refill on zoloft to be sent to [facility] on [address] in [place]

## 2019-04-04 NOTE — Telephone Encounter (Signed)
Left voicemail to call back and let us know how many she has left and if has enough for transition.

## 2019-04-05 ENCOUNTER — Telehealth: Payer: Self-pay | Admitting: Psychiatry

## 2019-04-05 ENCOUNTER — Other Ambulatory Visit: Payer: Self-pay

## 2019-04-05 MED ORDER — SERTRALINE HCL 100 MG PO TABS: ORAL_TABLET | ORAL | 0 refills | Status: DC

## 2019-04-05 NOTE — Telephone Encounter (Signed)
Pt request 15 tablets, sent to pharmacy

## 2019-04-05 NOTE — Telephone Encounter (Signed)
Patient returned call stated she needs 15 tablets of the Sertraline to be sent to [facility] on [address] in [place]

## 2019-04-05 NOTE — Telephone Encounter (Signed)
Submitted for 15 tablets per request for tapering off

## 2019-04-09 ENCOUNTER — Ambulatory Visit: Payer: Self-pay | Admitting: Psychiatry

## 2019-04-12 ENCOUNTER — Telehealth: Payer: Self-pay | Admitting: Psychiatry

## 2019-04-12 NOTE — Telephone Encounter (Signed)
Thank you.  I agree with rec to contact PCP.  Unlikely med related.

## 2019-04-12 NOTE — Telephone Encounter (Signed)
Changes were made to medications wants to know if Paxil causes tightness to the chest and fever.

## 2019-06-05 ENCOUNTER — Encounter: Payer: Self-pay | Admitting: Psychiatry

## 2019-06-05 ENCOUNTER — Ambulatory Visit (INDEPENDENT_AMBULATORY_CARE_PROVIDER_SITE_OTHER): Payer: Self-pay | Admitting: Psychiatry

## 2019-06-05 ENCOUNTER — Other Ambulatory Visit: Payer: Self-pay

## 2019-06-05 DIAGNOSIS — F4001 Agoraphobia with panic disorder: Secondary | ICD-10-CM

## 2019-06-05 DIAGNOSIS — F251 Schizoaffective disorder, depressive type: Secondary | ICD-10-CM

## 2019-06-05 DIAGNOSIS — F411 Generalized anxiety disorder: Secondary | ICD-10-CM

## 2019-06-05 DIAGNOSIS — G2401 Drug induced subacute dyskinesia: Secondary | ICD-10-CM

## 2019-06-05 DIAGNOSIS — F3281 Premenstrual dysphoric disorder: Secondary | ICD-10-CM

## 2019-06-05 NOTE — Progress Notes (Signed)
Elizabeth [name] [id number] 1971-02-05 48 y.o.    Subjective:   Patient ID:  Elizabeth [name] is a 47 y.o. (DOB June 08, 1971) female.  Chief Complaint:  Chief Complaint  Patient presents with  . Follow-up    Medication Management  . Depression    Medication Management  . Anxiety   HPI Elizabeth [name] presents today for follow-up of chronic depression and anxiety.  Last seen May 28 and DT depression and anxiety we switched from sertraline to paroxetine 40 mg daily.  Don't feel as anxious generally and easier to calm herself down.  Did lose her job but starts a new one tomorrow.  Lost job bc difficulty with performance. Back in the fall noticed that she was having irrational fearful thoughts.  Wanting to escape from anxious situations and tendency to run away before the med changes.  Feels a lot more rational and can recognize the mistakes she made.  Starting a job she quit in the fall.  Depression no worse and maybe a little better.  Some anniversary death dates are hard.  Starting a new job 2nd shift and has worked there in the past.  Took NAC 1 month and it helped focus.  Couldn't afford it the next month.    OK with meds overall.  Couldn't afford Artane last month and more mouth movement without it.  Better with it.  Chronic $  Stress.  Prior psychiatric medications: Sertraline 200, paliperidone 12 mg, risperidone, sertraline 100 mg, citalopram, Wellbutrin, pindolol, ziprasidone, temazepam, benztropine, amantadine, clonazepam, Ambien No history of paxil.  Review of Systems:  Review of Systems  Neurological: Positive for tremors. Negative for weakness.  Psychiatric/Behavioral: Positive for decreased concentration and dysphoric mood. Negative for agitation, behavioral problems, confusion, hallucinations, self-injury, sleep disturbance and suicidal ideas. The patient is nervous/anxious. The patient is not hyperactive.     Medications: I have reviewed the patient's current  medications.  Current Outpatient Medications  Medication Sig Dispense Refill  . albuterol (VENTOLIN HFA) 108 (90 Base) MCG/ACT inhaler Inhale into the lungs.    Marland Kitchen atorvastatin (LIPITOR) 10 MG tablet Take 1 tablet (10 mg total) by mouth daily. 90 tablet 1  . B Complex-C (B-COMPLEX WITH VITAMIN C) tablet Take 1 tablet by mouth daily.    . Cholecalciferol (VITAMIN D3) 25 MCG (1000 UT) CAPS Take by mouth.    . Ginkgo Biloba (GINKOBA PO) Take 1 tablet by mouth 2 (two) times daily.    Marland Kitchen glucose blood test strip Use as instructed 100 each 3  . lisinopril (ZESTRIL) 10 MG tablet Take 10 mg by mouth daily.    Marland Kitchen lithium carbonate 300 MG capsule Take 1 capsule (300 mg total) by mouth daily. 90 capsule 1  . loratadine (CLARITIN) 10 MG tablet Take 1 tablet (10 mg total) by mouth daily. 14 tablet 0  . LORazepam (ATIVAN) 0.5 MG tablet Take 1-2 tablets (0.5-1 mg total) by mouth daily as needed for anxiety (as needed for anxiety before work). 30 tablet 0  . metFORMIN (GLUCOPHAGE) 1000 MG tablet TAKE 1 TABLET BY MOUTH TWICE DAILY BEFORE MEAL(S)    . metoprolol succinate (TOPROL-XL) 50 MG 24 hr tablet Take 1 tablet (50 mg total) by mouth every evening. 90 tablet 1  . Multiple Vitamin (MULTIVITAMIN WITH MINERALS) TABS tablet Take 1 tablet by mouth daily.    . Omega-3 Fatty Acids (FISH OIL) 1000 MG CPDR Take by mouth.    . paliperidone (INVEGA) 6 MG 24 hr tablet Take 12  mg by mouth at bedtime.    Marland Kitchen. PARoxetine (PAXIL) 20 MG tablet Take 2 tablets (40 mg total) by mouth daily. 60 tablet 1  . sodium chloride (OCEAN) 0.65 % SOLN nasal spray Place 1 spray into both nostrils as needed for congestion.    . trihexyphenidyl (ARTANE) 2 MG tablet Take 2 mg by mouth 2 (two) times daily with a meal.    . vitamin E 400 UNIT capsule Take 400 Units by mouth daily.     No current facility-administered medications for this visit.     Medication Side Effects: facial grimacing no worse  Allergies:  Allergies  Allergen Reactions   . Tomato     Throat break out   . Penicillins Other (See Comments)    Childhood reaction Has patient had a PCN reaction causing immediate rash, facial/tongue/throat swelling, SOB or lightheadedness with hypotension: YES Has patient had a PCN reaction causing severe rash involving mucus membranes or skin necrosis: NO Has patient had a PCN reaction that required hospitalizationNO Has patient had a PCN reaction occurring within the last 10 years: NO If all of the above answers are "NO", then may proceed with Cephalosporin use.    Past Medical History:  Diagnosis Date  . Allergy   . Anxiety   . Bipolar depression (HCC)   . Diabetes mellitus without complication (HCC)   . Hypertension     Family History  Problem Relation Age of Onset  . Arthritis Mother   . Hypertension Mother   . Heart disease Mother   . Cancer Mother        Skin cancer  . Hyperlipidemia Mother   . Stroke Mother   . Arthritis Father        rheumatoid  . Hypertension Father   . Cancer Father        Skin cancer  . Hypertension Sister   . Thyroid disease Sister   . Mental illness Sister   . Stroke Maternal Grandmother   . Congestive Heart Failure Maternal Grandfather   . Diabetes Maternal Grandfather   . Heart disease Maternal Grandfather   . Hyperlipidemia Maternal Grandfather   . Hypertension Maternal Grandfather   . Cancer Paternal Grandmother   . Heart disease Paternal Grandfather   . Diabetes Paternal Grandfather   . Hyperlipidemia Paternal Grandfather     Social History   Socioeconomic History  . Marital status: Single    Spouse name: N/A  . Number of children: 0  . Years of education: Not on file  . Highest education level: Not on file  Occupational History  . Occupation: cashier-part time    Comment: [employer]    Employer: [employer]  . 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 Financial resource strain: Not on file  . Food insecurity     Worry: Not on file    Inability: Not on file  . Transportation needs    Medical: Not on file    Non-medical: Not on file  Tobacco Use  . Smoking status: Never Smoker  . Smokeless tobacco: Never Used  Substance and Sexual Activity  . Alcohol use: No  . Drug use: No  . Sexual activity: Not Currently    Birth control/protection: Abstinence    Comment: number of sex partners in the last 12 months  0  Lifestyle  . Physical activity    Days per week: Not on file    Minutes per session: Not on file  .  Stress: Not on file  Relationships  . Social Musicianconnections    Talks on phone: Not on file    Gets together: Not on file    Attends religious service: Not on file    Active member of club or organization: Not on file    Attends meetings of clubs or organizations: Not on file    Relationship status: Not on file  . Intimate partner violence    Fear of current or ex partner: Not on file    Emotionally abused: Not on file    Physically abused: Not on file    Forced sexual activity: Not on file  Other Topics Concern  . Not on file  Social History Narrative   Lives alone. Sister lives in [place].    Past Medical History, Surgical history, Social history, and Family history were reviewed and updated as appropriate.   Please see review of systems for further details on the patient's review from today.   Objective:   Physical Exam:  There were no vitals taken for this visit.  Physical Exam Neurological:     Mental Status: She is alert and oriented to person, place, and time.     Cranial Nerves: No dysarthria.  Psychiatric:        Attention and Perception: Attention normal. She does not perceive auditory hallucinations.        Mood and Affect: Mood is anxious and depressed.        Speech: Speech normal.        Behavior: Behavior is cooperative.        Thought Content: Thought content normal. Thought content is not paranoid or delusional. Thought content does not include  homicidal or suicidal ideation. Thought content does not include homicidal or suicidal plan.        Cognition and Memory: Cognition and memory normal.        Judgment: Judgment normal.     Comments: Insight fair.  Chronically unstable but worse with anxiety and depression     Lab Review:     Component Value Date/Time   NA 139 10/23/2017 1419   K 4.3 10/23/2017 1419   CL 101 10/23/2017 1419   CO2 21 10/23/2017 1419   GLUCOSE 227 (H) 10/23/2017 1419   GLUCOSE 139 (H) 07/28/2016 0938   BUN 10 10/23/2017 1419   CREATININE 0.55 (L) 10/23/2017 1419   CREATININE 0.51 07/28/2016 0938   CALCIUM 10.0 10/23/2017 1419   PROT 7.7 10/23/2017 1419   ALBUMIN 5.0 10/23/2017 1419   AST 38 10/23/2017 1419   ALT 42 (H) 10/23/2017 1419   ALKPHOS 87 10/23/2017 1419   BILITOT 0.5 10/23/2017 1419   GFRNONAA 111 10/23/2017 1419   GFRNONAA >89 07/28/2016 0938   GFRAA 128 10/23/2017 1419   GFRAA >89 07/28/2016 0938       Component Value Date/Time   WBC 7.7 10/23/2017 1419   WBC 7.8 10/19/2016 1638   WBC 7.5 07/28/2016 0945   RBC 4.81 10/23/2017 1419   RBC 4.49 10/19/2016 1638   RBC 4.40 07/28/2016 0945   HGB 14.3 10/23/2017 1419   HCT 42.4 10/23/2017 1419   PLT 269 10/23/2017 1419   MCV 88 10/23/2017 1419   MCH 29.7 10/23/2017 1419   MCH 30.3 10/19/2016 1638   MCH 29.8 07/28/2016 0945   MCHC 33.7 10/23/2017 1419   MCHC 35.8 (A) 10/19/2016 1638   MCHC 34.7 07/28/2016 0945   RDW 13.3 10/23/2017 1419   LYMPHSABS 1.5 10/23/2017 1419  MONOABS 675 07/28/2016 0945   EOSABS 0.1 10/23/2017 1419   BASOSABS 0.0 10/23/2017 1419    No results found for: POCLITH, LITHIUM   No results found for: PHENYTOIN, PHENOBARB, VALPROATE, CBMZ   .res Assessment: Plan:    Elizabeth [name] was seen today for follow-up, depression and anxiety.  Diagnoses and all orders for this visit:  Schizoaffective disorder, depressive type (HCC)  Panic disorder with agoraphobia  PMDD (premenstrual dysphoric  disorder)  Generalized anxiety disorder  Neuroleptic-induced tardive dyskinesia   Has started app for disability.  I support this bc chronic inability to maintain employment for sufficient length of time to support herself despite great effort.  She is easily anxious and overwhelmed at which point start she starts having problems with concentration and focus and then starts having depression panic attacks.  This is happening again with her current job..  She has been through many different jobs trying to find a good solution.  Encouraged her to perhaps seek out section 8 housing.  Chronically  Unstable.  She has limited coping skills despite extensive counseling.   Overall is less anxious so far with the change to Paxil but it's early to evaluate.  Depression seems a little better.  No med changes today. Continue paroxetine 40, lithium 300, paliperidone 12 mg HS and Artane 2 mg BID prn, Artane.  FU 2 mos  Meredith Staggersarey Cottle, MD, DFAPA   Please see After Visit Summary for patient specific instructions.  No future appointments.  No orders of the defined types were placed in this encounter.     -------------------------------

## 2019-06-20 ENCOUNTER — Other Ambulatory Visit: Payer: Self-pay | Admitting: Psychiatry

## 2019-06-20 DIAGNOSIS — F4001 Agoraphobia with panic disorder: Secondary | ICD-10-CM

## 2019-06-20 DIAGNOSIS — F411 Generalized anxiety disorder: Secondary | ICD-10-CM

## 2019-07-08 ENCOUNTER — Telehealth: Payer: Self-pay | Admitting: Psychiatry

## 2019-07-08 ENCOUNTER — Other Ambulatory Visit: Payer: Self-pay

## 2019-07-08 MED ORDER — TRIHEXYPHENIDYL HCL 2 MG PO TABS: 2.0000 mg | ORAL_TABLET | Freq: Two times a day (BID) | ORAL | 0 refills | Status: DC

## 2019-07-08 NOTE — Telephone Encounter (Signed)
Patient needs a refill on her artane 2mg  for her tremors to be sent to the [facility] on [address] in [place]

## 2019-07-08 NOTE — Telephone Encounter (Signed)
Rx submitted

## 2019-07-11 ENCOUNTER — Telehealth: Payer: Self-pay | Admitting: Psychiatry

## 2019-07-11 ENCOUNTER — Other Ambulatory Visit: Payer: Self-pay

## 2019-07-11 MED ORDER — LORAZEPAM 0.5 MG PO TABS: 0.5000 mg | ORAL_TABLET | Freq: Every day | ORAL | 0 refills | Status: DC | PRN

## 2019-07-11 NOTE — Telephone Encounter (Signed)
Refill called into pharmacy, has appt 09/04/2019

## 2019-07-11 NOTE — Telephone Encounter (Signed)
Elizabeth [name] has requested a refill of her lorazepam.  Has appt 10/28. Please send to [facility] on [address] in [facility].

## 2019-08-16 ENCOUNTER — Telehealth: Payer: Self-pay | Admitting: Psychiatry

## 2019-08-16 NOTE — Telephone Encounter (Signed)
Patient called and we had to send in a new script to patient assistance for her invega and she will be out tomorrow. She said that in the past dr. Clovis Pu has sent in a week supply of respidone 4 mg this is close to the invega and she can afford it she would like this called into the [facility] [address] in [place]

## 2019-08-19 ENCOUNTER — Other Ambulatory Visit: Payer: Self-pay | Admitting: Psychiatry

## 2019-08-19 MED ORDER — RISPERIDONE 4 MG PO TABS: 4.0000 mg | ORAL_TABLET | Freq: Every day | ORAL | 0 refills | Status: DC

## 2019-08-19 NOTE — Telephone Encounter (Signed)
Can you reorder her Elizabeth [name] through patient assistance?  I'll send in RX to [facility]

## 2019-08-20 ENCOUNTER — Other Ambulatory Visit: Payer: Self-pay

## 2019-08-20 ENCOUNTER — Encounter: Payer: Self-pay | Admitting: Psychiatry

## 2019-08-20 ENCOUNTER — Ambulatory Visit (INDEPENDENT_AMBULATORY_CARE_PROVIDER_SITE_OTHER): Payer: Self-pay | Admitting: Psychiatry

## 2019-08-20 DIAGNOSIS — T43505A Adverse effect of unspecified antipsychotics and neuroleptics, initial encounter: Secondary | ICD-10-CM

## 2019-08-20 DIAGNOSIS — F411 Generalized anxiety disorder: Secondary | ICD-10-CM

## 2019-08-20 DIAGNOSIS — F251 Schizoaffective disorder, depressive type: Secondary | ICD-10-CM

## 2019-08-20 DIAGNOSIS — G2401 Drug induced subacute dyskinesia: Secondary | ICD-10-CM

## 2019-08-20 DIAGNOSIS — F4001 Agoraphobia with panic disorder: Secondary | ICD-10-CM

## 2019-08-20 DIAGNOSIS — F3281 Premenstrual dysphoric disorder: Secondary | ICD-10-CM
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
## 2019-09-04 ENCOUNTER — Ambulatory Visit: Payer: Self-pay | Admitting: Psychiatry

## 2019-09-26 ENCOUNTER — Telehealth: Payer: Self-pay | Admitting: Psychiatry

## 2019-09-26 NOTE — Telephone Encounter (Signed)
Patient called and wanted you to know that on Saturday night she is feeling more sadness and depression. So she wanted the noises in her head to stop so she took two lorazapam and two of her artrane and took her the regular doses of her invega and her paxil. She didn't wake up until Monday morning. She wanted you to be aware of what she did. Please call her at [phone]

## 2019-09-26 NOTE — Telephone Encounter (Signed)
Call to check on her and see what her sx are and how long she's been feeling worse

## 2019-09-27 NOTE — Telephone Encounter (Signed)
Left voicemail to call back with symptoms and when this started

## 2019-09-27 NOTE — Telephone Encounter (Signed)
Spoke with pt. And she sated she has been more anxious and sad. This started a week and a half ago. She thought she had COVID and was very anxious but her test came back negative. She stated that this time of the year is rough for her. She was thinking about her mother and her living situation is not good. She states she is having really negative intrusive thoughts that just will not stop. She cannot get them to stop. She says she has taken more medicine than she was supposed to. She took 1 Artane and took 2 anxiety pills and when she got home took another 2 anxiety pills when she got home and another Artane. She states she did it to stop all the noise in her head. She has been very depressed and withdrawn. She says she has no one without her mom. She is not SI/HI at the moment but I advised her that if she had any SI thoughts or felt she would hurt herself to go the ED. Please advise.

## 2019-09-30 ENCOUNTER — Other Ambulatory Visit: Payer: Self-pay | Admitting: Psychiatry

## 2019-09-30 DIAGNOSIS — F411 Generalized anxiety disorder: Secondary | ICD-10-CM

## 2019-09-30 DIAGNOSIS — F4001 Agoraphobia with panic disorder: Secondary | ICD-10-CM

## 2019-09-30 MED ORDER — DULOXETINE HCL 30 MG PO CPEP: ORAL_CAPSULE | ORAL | 1 refills | Status: DC

## 2019-09-30 NOTE — Telephone Encounter (Signed)
Cancel previous instructions regarding switch from sertraline to duloxetine.  Patient was switched from sertraline to paroxetine 40 mg in the spring.  Instead have patient increase paroxetine to 60 mg daily for depression and anxiety

## 2019-09-30 NOTE — Telephone Encounter (Signed)
Pt has not been on an SNRI. Consider Effexor vs duloxetine.

## 2019-09-30 NOTE — Telephone Encounter (Signed)
Patient has never been on an SNRI for depression and anxiety.  Venlafaxine may have a better antianxiety effect but she has no health insurance and it would be more expensive to use at least initially then duloxetine.  Therefore start duloxetine 30 mg capsules 1 daily and reduce sertraline to 1-1/2 tablets daily for 1 week, Then increase duloxetine to 2 capsules daily and reduce sertraline to 1 daily for 1 week, Then increase duloxetine to 3 capsules daily and reduce sertraline to 1/2 tablet daily for 1 week, Then stop sertraline

## 2019-09-30 NOTE — Telephone Encounter (Signed)
Left pt. A Vm to return my call.

## 2019-10-01 NOTE — Telephone Encounter (Signed)
Left pt. A detailed message and asked her to call back with any questions or concerns.

## 2019-11-08 ENCOUNTER — Other Ambulatory Visit: Payer: Self-pay | Admitting: Psychiatry

## 2019-11-08 DIAGNOSIS — F411 Generalized anxiety disorder: Secondary | ICD-10-CM

## 2019-11-08 DIAGNOSIS — F4001 Agoraphobia with panic disorder: Secondary | ICD-10-CM

## 2019-11-09 NOTE — Telephone Encounter (Signed)
Elizabeth [name], can you confirm with patient she did increase Paroxetine to 60 mg per your instructions on voicemail.

## 2019-11-13 ENCOUNTER — Other Ambulatory Visit: Payer: Self-pay

## 2019-11-13 DIAGNOSIS — F4001 Agoraphobia with panic disorder: Secondary | ICD-10-CM

## 2019-11-13 DIAGNOSIS — F411 Generalized anxiety disorder: Secondary | ICD-10-CM

## 2019-11-13 MED ORDER — PAROXETINE HCL 20 MG PO TABS: 60.0000 mg | ORAL_TABLET | Freq: Every day | ORAL | 0 refills | Status: DC

## 2019-11-13 NOTE — Telephone Encounter (Signed)
Yes ,she states she is up to 60 mg.

## 2019-11-15 ENCOUNTER — Other Ambulatory Visit: Payer: Self-pay | Admitting: Psychiatry

## 2019-11-15 DIAGNOSIS — F411 Generalized anxiety disorder: Secondary | ICD-10-CM

## 2019-11-15 DIAGNOSIS — F4001 Agoraphobia with panic disorder: Secondary | ICD-10-CM

## 2019-11-20 ENCOUNTER — Ambulatory Visit: Payer: Self-pay | Admitting: Psychiatry

## 2019-12-24 ENCOUNTER — Other Ambulatory Visit: Payer: Self-pay | Admitting: Psychiatry

## 2019-12-24 ENCOUNTER — Encounter: Payer: Self-pay | Admitting: Psychiatry

## 2019-12-24 ENCOUNTER — Ambulatory Visit (INDEPENDENT_AMBULATORY_CARE_PROVIDER_SITE_OTHER): Payer: Self-pay | Admitting: Psychiatry

## 2019-12-24 ENCOUNTER — Other Ambulatory Visit: Payer: Self-pay

## 2019-12-24 DIAGNOSIS — F4001 Agoraphobia with panic disorder: Secondary | ICD-10-CM

## 2019-12-24 DIAGNOSIS — F3281 Premenstrual dysphoric disorder: Secondary | ICD-10-CM

## 2019-12-24 DIAGNOSIS — F251 Schizoaffective disorder, depressive type: Secondary | ICD-10-CM

## 2019-12-24 DIAGNOSIS — F411 Generalized anxiety disorder: Secondary | ICD-10-CM

## 2019-12-24 DIAGNOSIS — G2401 Drug induced subacute dyskinesia: Secondary | ICD-10-CM

## 2019-12-24 MED ORDER — PALIPERIDONE ER 6 MG PO TB24: 12.0000 mg | ORAL_TABLET | Freq: Every day | ORAL | 4 refills | Status: DC
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
## 2019-12-24 NOTE — Telephone Encounter (Signed)
She had visit today, you recently increased her Paxil to 60 mg but your note says continue 40 mg?

## 2019-12-30 ENCOUNTER — Other Ambulatory Visit: Payer: Self-pay

## 2019-12-30 MED ORDER — LITHIUM CARBONATE 300 MG PO CAPS: 300.0000 mg | ORAL_CAPSULE | Freq: Every day | ORAL | 1 refills | Status: DC

## 2020-01-29 ENCOUNTER — Other Ambulatory Visit: Payer: Self-pay | Admitting: Psychiatry

## 2020-01-30 ENCOUNTER — Telehealth: Payer: Self-pay | Admitting: Psychiatry

## 2020-01-30 ENCOUNTER — Other Ambulatory Visit: Payer: Self-pay

## 2020-01-30 DIAGNOSIS — F251 Schizoaffective disorder, depressive type: Secondary | ICD-10-CM

## 2020-01-30 MED ORDER — PALIPERIDONE ER 6 MG PO TB24: 12.0000 mg | ORAL_TABLET | Freq: Every day | ORAL | 4 refills | Status: DC

## 2020-01-30 NOTE — Telephone Encounter (Signed)
RX for Invega 6 mg take 2 at hs #180, submitted to [facility] in [place].

## 2020-01-30 NOTE — Telephone Encounter (Signed)
We have had a lot of issues with Elizabeth [name]'s Pt. Assistance.  They did not send her the correct amount of medication the last 2 times.  They told me on 01/16/20 they would send her other bottle of medication.  But today they said they sent the correct amount so they haven't sent any.  As of her renewal she will start using a pharmacy card so she can get the medication directly from the pharmacy and stop this mix up.  Please send a prescription to [facility] on [address] in [place].  She will be out of medication tomorrow because she didn't get all her medication in the last shipment.  We may run into the pharmacy not carrying the medication and we may have to find a special pharmacy to use.

## 2020-03-24 ENCOUNTER — Ambulatory Visit: Payer: Self-pay | Admitting: Psychiatry

## 2020-04-28 ENCOUNTER — Telehealth (INDEPENDENT_AMBULATORY_CARE_PROVIDER_SITE_OTHER): Payer: Self-pay | Admitting: Psychiatry

## 2020-04-28 ENCOUNTER — Encounter: Payer: Self-pay | Admitting: Psychiatry

## 2020-04-28 DIAGNOSIS — G2401 Drug induced subacute dyskinesia: Secondary | ICD-10-CM

## 2020-04-28 DIAGNOSIS — F3281 Premenstrual dysphoric disorder: Secondary | ICD-10-CM

## 2020-04-28 DIAGNOSIS — F4001 Agoraphobia with panic disorder: Secondary | ICD-10-CM

## 2020-04-28 DIAGNOSIS — F411 Generalized anxiety disorder: Secondary | ICD-10-CM

## 2020-04-28 DIAGNOSIS — F251 Schizoaffective disorder, depressive type: Secondary | ICD-10-CM

## 2020-04-28 MED ORDER — LORAZEPAM 0.5 MG PO TABS: 0.5000 mg | ORAL_TABLET | Freq: Every day | ORAL | 1 refills | Status: DC | PRN

## 2020-04-28 MED ORDER — PAROXETINE HCL 20 MG PO TABS: 60.0000 mg | ORAL_TABLET | Freq: Every day | ORAL | 5 refills | Status: DC

## 2020-04-28 MED ORDER — PALIPERIDONE ER 6 MG PO TB24: 12.0000 mg | ORAL_TABLET | Freq: Every day | ORAL | 3 refills | Status: DC

## 2020-04-28 MED ORDER — TRIHEXYPHENIDYL HCL 2 MG PO TABS: 2.0000 mg | ORAL_TABLET | Freq: Two times a day (BID) | ORAL | 1 refills | Status: DC
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
## 2020-06-02 ENCOUNTER — Telehealth: Payer: Self-pay | Admitting: Psychiatry

## 2020-06-02 NOTE — Telephone Encounter (Signed)
Pt has sent over a letter from [employer] Atty about her application for SS disability. Given to provider.

## 2020-09-18 ENCOUNTER — Other Ambulatory Visit: Payer: Self-pay | Admitting: Psychiatry

## 2020-09-21 ENCOUNTER — Encounter: Payer: Self-pay | Admitting: Psychiatry

## 2020-09-21 ENCOUNTER — Telehealth (INDEPENDENT_AMBULATORY_CARE_PROVIDER_SITE_OTHER): Payer: Self-pay | Admitting: Psychiatry

## 2020-09-21 DIAGNOSIS — F411 Generalized anxiety disorder: Secondary | ICD-10-CM

## 2020-09-21 DIAGNOSIS — T43505A Adverse effect of unspecified antipsychotics and neuroleptics, initial encounter: Secondary | ICD-10-CM

## 2020-09-21 DIAGNOSIS — F3281 Premenstrual dysphoric disorder: Secondary | ICD-10-CM

## 2020-09-21 DIAGNOSIS — G2401 Drug induced subacute dyskinesia: Secondary | ICD-10-CM

## 2020-09-21 DIAGNOSIS — F251 Schizoaffective disorder, depressive type: Secondary | ICD-10-CM

## 2020-09-21 DIAGNOSIS — F4001 Agoraphobia with panic disorder: Secondary | ICD-10-CM

## 2020-09-21 MED ORDER — TRIHEXYPHENIDYL HCL 2 MG PO TABS: 2.0000 mg | ORAL_TABLET | Freq: Two times a day (BID) | ORAL | 1 refills | Status: DC

## 2020-09-21 MED ORDER — LITHIUM CARBONATE 300 MG PO CAPS: 300.0000 mg | ORAL_CAPSULE | Freq: Every day | ORAL | 1 refills | Status: DC
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
## 2021-01-25 ENCOUNTER — Other Ambulatory Visit: Payer: Self-pay | Admitting: Psychiatry

## 2021-01-25 DIAGNOSIS — F251 Schizoaffective disorder, depressive type: Secondary | ICD-10-CM

## 2021-01-25 DIAGNOSIS — F411 Generalized anxiety disorder: Secondary | ICD-10-CM

## 2021-01-25 DIAGNOSIS — F4001 Agoraphobia with panic disorder: Secondary | ICD-10-CM

## 2021-03-10 ENCOUNTER — Other Ambulatory Visit: Payer: Self-pay | Admitting: Psychiatry

## 2021-03-10 DIAGNOSIS — F4001 Agoraphobia with panic disorder: Secondary | ICD-10-CM

## 2021-03-10 DIAGNOSIS — F411 Generalized anxiety disorder: Secondary | ICD-10-CM

## 2021-03-22 ENCOUNTER — Encounter: Payer: Self-pay | Admitting: Psychiatry

## 2021-03-22 ENCOUNTER — Ambulatory Visit (INDEPENDENT_AMBULATORY_CARE_PROVIDER_SITE_OTHER): Payer: Self-pay | Admitting: Psychiatry

## 2021-03-22 ENCOUNTER — Other Ambulatory Visit: Payer: Self-pay

## 2021-03-22 DIAGNOSIS — F251 Schizoaffective disorder, depressive type: Secondary | ICD-10-CM

## 2021-03-22 DIAGNOSIS — G2401 Drug induced subacute dyskinesia: Secondary | ICD-10-CM

## 2021-03-22 DIAGNOSIS — F3281 Premenstrual dysphoric disorder: Secondary | ICD-10-CM

## 2021-03-22 DIAGNOSIS — T43505A Adverse effect of unspecified antipsychotics and neuroleptics, initial encounter: Secondary | ICD-10-CM

## 2021-03-22 DIAGNOSIS — F411 Generalized anxiety disorder: Secondary | ICD-10-CM

## 2021-03-22 DIAGNOSIS — G251 Drug-induced tremor: Secondary | ICD-10-CM

## 2021-03-22 DIAGNOSIS — F4001 Agoraphobia with panic disorder: Secondary | ICD-10-CM

## 2021-03-22 MED ORDER — LITHIUM CARBONATE 300 MG PO CAPS: 300.0000 mg | ORAL_CAPSULE | Freq: Every day | ORAL | 3 refills | Status: DC

## 2021-03-22 MED ORDER — PALIPERIDONE ER 6 MG PO TB24: 12.0000 mg | ORAL_TABLET | Freq: Every day | ORAL | 3 refills | Status: DC

## 2021-03-22 MED ORDER — PAROXETINE HCL 20 MG PO TABS: 40.0000 mg | ORAL_TABLET | Freq: Every day | ORAL | 1 refills | Status: DC

## 2021-03-22 MED ORDER — TRIHEXYPHENIDYL HCL 2 MG PO TABS: 2.0000 mg | ORAL_TABLET | Freq: Two times a day (BID) | ORAL | 1 refills | Status: DC
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
## 2021-04-15 ENCOUNTER — Other Ambulatory Visit: Payer: Self-pay | Admitting: Psychiatry

## 2021-04-15 DIAGNOSIS — F251 Schizoaffective disorder, depressive type: Secondary | ICD-10-CM

## 2021-07-13 ENCOUNTER — Telehealth: Payer: Self-pay

## 2021-07-13 ENCOUNTER — Other Ambulatory Visit: Payer: Self-pay | Admitting: Psychiatry

## 2021-07-13 DIAGNOSIS — F411 Generalized anxiety disorder: Secondary | ICD-10-CM

## 2021-07-13 MED ORDER — LORAZEPAM 0.5 MG PO TABS: 0.5000 mg | ORAL_TABLET | Freq: Every day | ORAL | 1 refills | Status: DC | PRN

## 2021-07-13 NOTE — Telephone Encounter (Signed)
RX sent

## 2021-08-12 ENCOUNTER — Encounter: Payer: Self-pay | Admitting: Psychiatry

## 2021-08-12 ENCOUNTER — Ambulatory Visit (INDEPENDENT_AMBULATORY_CARE_PROVIDER_SITE_OTHER): Payer: Self-pay | Admitting: Psychiatry

## 2021-08-12 DIAGNOSIS — F4001 Agoraphobia with panic disorder: Secondary | ICD-10-CM

## 2021-08-12 DIAGNOSIS — G2401 Drug induced subacute dyskinesia: Secondary | ICD-10-CM

## 2021-08-12 DIAGNOSIS — G251 Drug-induced tremor: Secondary | ICD-10-CM

## 2021-08-12 DIAGNOSIS — T43505A Adverse effect of unspecified antipsychotics and neuroleptics, initial encounter: Secondary | ICD-10-CM

## 2021-08-12 DIAGNOSIS — F251 Schizoaffective disorder, depressive type: Secondary | ICD-10-CM

## 2021-08-12 DIAGNOSIS — F3281 Premenstrual dysphoric disorder: Secondary | ICD-10-CM

## 2021-08-12 DIAGNOSIS — F411 Generalized anxiety disorder: Secondary | ICD-10-CM

## 2021-08-12 MED ORDER — TRIHEXYPHENIDYL HCL 2 MG PO TABS: 2.0000 mg | ORAL_TABLET | Freq: Two times a day (BID) | ORAL | 1 refills | Status: DC

## 2021-08-12 MED ORDER — PAROXETINE HCL 20 MG PO TABS: 40.0000 mg | ORAL_TABLET | Freq: Every day | ORAL | 1 refills | Status: DC

## 2021-08-12 MED ORDER — LITHIUM CARBONATE 300 MG PO CAPS: 300.0000 mg | ORAL_CAPSULE | Freq: Every day | ORAL | 1 refills | Status: DC

## 2021-08-12 MED ORDER — INVEGA 9 MG PO TB24: 9.0000 mg | ORAL_TABLET | ORAL | 1 refills | Status: DC
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
## 2021-09-22 ENCOUNTER — Ambulatory Visit: Payer: Self-pay | Admitting: Psychiatry

## 2021-10-22 ENCOUNTER — Other Ambulatory Visit: Payer: Self-pay | Admitting: Psychiatry

## 2021-10-22 ENCOUNTER — Telehealth: Payer: Self-pay | Admitting: Psychiatry

## 2021-10-22 MED ORDER — PALIPERIDONE ER 6 MG PO TB24: 12.0000 mg | ORAL_TABLET | Freq: Every day | ORAL | 0 refills | Status: DC

## 2021-10-22 NOTE — Telephone Encounter (Signed)
Ellawyn just called stating that she has been depressed since the first of October when her Invega 9 mg was adjusted. Ronesha states that she wishes she would go to sleep and never wake up, she doesn't have a plan of action but does have thoughts of suicide, having crying spells, and real angry. She does live by herself. She has been taking her medications daily. She wonders if the adjustment to her Hinda Glatter in October has something to do with her thoughts. Her phone number is [phone].

## 2021-10-22 NOTE — Telephone Encounter (Signed)
Spoke to pt.She did not give any more info other then what is stated above.I gave her the info for [facility] and [facility] and informed her I will call back after hearing from you

## 2021-10-22 NOTE — Telephone Encounter (Signed)
Pt informed me as of November 30th she could no longer get pt assistance with this med.Can you help with this?

## 2021-10-22 NOTE — Telephone Encounter (Signed)
Tell pt to increase Invega back to 2 of the 6 mg tablets.  I sent RX to [facility].  If she gets this mailed to her from pt assistance she will need to call the office next week to get it fixed

## 2021-10-25 NOTE — Telephone Encounter (Signed)
As noted.  My recommendation is to increase Invega to 12 mg daily

## 2021-11-03 ENCOUNTER — Telehealth: Payer: Self-pay | Admitting: Psychiatry

## 2021-11-03 NOTE — Telephone Encounter (Signed)
Please see phone message from patient.  °

## 2021-11-03 NOTE — Telephone Encounter (Signed)
Pt is wondering if she should make a change to a psychiatrist at [facility] in [facility].  She doesn't know what [facility] stands for, but she says they help people with disabilities find job and they have their own mental health providers there.  They have told her she can switch to them and would prefer if she was seeing a therapist and a counselor under one group.  She said she had thought it would be better in case she felt SI, she could go to the hospital up the street from her instead of having to come to [facility] [employer] from [employer].  She said she wanted to give Dr. Jennelle Human the courtesy of advising what he thought since she has been seeing him a long time.  She wasn't sure if he wanted her to wait until she was more stable on her meds.  Next appt 2/8

## 2021-11-03 NOTE — Telephone Encounter (Signed)
I think it is a good idea for her to seek treatment at the [facility] bc it sounds like they can provide her additional services beyond what we can do.  Her reasoning on this is good and I support her seeking treatment elsewhere if it is better for her.  Tell her I said Happy New Year.  I can continue her refills until she can schedule with a psychiatric medical provider there.

## 2021-11-04 NOTE — Telephone Encounter (Signed)
Called patient with the information provided by Dr. Jennelle Human.

## 2021-11-09 ENCOUNTER — Telehealth: Payer: Self-pay | Admitting: Psychiatry

## 2021-11-09 DIAGNOSIS — F251 Schizoaffective disorder, depressive type: Secondary | ICD-10-CM

## 2021-11-09 NOTE — Telephone Encounter (Signed)
Patient lvm stating she was unable to get the [facility] as it to expensive. She asked if she could go back to 2mg  or could she temporairly take Risperdone states that its been done in the past. Please rtc to discuss [phone]

## 2021-11-09 NOTE — Telephone Encounter (Signed)
See phone message. Patient said the 6 mg Hinda Glatter is $193 and that is too much money for one medication. She said in the past that you had given her Risperdal for a few days. She said she had signed the ROI today for [facility], but that it would be about 2 more months before she would be assigned a psychiatrist.  She said she does have some 9 mg tablets left, but that dosage "knocks her for a loop."

## 2021-11-10 ENCOUNTER — Other Ambulatory Visit: Payer: Self-pay | Admitting: Psychiatry

## 2021-11-10 MED ORDER — RISPERIDONE 4 MG PO TABS: 4.0000 mg | ORAL_TABLET | Freq: Every evening | ORAL | 1 refills | Status: DC

## 2021-11-10 NOTE — Telephone Encounter (Signed)
Noted  

## 2021-11-10 NOTE — Telephone Encounter (Signed)
In the past she took 2 of the 6 mg Invega tablets.  Apparently recently she has been just taking one of the 6 mg tablets or one of the 9 mg tablets?  Please verify that she has not been taking 2 of the 9 mg tablets because that would clearly be too high.  Assuming that one 9 mg tablet is too strong for her given the cost problems with Invega, please send in a prescription for Risperdal 4 mg tablets #30, 1 tablet nightly and 1 refill. Please stop the Invega and start the Risperdal in its place.

## 2021-12-15 ENCOUNTER — Encounter: Payer: Self-pay | Admitting: Psychiatry

## 2021-12-15 ENCOUNTER — Ambulatory Visit (INDEPENDENT_AMBULATORY_CARE_PROVIDER_SITE_OTHER): Payer: Self-pay | Admitting: Psychiatry

## 2021-12-15 DIAGNOSIS — F411 Generalized anxiety disorder: Secondary | ICD-10-CM

## 2021-12-15 DIAGNOSIS — F4001 Agoraphobia with panic disorder: Secondary | ICD-10-CM

## 2021-12-15 DIAGNOSIS — G251 Drug-induced tremor: Secondary | ICD-10-CM

## 2021-12-15 DIAGNOSIS — T43505A Adverse effect of unspecified antipsychotics and neuroleptics, initial encounter: Secondary | ICD-10-CM

## 2021-12-15 DIAGNOSIS — F251 Schizoaffective disorder, depressive type: Secondary | ICD-10-CM

## 2021-12-15 DIAGNOSIS — G2401 Drug induced subacute dyskinesia: Secondary | ICD-10-CM
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
## 2022-01-13 ENCOUNTER — Telehealth: Payer: Self-pay | Admitting: Psychiatry

## 2022-01-13 NOTE — Telephone Encounter (Signed)
Increase risperidone to 1-1/2 of the 4 mg tablets each evening ?

## 2022-01-13 NOTE — Telephone Encounter (Signed)
Pt stated for the past week she has been having on and off anxiety/crying spells.Not sure where it's coming from.The only thing she is taking different is she takes artane once a day instead of BID.Last note mentioned possibly increasing Risperdal

## 2022-01-13 NOTE — Telephone Encounter (Signed)
Next visit is 04/14/22. Elizabeth [name] called and said that she missed work yesterday because she had a lot of anxiety and crying spells. The last time she came in she said Dr. Jennelle Human had made some changes to her med's. She has had a lot anxiety, crying spells and depression for about a week. Her phone number is [phone]. ?

## 2022-01-14 ENCOUNTER — Other Ambulatory Visit: Payer: Self-pay

## 2022-01-14 MED ORDER — RISPERIDONE 4 MG PO TABS: 6.0000 mg | ORAL_TABLET | Freq: Every evening | ORAL | 0 refills | Status: DC

## 2022-01-14 NOTE — Telephone Encounter (Signed)
Pt informed and sent updated rx ?

## 2022-01-15 ENCOUNTER — Telehealth: Payer: Self-pay | Admitting: Psychiatry

## 2022-01-15 ENCOUNTER — Other Ambulatory Visit (HOSPITAL_COMMUNITY)
Admission: EM | Admit: 2022-01-15 | Discharge: 2022-01-19 | Disposition: A | Discharge status: Home / Self Care | Payer: No Payment, Other | Attending: Nurse Practitioner | Admitting: Nurse Practitioner

## 2022-01-15 DIAGNOSIS — Z20822 Contact with and (suspected) exposure to covid-19: Secondary | ICD-10-CM | POA: Diagnosis not present

## 2022-01-15 DIAGNOSIS — R059 Cough, unspecified: Secondary | ICD-10-CM | POA: Insufficient documentation

## 2022-01-15 DIAGNOSIS — F251 Schizoaffective disorder, depressive type: Secondary | ICD-10-CM | POA: Diagnosis present

## 2022-01-15 DIAGNOSIS — Z833 Family history of diabetes mellitus: Secondary | ICD-10-CM | POA: Insufficient documentation

## 2022-01-15 DIAGNOSIS — Z8349 Family history of other endocrine, nutritional and metabolic diseases: Secondary | ICD-10-CM | POA: Insufficient documentation

## 2022-01-15 DIAGNOSIS — E119 Type 2 diabetes mellitus without complications: Secondary | ICD-10-CM | POA: Insufficient documentation

## 2022-01-15 DIAGNOSIS — F319 Bipolar disorder, unspecified: Secondary | ICD-10-CM | POA: Insufficient documentation

## 2022-01-15 DIAGNOSIS — F4310 Post-traumatic stress disorder, unspecified: Secondary | ICD-10-CM | POA: Insufficient documentation

## 2022-01-15 DIAGNOSIS — R0981 Nasal congestion: Secondary | ICD-10-CM | POA: Insufficient documentation

## 2022-01-15 DIAGNOSIS — Z9109 Other allergy status, other than to drugs and biological substances: Secondary | ICD-10-CM | POA: Insufficient documentation

## 2022-01-15 DIAGNOSIS — I10 Essential (primary) hypertension: Secondary | ICD-10-CM | POA: Insufficient documentation

## 2022-01-15 DIAGNOSIS — G2401 Drug induced subacute dyskinesia: Secondary | ICD-10-CM | POA: Insufficient documentation

## 2022-01-15 DIAGNOSIS — R4587 Impulsiveness: Secondary | ICD-10-CM | POA: Insufficient documentation

## 2022-01-15 DIAGNOSIS — F410 Panic disorder [episodic paroxysmal anxiety] without agoraphobia: Secondary | ICD-10-CM | POA: Insufficient documentation

## 2022-01-15 DIAGNOSIS — Z8249 Family history of ischemic heart disease and other diseases of the circulatory system: Secondary | ICD-10-CM | POA: Insufficient documentation

## 2022-01-15 DIAGNOSIS — Z7984 Long term (current) use of oral hypoglycemic drugs: Secondary | ICD-10-CM | POA: Insufficient documentation

## 2022-01-15 DIAGNOSIS — R45851 Suicidal ideations: Secondary | ICD-10-CM | POA: Diagnosis not present

## 2022-01-15 DIAGNOSIS — Z818 Family history of other mental and behavioral disorders: Secondary | ICD-10-CM | POA: Insufficient documentation

## 2022-01-15 DIAGNOSIS — Z602 Problems related to living alone: Secondary | ICD-10-CM | POA: Insufficient documentation

## 2022-01-15 DIAGNOSIS — R450 Nervousness: Secondary | ICD-10-CM | POA: Insufficient documentation

## 2022-01-15 DIAGNOSIS — F411 Generalized anxiety disorder: Secondary | ICD-10-CM | POA: Diagnosis not present

## 2022-01-15 DIAGNOSIS — Z9151 Personal history of suicidal behavior: Secondary | ICD-10-CM | POA: Insufficient documentation

## 2022-01-15 DIAGNOSIS — R682 Dry mouth, unspecified: Secondary | ICD-10-CM | POA: Insufficient documentation

## 2022-01-15 DIAGNOSIS — Z79899 Other long term (current) drug therapy: Secondary | ICD-10-CM | POA: Insufficient documentation

## 2022-01-15 DIAGNOSIS — E785 Hyperlipidemia, unspecified: Secondary | ICD-10-CM | POA: Insufficient documentation

## 2022-01-15 LAB — COMPREHENSIVE METABOLIC PANEL
ALT: 53 U/L — ABNORMAL HIGH (ref 0–44)
AST: 36 U/L (ref 15–41)
Albumin: 4.3 g/dL (ref 3.5–5.0)
Alkaline Phosphatase: 84 U/L (ref 38–126)
Anion gap: 14 (ref 5–15)
BUN: 13 mg/dL (ref 6–20)
CO2: 22 mmol/L (ref 22–32)
Calcium: 9.8 mg/dL (ref 8.9–10.3)
Chloride: 95 mmol/L — ABNORMAL LOW (ref 98–111)
Creatinine, Ser: 0.61 mg/dL (ref 0.44–1.00)
GFR, Estimated: >60 mL/min (ref >60)
Glucose, Bld: 296 mg/dL — ABNORMAL HIGH (ref 70–99)
Potassium: 3.5 mmol/L (ref 3.5–5.1)
Sodium: 131 mmol/L — ABNORMAL LOW (ref 135–145)
Total Bilirubin: 0.7 mg/dL (ref 0.3–1.2)
Total Protein: 7.1 g/dL (ref 6.5–8.1)

## 2022-01-15 LAB — LIPID PANEL
Cholesterol: 247 mg/dL — ABNORMAL HIGH (ref 0–200)
HDL: 66 mg/dL (ref >40)
LDL Cholesterol: UNDETERMINED mg/dL (ref 0–99)
Total CHOL/HDL Ratio: 3.7 RATIO
Triglycerides: 433 mg/dL — ABNORMAL HIGH (ref <150)
VLDL: UNDETERMINED mg/dL (ref 0–40)

## 2022-01-15 LAB — CBC WITH DIFFERENTIAL/PLATELET
Abs Immature Granulocytes: 0.03 10*3/uL (ref 0.00–0.07)
Basophils Absolute: 0 10*3/uL (ref 0.0–0.1)
Basophils Relative: 0 %
Eosinophils Absolute: 0.2 10*3/uL (ref 0.0–0.5)
Eosinophils Relative: 3 %
HCT: 41.1 % (ref 36.0–46.0)
Hemoglobin: 14.2 g/dL (ref 12.0–15.0)
Immature Granulocytes: 0 %
Lymphocytes Relative: 31 %
Lymphs Abs: 2.5 10*3/uL (ref 0.7–4.0)
MCH: 29.3 pg (ref 26.0–34.0)
MCHC: 34.5 g/dL (ref 30.0–36.0)
MCV: 84.7 fL (ref 80.0–100.0)
Monocytes Absolute: 0.8 10*3/uL (ref 0.1–1.0)
Monocytes Relative: 10 %
Neutro Abs: 4.6 10*3/uL (ref 1.7–7.7)
Neutrophils Relative %: 56 %
Platelets: 285 10*3/uL (ref 150–400)
RBC: 4.85 MIL/uL (ref 3.87–5.11)
RDW: 13.8 % (ref 11.5–15.5)
WBC: 8.2 10*3/uL (ref 4.0–10.5)
nRBC: 0 % (ref 0.0–0.2)

## 2022-01-15 LAB — TSH: TSH: 1.805 u[IU]/mL (ref 0.350–4.500)

## 2022-01-15 LAB — RESP PANEL BY RT-PCR (FLU A&B, COVID) ARPGX2
Influenza A by PCR: NEGATIVE
Influenza B by PCR: NEGATIVE
SARS Coronavirus 2 by RT PCR: NEGATIVE

## 2022-01-15 LAB — POCT URINE DRUG SCREEN - MANUAL ENTRY (I-SCREEN)
POC Amphetamine UR: NOT DETECTED
POC Buprenorphine (BUP): NOT DETECTED
POC Cocaine UR: NOT DETECTED
POC Marijuana UR: NOT DETECTED
POC Methadone UR: NOT DETECTED
POC Methamphetamine UR: NOT DETECTED
POC Morphine: NOT DETECTED
POC Oxazepam (BZO): NOT DETECTED
POC Oxycodone UR: NOT DETECTED
POC Secobarbital (BAR): NOT DETECTED

## 2022-01-15 LAB — POC SARS CORONAVIRUS 2 AG -  ED: SARS Coronavirus 2 Ag: NEGATIVE

## 2022-01-15 LAB — POC SARS CORONAVIRUS 2 AG: SARSCOV2ONAVIRUS 2 AG: NEGATIVE

## 2022-01-15 LAB — LITHIUM LEVEL: Lithium Lvl: <0.06 mmol/L — ABNORMAL LOW (ref 0.60–1.20)

## 2022-01-15 MED ORDER — ALUM & MAG HYDROXIDE-SIMETH 200-200-20 MG/5ML PO SUSP: 30.0000 mL | ORAL | Status: DC | PRN

## 2022-01-15 MED ORDER — HYDROXYZINE HCL 25 MG PO TABS: 25.0000 mg | ORAL_TABLET | Freq: Three times a day (TID) | ORAL | Status: DC | PRN

## 2022-01-15 MED ORDER — ACETAMINOPHEN 325 MG PO TABS: 650.0000 mg | ORAL_TABLET | Freq: Four times a day (QID) | ORAL | Status: DC | PRN

## 2022-01-15 MED ORDER — TRAZODONE HCL 50 MG PO TABS: 50.0000 mg | ORAL_TABLET | Freq: Every evening | ORAL | Status: DC | PRN

## 2022-01-15 MED ORDER — MAGNESIUM HYDROXIDE 400 MG/5ML PO SUSP: 30.0000 mL | Freq: Every day | ORAL | Status: DC | PRN

## 2022-01-15 NOTE — ED Provider Notes (Signed)
Behavioral Health Admission H&P [facility] & OBS)  Date: 01/16/22 Patient Name: Elizabeth [name] MRN: [id number] Chief Complaint: suicidal ideations    Diagnoses:  Final diagnoses:  Schizoaffective disorder, depressive type ([place])  GAD (generalized anxiety disorder)    HPI:  Elizabeth [name] is a 51 y/o single female with a history of MDD , schizoaffective d/o and GAD presenting voluntarily at  [facility] for suicidal ideations with a plan to take all of her pills. Patient states that she lives alone and she called a friend Richardo Priest tonight to bring her to [facility]. Patient reports that she had a medication change from invega to risperdal in January 2023 because she felt as if the invega was not working. Patient reports that she has been on invega for about 10 years. Patient was switched to risperdal  6 mg qd for about 6 weeks now and has had an exacerbation of her depression symptoms and  SI. Patient was continues on paroxetine 40mg , lithium 300 mg qd, artane 1 mg bid. Patient is being treated for tardive dyskinesia concentrated in mouth movements. Patient endorses SI with a plan to take all of her pills and go to sleep in order to have a piece of mind.   Patient is being followed by Dr. Clovis Pu at [facility] who has been her psychiatrist for the past 20 years. Patient most recent office visit with Dr. Clovis Pu was 12/15/21 with no depression noted during the visit but with increased paranoia, and anxiety. Patient denies any alcohol use or any other illicit substances. Patient denies any SI/HI or AVH. Patient will be admitted to [facility] for safety, crisis management and patient stabilization.  PHQ 2-9:  [place] Visit from 10/23/2017 in Primary Care at [place] from 01/24/2014 in Primary Care at [place] that you would be better off dead, or of hurting yourself in some way Several days Several days  PHQ-9 Total Score 13 [place] [facility] [facility] from 01/15/2022 in [place]        Total Time spent with patient: 30 minutes  Musculoskeletal  Strength & Muscle Tone: within normal limits Gait & Station: normal Patient leans: N/A  Psychiatric Specialty Exam  Presentation General Appearance: Casual  Eye Contact:Good  Speech:Clear and Coherent  Speech Volume:Normal  Handedness:Right   Mood and Affect  Mood:Depressed; Hopeless  Affect:Depressed   Thought Process  Thought Processes:Goal Directed  Descriptions of Associations:Intact  Orientation:Full (Time, Place and Person)  Thought Content:WDL    Hallucinations:Hallucinations: None  Ideas of Reference:None  Suicidal Thoughts:Suicidal Thoughts: Yes, Active SI Active Intent and/or Plan: With Intent; With Plan  Homicidal Thoughts:Homicidal Thoughts: No   Sensorium  Memory:Immediate Good; Recent Good; Remote Good  Judgment:Fair  Insight:Good   Executive Functions  Concentration:Good  Attention Span:Fair  [place] of Knowledge:Good  Language:Good   Psychomotor Activity  Psychomotor Activity:Psychomotor Activity: Extrapyramidal Side Effects (EPS) Extrapyramidal Side Effects (EPS): Tardive Dyskinesia (mouth movements)   Assets  Assets:Communication Skills; Desire for Improvement; Housing; Catering manager; Physical Health; Resilience   Sleep  Sleep:Number of Hours of Sleep: -1   Nutritional Assessment (For OBS and FBC admissions only) Has the patient had a weight loss or gain of 10 pounds or more in the last 3 months?: No Has the patient had a decrease in food intake/or appetite?: No Does the patient have dental problems?: No Does the patient have  eating habits or behaviors that may be indicators of an eating disorder including binging or inducing vomiting?: No Has the patient recently lost weight without trying?: 0 Has the patient been eating poorly because of a decreased appetite?:  0 Malnutrition Screening Tool Score: 0    Physical Exam HENT:     Head: Normocephalic and atraumatic.     Nose: Nose normal.  Eyes:     Pupils: Pupils are equal, round, and reactive to light.  Cardiovascular:     Rate and Rhythm: Normal rate.  Pulmonary:     Effort: Pulmonary effort is normal.  Abdominal:     General: Abdomen is flat.  Musculoskeletal:     Cervical back: Normal range of motion.  Skin:    General: Skin is warm.  Neurological:     Mental Status: She is alert and oriented to person, place, and time.   Review of Systems  Constitutional: Negative.   HENT: Negative.    Eyes: Negative.   Respiratory: Negative.    Cardiovascular: Negative.   Gastrointestinal: Negative.   Genitourinary: Negative.   Musculoskeletal: Negative.   Skin: Negative.   Neurological: Negative.   Endo/Heme/Allergies: Negative.   Psychiatric/Behavioral:  Positive for depression and suicidal ideas. The patient is nervous/anxious.    Blood pressure (!) 153/89, pulse (!) 116, temperature (!) 97.5 F (36.4 C), temperature source Temporal, resp. rate 18, SpO2 98 %. There is no height or weight on file to calculate BMI.  Past Psychiatric History: Patient is being followed by Dr. Clovis Pu for outpatient mental health services and receives vocational rehab services from [facility].  Is the patient at risk to self? Yes  Has the patient been a risk to self in the past 6 months? No .    Has the patient been a risk to self within the distant past? Yes   Is the patient a risk to others? No   Has the patient been a risk to others in the past 6 months? No   Has the patient been a risk to others within the distant past? No   Past Medical History:  Past Medical History:  Diagnosis Date   Allergy    Anxiety    Bipolar depression ([place])    Diabetes mellitus without complication ([place])    Hypertension    No past surgical history on file.  Family History:  Family History  Problem Relation Age of Onset    Arthritis Mother    Hypertension Mother    Heart disease Mother    Cancer Mother        Skin cancer   Hyperlipidemia Mother    Stroke Mother    Arthritis Father        rheumatoid   Hypertension Father    Cancer Father        Skin cancer   Hypertension Sister    Thyroid disease Sister    Mental illness Sister    Stroke Maternal Grandmother    Congestive Heart Failure Maternal Grandfather    Diabetes Maternal Grandfather    Heart disease Maternal Grandfather    Hyperlipidemia Maternal Grandfather    Hypertension Maternal Grandfather    Cancer Paternal Grandmother    Heart disease Paternal Grandfather    Diabetes Paternal Grandfather    Hyperlipidemia Paternal Grandfather     Social History:  Social History   Socioeconomic History   Marital status: Single    Spouse name: N/A   Number of children: 0   Years of education:  Not on file   Highest education level: Not on file  Occupational History   Occupation: cashier-part time    Comment: [employer]    Employer: [employer]   Occupation: Ship broker    Comment: medical office administration [employer]  Tobacco Use   Smoking status: Never   Smokeless tobacco: Never  Vaping Use   Vaping Use: Never used  Substance and Sexual Activity   Alcohol use: No   Drug use: No   Sexual activity: Not Currently    Birth control/protection: Abstinence    Comment: number of sex partners in the last 55 months  0  Other Topics Concern   Not on file  Social History Narrative   Lives alone. Sister lives in [place].   Social Determinants of Health   Financial Resource Strain: Not on file  Food Insecurity: Not on file  Transportation Needs: Not on file  Physical Activity: Not on file  Stress: Not on file  Social Connections: Not on file  Intimate Partner Violence: Not on file    SDOH:  SDOH Screenings   Alcohol Screen: Not on file  Depression (PHQ2-9): Not on file  Financial Resource Strain: Not on file  Food Insecurity: Not on  file  Housing: Not on file  Physical Activity: Not on file  Social Connections: Not on file  Stress: Not on file  Tobacco Use: Low Risk    Smoking Tobacco Use: Never   Smokeless Tobacco Use: Never   Passive Exposure: Not on file  Transportation Needs: Not on file    Last Labs:  Admission on 01/15/2022  Component Date Value Ref Range Status   SARS Coronavirus 2 by RT PCR 01/15/2022 NEGATIVE  NEGATIVE Final   Comment: (NOTE) SARS-CoV-2 target nucleic acids are NOT DETECTED.  The SARS-CoV-2 RNA is generally detectable in upper respiratory specimens during the acute phase of infection. The lowest concentration of SARS-CoV-2 viral copies this assay can detect is 138 copies/mL. A negative result does not preclude SARS-Cov-2 infection and should not be used as the sole basis for treatment or other patient management decisions. A negative result may occur with  improper specimen collection/handling, submission of specimen other than nasopharyngeal swab, presence of viral mutation(s) within the areas targeted by this assay, and inadequate number of viral copies(<138 copies/mL). A negative result must be combined with clinical observations, patient history, and epidemiological information. The expected result is Negative.  Fact Sheet for Patients:  EntrepreneurPulse.com.au  Fact Sheet for Healthcare Providers:  IncredibleEmployment.be  This test is no                          t yet approved or cleared by the [place] FDA and  has been authorized for detection and/or diagnosis of SARS-CoV-2 by FDA under an Emergency Use Authorization (EUA). This EUA will remain  in effect (meaning this test can be used) for the duration of the COVID-19 declaration under Section 564(b)(1) of the Act, 21 U.S.C.section 360bbb-3(b)(1), unless the authorization is terminated  or revoked sooner.       Influenza A by PCR 01/15/2022 NEGATIVE  NEGATIVE Final    Influenza B by PCR 01/15/2022 NEGATIVE  NEGATIVE Final   Comment: (NOTE) The Xpert Xpress SARS-CoV-2/FLU/RSV plus assay is intended as an aid in the diagnosis of influenza from Nasopharyngeal swab specimens and should not be used as a sole basis for treatment. Nasal washings and aspirates are unacceptable for Xpert Xpress SARS-CoV-2/FLU/RSV testing.  Fact Sheet  for Patients: EntrepreneurPulse.com.au  Fact Sheet for Healthcare Providers: IncredibleEmployment.be  This test is not yet approved or cleared by the [place] FDA and has been authorized for detection and/or diagnosis of SARS-CoV-2 by FDA under an Emergency Use Authorization (EUA). This EUA will remain in effect (meaning this test can be used) for the duration of the COVID-19 declaration under Section 564(b)(1) of the Act, 21 U.S.C. section 360bbb-3(b)(1), unless the authorization is terminated or revoked.  Performed at [facility], [place] [address], [place], [place] [address]    WBC 01/15/2022 8.2  4.0 - 10.5 K/uL Final   RBC 01/15/2022 4.85  3.87 - 5.11 MIL/uL Final   Hemoglobin 01/15/2022 14.2  12.0 - 15.0 g/dL Final   HCT 01/15/2022 41.1  36.0 - 46.0 % Final   MCV 01/15/2022 84.7  80.0 - 100.0 fL Final   MCH 01/15/2022 29.3  26.0 - 34.0 pg Final   MCHC 01/15/2022 34.5  30.0 - 36.0 g/dL Final   RDW 01/15/2022 13.8  11.5 - 15.5 % Final   Platelets 01/15/2022 285  150 - 400 K/uL Final   nRBC 01/15/2022 0.0  0.0 - 0.2 % Final   Neutrophils Relative % 01/15/2022 56  % Final   Neutro Abs 01/15/2022 4.6  1.7 - 7.7 K/uL Final   Lymphocytes Relative 01/15/2022 31  % Final   Lymphs Abs 01/15/2022 2.5  0.7 - 4.0 K/uL Final   Monocytes Relative 01/15/2022 10  % Final   Monocytes Absolute 01/15/2022 0.8  0.1 - 1.0 K/uL Final   Eosinophils Relative 01/15/2022 3  % Final   Eosinophils Absolute 01/15/2022 0.2  0.0 - 0.5 K/uL Final   Basophils Relative 01/15/2022 0  % Final    Basophils Absolute 01/15/2022 0.0  0.0 - 0.1 K/uL Final   Immature Granulocytes 01/15/2022 0  % Final   Abs Immature Granulocytes 01/15/2022 0.03  0.00 - 0.07 K/uL Final   Performed at [facility], [place] [address] [place], [place] [address]   Sodium 01/15/2022 131 (L)  135 - 145 mmol/L Final   Potassium 01/15/2022 3.5  3.5 - 5.1 mmol/L Final   Chloride 01/15/2022 95 (L)  98 - 111 mmol/L Final   CO2 01/15/2022 22  22 - 32 mmol/L Final   Glucose, Bld 01/15/2022 296 (H)  70 - 99 mg/dL Final   Glucose reference range applies only to samples taken after fasting for at least 8 hours.   BUN 01/15/2022 13  6 - 20 mg/dL Final   Creatinine, Ser 01/15/2022 0.61  0.44 - 1.00 mg/dL Final   Calcium 01/15/2022 9.8  8.9 - 10.3 mg/dL Final   Total Protein 01/15/2022 7.1  6.5 - 8.1 g/dL Final   Albumin 01/15/2022 4.3  3.5 - 5.0 g/dL Final   AST 01/15/2022 36  15 - 41 U/L Final   ALT 01/15/2022 53 (H)  0 - 44 U/L Final   Alkaline Phosphatase 01/15/2022 84  38 - 126 U/L Final   Total Bilirubin 01/15/2022 0.7  0.3 - 1.2 mg/dL Final   GFR, Estimated 01/15/2022 >60  >60 mL/min Final   Comment: (NOTE) Calculated using the CKD-EPI Creatinine Equation (2021)    Anion gap 01/15/2022 14  5 - 15 Final   Performed at [facility] [address] [place], [place] [address]   TSH 01/15/2022 1.805  0.350 - 4.500 uIU/mL Final   Comment: Performed by a 3rd Generation assay with a functional sensitivity of <=  0.01 uIU/mL. Performed at [facility], [place] [address] [place], [place] [address]    Cholesterol 01/15/2022 247 (H)  0 - 200 mg/dL Final   Triglycerides 01/15/2022 433 (H)  <150 mg/dL Final   HDL 01/15/2022 66  >40 mg/dL Final   Total CHOL/HDL Ratio 01/15/2022 3.7  RATIO Final   VLDL 01/15/2022 UNABLE TO CALCULATE IF TRIGLYCERIDE OVER 400 mg/dL  0 - 40 mg/dL Final   LDL Cholesterol 01/15/2022 UNABLE TO CALCULATE IF TRIGLYCERIDE OVER 400 mg/dL  0 - 99 mg/dL Final   Comment:        Total  Cholesterol/HDL:CHD Risk Coronary Heart Disease Risk Table                     Men   Women  1/2 Average Risk   3.4   3.3  Average Risk       5.0   4.4  2 X Average Risk   9.6   7.1  3 X Average Risk  23.4   11.0        Use the calculated Patient Ratio above and the CHD Risk Table to determine the patient's CHD Risk.        ATP III CLASSIFICATION (LDL):  <100     mg/dL   Optimal  100-129  mg/dL   Near or Above                    Optimal  130-159  mg/dL   Borderline  160-189  mg/dL   High  >190     mg/dL   Very High Performed at [facility] [address] [place], [place] [address]    Lithium Lvl 01/15/2022 <0.06 (L)  0.60 - 1.20 mmol/L Final   Performed at [facility] [address], [place], [place] [address]   SARS Coronavirus 2 Ag 01/15/2022 Negative  Negative Preliminary   POC Amphetamine UR 01/15/2022 None Detected  NONE DETECTED (Cut Off Level 1000 ng/mL) Final   POC Secobarbital (BAR) 01/15/2022 None Detected  NONE DETECTED (Cut Off Level 300 ng/mL) Final   POC Buprenorphine (BUP) 01/15/2022 None Detected  NONE DETECTED (Cut Off Level 10 ng/mL) Final   POC Oxazepam (BZO) 01/15/2022 None Detected  NONE DETECTED (Cut Off Level 300 ng/mL) Final   POC Cocaine UR 01/15/2022 None Detected  NONE DETECTED (Cut Off Level 300 ng/mL) Final   POC Methamphetamine UR 01/15/2022 None Detected  NONE DETECTED (Cut Off Level 1000 ng/mL) Final   POC Morphine 01/15/2022 None Detected  NONE DETECTED (Cut Off Level 300 ng/mL) Final   POC Oxycodone UR 01/15/2022 None Detected  NONE DETECTED (Cut Off Level 100 ng/mL) Final   POC Methadone UR 01/15/2022 None Detected  NONE DETECTED (Cut Off Level 300 ng/mL) Final   POC Marijuana UR 01/15/2022 None Detected  NONE DETECTED (Cut Off Level 50 ng/mL) Final   SARSCOV2ONAVIRUS 2 AG 01/15/2022 NEGATIVE  NEGATIVE Final   Comment: (NOTE) SARS-CoV-2 antigen NOT DETECTED.   Negative results are presumptive.  Negative results do not  preclude SARS-CoV-2 infection and should not be used as the sole basis for treatment or other patient management decisions, including infection  control decisions, particularly in the presence of clinical signs and  symptoms consistent with COVID-19, or in those who have been in contact with the virus.  Negative results must be combined with clinical observations, patient history, and epidemiological information. The expected result is Negative.  Fact Sheet for Patients: HandmadeRecipes.com.cy  Fact Sheet for Healthcare Providers: FuneralLife.at  This test is not yet approved or cleared by the [place] FDA and  has been authorized for detection and/or diagnosis of SARS-CoV-2 by FDA under an Emergency Use Authorization (EUA).  This EUA will remain in effect (meaning this test can be used) for the duration of  the COV                          ID-19 declaration under Section 564(b)(1) of the Act, 21 U.S.C. section 360bbb-3(b)(1), unless the authorization is terminated or revoked sooner.     Direct LDL 01/15/2022 174.3 (H)  0 - 99 mg/dL Final   Performed at [facility] [address] [place], [place] [address]    Allergies: Tomato and Penicillins  PTA Medications: (Not in a hospital admission)   Medical Decision Making  Teala Kolbo is a 51 y/o single female with a history of MDD , schizoaffective d/o and GAD presenting voluntarily at  [facility] for suicidal ideations with a plan to take all of her pills.    Recommendations  Based on my evaluation the patient does not appear to have an emergency medical condition. Recommending patient for inpatient treatment in [place] for crisis stabilization and medication management.   Lucia Bitter, NP 01/16/22  6:56 AM
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
## 2022-01-16 DIAGNOSIS — F411 Generalized anxiety disorder: Secondary | ICD-10-CM | POA: Diagnosis not present

## 2022-01-16 DIAGNOSIS — Z20822 Contact with and (suspected) exposure to covid-19: Secondary | ICD-10-CM | POA: Diagnosis not present

## 2022-01-16 DIAGNOSIS — R45851 Suicidal ideations: Secondary | ICD-10-CM | POA: Diagnosis not present

## 2022-01-16 DIAGNOSIS — F251 Schizoaffective disorder, depressive type: Secondary | ICD-10-CM | POA: Diagnosis not present

## 2022-01-16 LAB — LDL CHOLESTEROL, DIRECT: Direct LDL: 174.3 mg/dL — ABNORMAL HIGH (ref 0–99)

## 2022-01-16 MED ORDER — METFORMIN HCL 500 MG PO TABS
1000.0000 mg | ORAL_TABLET | Freq: Two times a day (BID) | ORAL | Status: DC
  Administered 2022-01-16 – 2022-01-19 (×7): 1000 mg via ORAL
  Filled 2022-01-16 (×7): qty 2

## 2022-01-16 MED ORDER — GLIPIZIDE 5 MG PO TABS
5.0000 mg | ORAL_TABLET | Freq: Two times a day (BID) | ORAL | Status: DC
  Administered 2022-01-16 – 2022-01-19 (×7): 5 mg via ORAL
  Filled 2022-01-16 (×7): qty 1

## 2022-01-16 MED ORDER — METOPROLOL SUCCINATE ER 50 MG PO TB24
50.0000 mg | ORAL_TABLET | Freq: Every evening | ORAL | Status: DC
  Administered 2022-01-16 – 2022-01-18 (×3): 50 mg via ORAL
  Filled 2022-01-16 (×3): qty 1

## 2022-01-16 MED ORDER — RISPERIDONE 2 MG PO TABS
4.0000 mg | ORAL_TABLET | Freq: Every evening | ORAL | Status: DC
  Administered 2022-01-16: 4 mg via ORAL
  Filled 2022-01-16: qty 2

## 2022-01-16 MED ORDER — LORATADINE 10 MG PO TABS
10.0000 mg | ORAL_TABLET | Freq: Every day | ORAL | Status: DC
  Administered 2022-01-16 – 2022-01-19 (×4): 10 mg via ORAL
  Filled 2022-01-16 (×4): qty 1

## 2022-01-16 MED ORDER — TRIHEXYPHENIDYL HCL 2 MG PO TABS
2.0000 mg | ORAL_TABLET | Freq: Two times a day (BID) | ORAL | Status: DC
  Administered 2022-01-16 – 2022-01-17 (×3): 2 mg via ORAL
  Filled 2022-01-16 (×3): qty 1

## 2022-01-16 MED ORDER — LITHIUM CARBONATE 300 MG PO CAPS
300.0000 mg | ORAL_CAPSULE | Freq: Every day | ORAL | Status: DC
  Administered 2022-01-16 – 2022-01-19 (×4): 300 mg via ORAL
  Filled 2022-01-16: qty 1
  Filled 2022-01-16: qty 7
  Filled 2022-01-16 (×3): qty 1

## 2022-01-16 MED ORDER — B COMPLEX-C PO TABS
1.0000 | ORAL_TABLET | Freq: Every day | ORAL | Status: DC
  Administered 2022-01-16 – 2022-01-19 (×4): 1 via ORAL
  Filled 2022-01-16 (×4): qty 1

## 2022-01-16 MED ORDER — ATORVASTATIN CALCIUM 10 MG PO TABS
10.0000 mg | ORAL_TABLET | Freq: Every day | ORAL | Status: DC
  Administered 2022-01-16 – 2022-01-19 (×4): 10 mg via ORAL
  Filled 2022-01-16 (×4): qty 1

## 2022-01-16 MED ORDER — LISINOPRIL 20 MG PO TABS
20.0000 mg | ORAL_TABLET | Freq: Every day | ORAL | Status: DC
  Administered 2022-01-16 – 2022-01-19 (×4): 20 mg via ORAL
  Filled 2022-01-16 (×4): qty 1

## 2022-01-16 MED ORDER — HYDROCHLOROTHIAZIDE 25 MG PO TABS
25.0000 mg | ORAL_TABLET | Freq: Every day | ORAL | Status: DC
  Administered 2022-01-16 – 2022-01-19 (×4): 25 mg via ORAL
  Filled 2022-01-16 (×4): qty 1

## 2022-01-16 MED ORDER — ADULT MULTIVITAMIN W/MINERALS CH
1.0000 | ORAL_TABLET | Freq: Every day | ORAL | Status: DC
  Administered 2022-01-16 – 2022-01-19 (×4): 1 via ORAL
  Filled 2022-01-16 (×4): qty 1

## 2022-01-16 MED ORDER — VITAMIN E 180 MG (400 UNIT) PO CAPS
400.0000 [IU] | ORAL_CAPSULE | Freq: Every day | ORAL | Status: DC
  Administered 2022-01-18: 400 [IU] via ORAL
  Filled 2022-01-16 (×5): qty 1

## 2022-01-16 MED ORDER — PAROXETINE HCL 20 MG PO TABS
40.0000 mg | ORAL_TABLET | Freq: Every day | ORAL | Status: DC
  Administered 2022-01-16 – 2022-01-19 (×4): 40 mg via ORAL
  Filled 2022-01-16 (×3): qty 2
  Filled 2022-01-16: qty 14
  Filled 2022-01-16: qty 2

## 2022-01-16 NOTE — ED Notes (Signed)
Pt is currently sleeping, no distress noted, environmental check complete, will continue to monitor patient for safety. ? ?

## 2022-01-16 NOTE — ED Notes (Signed)
Patient remains asleep in bed without issue or complaint.  Will monitor and provide a safe environment for patient.   ?

## 2022-01-16 NOTE — ED Notes (Signed)
Pt has been brought over to the [facility] unit, familiarized with the unit and is now in bed sleep ?

## 2022-01-16 NOTE — ED Provider Notes (Addendum)
Behavioral Health Progress Note  Date and Time: 01/16/2022 10:52 AM Name: Elizabeth [name] MRN:  [id number]  Subjective:  Elizabeth [name] reported " I am feeling better today, I just keep having crying spells."   Evaluation: Elizabeth [name] was seen and evaluated face-to-face.  She presents with a bright and pleasant affect.  Stating ongoing thoughts to overdose due to mood irritability/lability.  Currently she is denying suicidal or homicidal ideations.  Denies auditory visual hallucinations.  States her primary care provider has been adjusting her medications since she was discontinued from [place] which she felt was helpful in the past however due to affordability she has not been able to take this medication for the past 4 months.  She reported previous suicide attempt, " not quite to that extent, just do not feel like myself."  Reported previous inpatient admissions. Denied illicit drug use or substance abuse history.  Per admission assessment note: "Elizabeth [name] is a 51 y/o single female with a history of MDD , schizoaffective d/o and GAD presenting voluntarily at  [facility] for suicidal ideations with a plan to take all of her pills."   Diagnosis:  Final diagnoses:  Schizoaffective disorder, depressive type (HCC)  GAD (generalized anxiety disorder)    Total Time spent with patient: 15 minutes  Past Psychiatric History:  Past Medical History:  Past Medical History:  Diagnosis Date   Allergy    Anxiety    Bipolar depression (HCC)    Diabetes mellitus without complication (HCC)    Hypertension    No past surgical history on file. Family History:  Family History  Problem Relation Age of Onset   Arthritis Mother    Hypertension Mother    Heart disease Mother    Cancer Mother        Skin cancer   Hyperlipidemia Mother    Stroke Mother    Arthritis Father        rheumatoid   Hypertension Father    Cancer Father        Skin cancer   Hypertension Sister    Thyroid disease Sister    Mental  illness Sister    Stroke Maternal Grandmother    Congestive Heart Failure Maternal Grandfather    Diabetes Maternal Grandfather    Heart disease Maternal Grandfather    Hyperlipidemia Maternal Grandfather    Hypertension Maternal Grandfather    Cancer Paternal Grandmother    Heart disease Paternal Grandfather    Diabetes Paternal Grandfather    Hyperlipidemia Paternal Actor    Family Psychiatric  History:  Social History:  Social History   Substance and Sexual Activity  Alcohol Use No     Social History   Substance and Sexual Activity  Drug Use No    Social History   Socioeconomic History   Marital status: Single    Spouse name: N/A   Number of children: 0   Years of education: Not on file   Highest education level: Not on file  Occupational History   Occupation: cashier-part time    Comment: [employer]    Employer: [employer]   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 Sister lives in [place].   Social Determinants of Health   Financial Resource Strain: Not on file  Food Insecurity: Not on file  Transportation Needs: Not on file  Physical Activity: Not on file  Stress: Not on file  Social Connections: Not on file   SDOH:  SDOH Screenings   Alcohol Screen: Not on file  Depression (PHQ2-9): Not on file  Financial Resource Strain: Not on file  Food Insecurity: Not on file  Housing: Not on file  Physical Activity: Not on file  Social Connections: Not on file  Stress: Not on file  Tobacco Use: Low Risk    Smoking Tobacco Use: Never   Smokeless  Tobacco Use: Never   Passive Exposure: Not on file  Transportation Needs: Not on file   Additional Social History:    Pain Medications: Denies abuse Prescriptions: Denies abuse Over the Counter: Denies abuse History of alcohol / drug use?: No history of alcohol / drug abuse Longest period of sobriety (when/how long): NA                    Sleep: Fair  Appetite:  Fair  Current Medications:  Current Facility-Administered Medications  Medication Dose Route Frequency Provider Last Rate Last Admin   acetaminophen (TYLENOL) tablet 650 mg  650 mg Oral Q6H PRN Bobbitt, Shalon E, NP       alum & mag hydroxide-simeth (MAALOX/MYLANTA) 200-200-20 MG/5ML suspension 30 mL  30 mL Oral Q4H PRN Bobbitt, Shalon E, NP       atorvastatin (LIPITOR) tablet 10 mg  10 mg Oral Daily Bobbitt, Shalon E, NP   10 mg at 01/16/22 6213   B-complex with vitamin C tablet 1 tablet  1 tablet Oral Daily Bobbitt, Shalon E, NP   1 tablet at 01/16/22 0923   glipiZIDE (GLUCOTROL) tablet 5 mg  5 mg Oral BID AC Bobbitt, Shalon E, NP   5 mg at 01/16/22 0865   hydrochlorothiazide (HYDRODIURIL) tablet 25 mg  25 mg Oral Daily Bobbitt, Shalon E, NP   25 mg at 01/16/22 7846   hydrOXYzine (ATARAX) tablet 25 mg  25 mg Oral TID PRN Bobbitt, Shalon E, NP       lisinopril (ZESTRIL) tablet 20 mg  20 mg Oral Daily Bobbitt, Shalon E, NP   20 mg at 01/16/22 9629   lithium carbonate capsule 300 mg  300 mg Oral Daily Bobbitt, Shalon E, NP   300 mg at 01/16/22 0922   loratadine (CLARITIN) tablet 10 mg  10 mg Oral Daily Bobbitt, Shalon E, NP   10 mg at 01/16/22 5284   magnesium hydroxide (MILK OF MAGNESIA) suspension 30 mL  30 mL Oral Daily PRN Bobbitt, Shalon E, NP       metFORMIN (GLUCOPHAGE) tablet 1,000 mg  1,000 mg Oral BID WC Bobbitt, Shalon E, NP   1,000 mg at 01/16/22 0921   metoprolol succinate (TOPROL-XL) 24 hr tablet 50 mg  50 mg Oral QPM Bobbitt, Shalon E, NP       multivitamin with minerals tablet 1 tablet  1 tablet Oral  Daily Bobbitt, Shalon E, NP   1 tablet at 01/16/22 0921   PARoxetine (PAXIL) tablet 40 mg  40 mg Oral Daily Bobbitt, Shalon E, NP   40 mg at 01/16/22 0921   risperiDONE (RISPERDAL) tablet 4 mg  4 mg Oral QPM Bobbitt,  Shalon E, NP       trihexyphenidyl (ARTANE) tablet 2 mg  2 mg Oral BID WC Bobbitt, Shalon E, NP   2 mg at 01/16/22 40980922   vitamin E capsule 400 Units  400 Units Oral Daily Bobbitt, Shalon E, NP       Current Outpatient Medications  Medication Sig Dispense Refill   albuterol (VENTOLIN HFA) 108 (90 Base) MCG/ACT inhaler Inhale into the lungs.     atorvastatin (LIPITOR) 10 MG tablet Take 1 tablet (10 mg total) by mouth daily. 90 tablet 1   B Complex-C (B-COMPLEX WITH VITAMIN C) tablet Take 1 tablet by mouth daily.     Cholecalciferol (VITAMIN D3) 25 MCG (1000 UT) CAPS Take by mouth.     Ginkgo Biloba (GINKOBA PO) Take 1 tablet by mouth 2 (two) times daily. (Patient not taking: Reported on 12/15/2021)     glipiZIDE (GLUCOTROL) 5 MG tablet Take by mouth 2 (two) times daily before a meal.     glucose blood test strip Use as instructed 100 each 3   hydrochlorothiazide (HYDRODIURIL) 25 MG tablet Take 25 mg by mouth daily.     lisinopril (ZESTRIL) 10 MG tablet Take 20 mg by mouth daily.      lithium carbonate 300 MG capsule Take 1 capsule (300 mg total) by mouth daily. 90 capsule 1   loratadine (CLARITIN) 10 MG tablet Take 1 tablet (10 mg total) by mouth daily. 14 tablet 0   LORazepam (ATIVAN) 0.5 MG tablet Take 1-2 tablets (0.5-1 mg total) by mouth daily as needed for anxiety (as needed for anxiety before work). 30 tablet 1   metFORMIN (GLUCOPHAGE) 1000 MG tablet TAKE 1 TABLET BY MOUTH TWICE DAILY BEFORE MEAL(S)     metoprolol succinate (TOPROL-XL) 50 MG 24 hr tablet Take 1 tablet (50 mg total) by mouth every evening. 90 tablet 1   Multiple Vitamin (MULTIVITAMIN WITH MINERALS) TABS tablet Take 1 tablet by mouth daily.     Omega-3 Fatty Acids (FISH OIL) 1000 MG CPDR Take by mouth.      PARoxetine (PAXIL) 20 MG tablet Take 2 tablets (40 mg total) by mouth daily. 90 tablet 1   risperidone (RISPERDAL) 4 MG tablet Take 1.5 tablets (6 mg total) by mouth every evening. 45 tablet 0   sodium chloride (OCEAN) 0.65 % SOLN nasal spray Place 1 spray into both nostrils as needed for congestion.     trihexyphenidyl (ARTANE) 2 MG tablet Take 1 tablet (2 mg total) by mouth 2 (two) times daily with a meal. 180 tablet 1   vitamin E 400 UNIT capsule Take 400 Units by mouth daily.      Labs  Lab Results:  Admission on 01/15/2022  Component Date Value Ref Range Status   SARS Coronavirus 2 by RT PCR 01/15/2022 NEGATIVE  NEGATIVE Final   Comment: (NOTE) SARS-CoV-2 target nucleic acids are NOT DETECTED.  The SARS-CoV-2 RNA is generally detectable in upper respiratory specimens during the acute phase of infection. The lowest concentration of SARS-CoV-2 viral copies this assay can detect is 138 copies/mL. A negative result does not preclude SARS-Cov-2 infection and should not be used as the sole basis for treatment or other patient management decisions. A negative result may occur with  improper specimen collection/handling, submission of specimen other than nasopharyngeal swab, presence of viral mutation(s) within the areas targeted by this assay, and inadequate number of viral copies(<138 copies/mL). A negative result must be combined with clinical observations, patient history, and epidemiological  information. The expected result is Negative.  Fact Sheet for Patients:  BloggerCourse.com  Fact Sheet for Healthcare Providers:  SeriousBroker.it  This test is no                          t yet approved or cleared by the [place] FDA and  has been authorized for detection and/or diagnosis of SARS-CoV-2 by FDA under an Emergency Use Authorization (EUA). This EUA will remain  in effect (meaning this test can be used) for the duration of  the COVID-19 declaration under Section 564(b)(1) of the Act, 21 U.S.C.section 360bbb-3(b)(1), unless the authorization is terminated  or revoked sooner.       Influenza A by PCR 01/15/2022 NEGATIVE  NEGATIVE Final   Influenza B by PCR 01/15/2022 NEGATIVE  NEGATIVE Final   Comment: (NOTE) The Xpert Xpress SARS-CoV-2/FLU/RSV plus assay is intended as an aid in the diagnosis of influenza from Nasopharyngeal swab specimens and should not be used as a sole basis for treatment. Nasal washings and aspirates are unacceptable for Xpert Xpress SARS-CoV-2/FLU/RSV testing.  Fact Sheet for Patients: BloggerCourse.com  Fact Sheet for Healthcare Providers: SeriousBroker.it  This test is not yet approved or cleared by the [place] FDA and has been authorized for detection and/or diagnosis of SARS-CoV-2 by FDA under an Emergency Use Authorization (EUA). This EUA will remain in effect (meaning this test can be used) for the duration of the COVID-19 declaration under Section 564(b)(1) of the Act, 21 U.S.C. section 360bbb-3(b)(1), unless the authorization is terminated or revoked.  Performed at [facility], [address], [place], [place] [address]    WBC 01/15/2022 8.2  4.0 - 10.5 K/uL Final   RBC 01/15/2022 4.85  3.87 - 5.11 MIL/uL Final   Hemoglobin 01/15/2022 14.2  12.0 - 15.0 g/dL Final   HCT 60/45/4098 41.1  36.0 - 46.0 % Final   MCV 01/15/2022 84.7  80.0 - 100.0 fL Final   MCH 01/15/2022 29.3  26.0 - 34.0 pg Final   MCHC 01/15/2022 34.5  30.0 - 36.0 g/dL Final   RDW 11/91/4782 13.8  11.5 - 15.5 % Final   Platelets 01/15/2022 285  150 - 400 K/uL Final   nRBC 01/15/2022 0.0  0.0 - 0.2 % Final   Neutrophils Relative % 01/15/2022 56  % Final   Neutro Abs 01/15/2022 4.6  1.7 - 7.7 K/uL Final   Lymphocytes Relative 01/15/2022 31  % Final   Lymphs Abs 01/15/2022 2.5  0.7 - 4.0 K/uL Final   Monocytes Relative 01/15/2022 10  %  Final   Monocytes Absolute 01/15/2022 0.8  0.1 - 1.0 K/uL Final   Eosinophils Relative 01/15/2022 3  % Final   Eosinophils Absolute 01/15/2022 0.2  0.0 - 0.5 K/uL Final   Basophils Relative 01/15/2022 0  % Final   Basophils Absolute 01/15/2022 0.0  0.0 - 0.1 K/uL Final   Immature Granulocytes 01/15/2022 0  % Final   Abs Immature Granulocytes 01/15/2022 0.03  0.00 - 0.07 K/uL Final   Performed at [facility], [address], [place], [place] [address]   Sodium 01/15/2022 131 (L)  135 - 145 mmol/L Final   Potassium 01/15/2022 3.5  3.5 - 5.1 mmol/L Final   Chloride 01/15/2022 95 (L)  98 - 111 mmol/L Final   CO2 01/15/2022 22  22 - 32 mmol/L Final   Glucose, Bld 01/15/2022 296 (H)  70 - 99 mg/dL Final  Glucose reference range applies only to samples taken after fasting for at least 8 hours.   BUN 01/15/2022 13  6 - 20 mg/dL Final   Creatinine, Ser 01/15/2022 0.61  0.44 - 1.00 mg/dL Final   Calcium 96/02/5408 9.8  8.9 - 10.3 mg/dL Final   Total Protein 81/19/1478 7.1  6.5 - 8.1 g/dL Final   Albumin 29/56/2130 4.3  3.5 - 5.0 g/dL Final   AST 86/57/8469 36  15 - 41 U/L Final   ALT 01/15/2022 53 (H)  0 - 44 U/L Final   Alkaline Phosphatase 01/15/2022 84  38 - 126 U/L Final   Total Bilirubin 01/15/2022 0.7  0.3 - 1.2 mg/dL Final   GFR, Estimated 01/15/2022 >60  >60 mL/min Final   Comment: (NOTE) Calculated using the CKD-EPI Creatinine Equation (2021)    Anion gap 01/15/2022 14  5 - 15 Final   Performed at [facility], [address], [place], [place] [address]   TSH 01/15/2022 1.805  0.350 - 4.500 uIU/mL Final   Comment: Performed by a 3rd Generation assay with a functional sensitivity of <=0.01 uIU/mL. Performed at [facility], [address], [place], [place] [address]    Cholesterol 01/15/2022 247 (H)  0 - 200 mg/dL Final   Triglycerides 44/11/270 433 (H)  <150 mg/dL Final   HDL 53/66/4403 66  >40 mg/dL Final   Total CHOL/HDL Ratio 01/15/2022 3.7  RATIO Final    VLDL 01/15/2022 UNABLE TO CALCULATE IF TRIGLYCERIDE OVER 400 mg/dL  0 - 40 mg/dL Final   LDL Cholesterol 01/15/2022 UNABLE TO CALCULATE IF TRIGLYCERIDE OVER 400 mg/dL  0 - 99 mg/dL Final   Comment:        Total Cholesterol/HDL:CHD Risk Coronary Heart Disease Risk Table                     Men   Women  1/2 Average Risk   3.4   3.3  Average Risk       5.0   4.4  2 X Average Risk   9.6   7.1  3 X Average Risk  23.4   11.0        Use the calculated Patient Ratio above and the CHD Risk Table to determine the patient's CHD Risk.        ATP III CLASSIFICATION (LDL):  <100     mg/dL   Optimal  474-259  mg/dL   Near or Above                    Optimal  130-159  mg/dL   Borderline  563-875  mg/dL   High  >643     mg/dL   Very High Performed at [facility], [address] [place], [place] [address]    Lithium Lvl 01/15/2022 <0.06 (L)  0.60 - 1.20 mmol/L Final   Performed at [facility], [address] [place], [place] [address]   SARS Coronavirus 2 Ag 01/15/2022 Negative  Negative Preliminary   POC Amphetamine UR 01/15/2022 None Detected  NONE DETECTED (Cut Off Level 1000 ng/mL) Final   POC Secobarbital (BAR) 01/15/2022 None Detected  NONE DETECTED (Cut Off Level 300 ng/mL) Final   POC Buprenorphine (BUP) 01/15/2022 None Detected  NONE DETECTED (Cut Off Level 10 ng/mL) Final   POC Oxazepam (BZO) 01/15/2022 None Detected  NONE DETECTED (Cut Off Level 300 ng/mL) Final   POC Cocaine UR 01/15/2022 None  Detected  NONE DETECTED (Cut Off Level 300 ng/mL) Final   POC Methamphetamine UR 01/15/2022 None Detected  NONE DETECTED (Cut Off Level 1000 ng/mL) Final   POC Morphine 01/15/2022 None Detected  NONE DETECTED (Cut Off Level 300 ng/mL) Final   POC Oxycodone UR 01/15/2022 None Detected  NONE DETECTED (Cut Off Level 100 ng/mL) Final   POC Methadone UR 01/15/2022 None Detected  NONE DETECTED (Cut Off Level 300 ng/mL) Final   POC Marijuana UR 01/15/2022 None Detected  NONE DETECTED (Cut  Off Level 50 ng/mL) Final   SARSCOV2ONAVIRUS 2 AG 01/15/2022 NEGATIVE  NEGATIVE Final   Comment: (NOTE) SARS-CoV-2 antigen NOT DETECTED.   Negative results are presumptive.  Negative results do not preclude SARS-CoV-2 infection and should not be used as the sole basis for treatment or other patient management decisions, including infection  control decisions, particularly in the presence of clinical signs and  symptoms consistent with COVID-19, or in those who have been in contact with the virus.  Negative results must be combined with clinical observations, patient history, and epidemiological information. The expected result is Negative.  Fact Sheet for Patients: https://www.jennings-kim.com/  Fact Sheet for Healthcare Providers: https://alexander-rogers.biz/  This test is not yet approved or cleared by the [place] FDA and  has been authorized for detection and/or diagnosis of SARS-CoV-2 by FDA under an Emergency Use Authorization (EUA).  This EUA will remain in effect (meaning this test can be used) for the duration of  the COV                          ID-19 declaration under Section 564(b)(1) of the Act, 21 U.S.C. section 360bbb-3(b)(1), unless the authorization is terminated or revoked sooner.     Direct LDL 01/15/2022 174.3 (H)  0 - 99 mg/dL Final   Performed at [facility], [address] [place], [place] [address]    Blood Alcohol level:  No results found for: [facility]  Metabolic Disorder Labs: Lab Results  Component Value Date   HGBA1C 7.8 10/23/2017   MPG 140 09/22/2009   No results found for: PROLACTIN Lab Results  Component Value Date   CHOL 247 (H) 01/15/2022   TRIG 433 (H) 01/15/2022   HDL 66 01/15/2022   CHOLHDL 3.7 01/15/2022   VLDL UNABLE TO CALCULATE IF TRIGLYCERIDE OVER 400 mg/dL 78/93/8101   LDLCALC UNABLE TO CALCULATE IF TRIGLYCERIDE OVER 400 mg/dL 75/08/2584   LDLCALC 73 10/23/2017    Therapeutic Lab  Levels: Lab Results  Component Value Date   LITHIUM <0.06 (L) 01/15/2022   No results found for: VALPROATE No components found for:  CBMZ  Physical Findings   GAD-7    Flowsheet Row Office Visit from 10/23/2017 in Primary Care at [facility]  Total GAD-7 Score 7      PHQ2-9    Flowsheet Row Office Visit from 10/23/2017 in Primary Care at [facility] Visit from 05/27/2017 in Primary Care at [facility] Visit from 05/17/2017 in Primary Care at [facility] Visit from 01/18/2017 in Primary Care at [facility] Visit from 11/09/2016 in Primary Care at [facility] Total Score 3 0 0 0 0  PHQ-9 Total Score 13 -- -- -- --      Flowsheet Row ED from 01/15/2022 in [facility]  C-SSRS RISK CATEGORY Low Risk        Musculoskeletal  Strength & Muscle Tone: within normal limits Gait & Station:  normal Patient leans: N/A  Psychiatric Specialty Exam  Presentation  General Appearance: Casual  Eye Contact:Good  Speech:Clear and Coherent  Speech Volume:Normal  Handedness:Right   Mood and Affect  Mood:Depressed; Hopeless  Affect:Depressed   Thought Process  Thought Processes:Goal Directed  Descriptions of Associations:Intact  Orientation:Full (Time, Place and Person)  Thought Content:WDL  Diagnosis of Schizophrenia or Schizoaffective disorder in past: No    Hallucinations:Hallucinations: None  Ideas of Reference:None  Suicidal Thoughts:Suicidal Thoughts: Yes, Active SI Active Intent and/or Plan: With Intent; With Plan  Homicidal Thoughts:Homicidal Thoughts: No   Sensorium  Memory:Immediate Good; Recent Good; Remote Good  Judgment:Fair  Insight:Good   Executive Functions  Concentration:Good  Attention Span:Fair  Recall:Fair  Fund of Knowledge:Good  Language:Good   Psychomotor Activity  Psychomotor Activity:Psychomotor Activity: Extrapyramidal Side Effects (EPS) 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)   Assets  Assets:Communication Skills; Desire for Improvement; Housing; Health and safety inspector; Physical Health; Resilience   Sleep  Sleep:Number of Hours of Sleep: -1   Nutritional Assessment (For OBS and FBC admissions only) Has the patient had a weight loss or gain of 10 pounds or more in the last 3 months?: No Has the patient had a decrease in food intake/or appetite?: No Does the patient have dental problems?: No Does the patient have eating habits or behaviors that may be indicators of an eating disorder including binging or inducing vomiting?: No Has the patient recently lost weight without trying?: 0 Has the patient been eating poorly because of a decreased appetite?: 0 Malnutrition Screening Tool Score: 0    Physical Exam  Physical Exam Vitals and nursing note reviewed.  Cardiovascular:     Rate and Rhythm: Normal rate and regular rhythm.  Neurological:     Mental Status: She is alert and oriented to person, place, and time.  Psychiatric:        Attention and Perception: Attention normal.        Mood and Affect: Mood normal.        Speech: Speech normal.        Behavior: Behavior normal.        Thought Content: Thought content normal.        Cognition and Memory: Cognition normal.        Judgment: Judgment normal.   Review of Systems  HENT: Negative.    Eyes: Negative.   Cardiovascular: Negative.   Genitourinary: Negative.   Musculoskeletal: Negative.   Skin: Negative.   Psychiatric/Behavioral:  Positive for depression and suicidal ideas. The patient is nervous/anxious.   All other systems reviewed and are negative. Blood pressure (!) 153/89, pulse (!) 116, temperature (!) 97.5 F (36.4 C), temperature source Temporal, resp. rate 18, SpO2 98 %. There is no height or weight on file to calculate BMI.  Treatment Plan Summary: Daily contact with patient to assess and evaluate symptoms and progress in treatment and Medication  management  Major depressive disorder Schizoaffective disorder Generalized anxiety disorder  Continue Risperdal 4 mg p.o. nightly Continue Artane 2 mg p.o. twice daily Continue lithium 300 mg daily- valproic level <0.06 Continue Paxil 40 mg daily  Hypertension: Continue metoprolol 50 mg daily Continue lisinopril 20 mg daily Continue hydrochlorothiazide 25 mg daily  Lipitor 10 mg daily Glipizide 5 mg p.o. p.o. twice daily   CSW to continue working on discharge disposition Patient encouraged to participate within the therapeutic milieu   Oneta Rack, NP 01/16/2022 10:52 AM

## 2022-01-16 NOTE — ED Notes (Signed)
Seen an order for a USD I went and collected the pee from the Pt. And I went to enter the results in and its not there to put in the results. Everything was NEG. Not sure if the order just wasn't checked off or something but I got it anyway.  ?

## 2022-01-16 NOTE — ED Notes (Signed)
RN is in the room speaking to Pt. ?

## 2022-01-16 NOTE — Progress Notes (Signed)
Patient awake and alert.  Took meds without issue.  Calm and cooperative.  Minimally verbal.  Denies avh shi or plan.  Will monitor.   ?

## 2022-01-16 NOTE — ED Notes (Signed)
Pt is in the bed sleeping. Respirations are even and unlabored. No acute distress noted. Will continue to monitor for safety. 

## 2022-01-16 NOTE — Progress Notes (Signed)
Patient has been calm and quiet on unit spending most of her time in her room.  She ate lunch and has been without complaint or distress.  Denies avh shi or plan.  Will monitor.   ?

## 2022-01-17 DIAGNOSIS — F411 Generalized anxiety disorder: Secondary | ICD-10-CM | POA: Diagnosis not present

## 2022-01-17 DIAGNOSIS — Z20822 Contact with and (suspected) exposure to covid-19: Secondary | ICD-10-CM | POA: Diagnosis not present

## 2022-01-17 DIAGNOSIS — R45851 Suicidal ideations: Secondary | ICD-10-CM | POA: Diagnosis not present

## 2022-01-17 DIAGNOSIS — F251 Schizoaffective disorder, depressive type: Secondary | ICD-10-CM | POA: Diagnosis not present

## 2022-01-17 LAB — HEMOGLOBIN A1C
Hgb A1c MFr Bld: 10.1 % — ABNORMAL HIGH (ref 4.8–5.6)
Mean Plasma Glucose: 243 mg/dL

## 2022-01-17 MED ORDER — TRIHEXYPHENIDYL HCL 2 MG PO TABS
2.0000 mg | ORAL_TABLET | Freq: Every morning | ORAL | Status: DC
  Administered 2022-01-18 – 2022-01-19 (×2): 2 mg via ORAL
  Filled 2022-01-17: qty 7
  Filled 2022-01-17 (×2): qty 1

## 2022-01-17 MED ORDER — RISPERIDONE 3 MG PO TABS
6.0000 mg | ORAL_TABLET | Freq: Every evening | ORAL | Status: DC
Start: 2022-01-17 — End: 2022-01-19
  Administered 2022-01-17 – 2022-01-18 (×2): 6 mg via ORAL
  Filled 2022-01-17 (×2): qty 2
  Filled 2022-01-17: qty 14

## 2022-01-17 NOTE — ED Notes (Signed)
Patient awake and alert on unit.  Reports "feeling a little better today."  Patient is calm cooperative and pleasant on approach.  She is soft spoken organized and logical with sad affect.  Presently she denies avh shi or plan.  Will monitor and provide a safe environment.   ?

## 2022-01-17 NOTE — ED Notes (Signed)
Pt is currently sleeping, no distress noted, environmental check complete, will continue to monitor patient for safety. ? ?

## 2022-01-17 NOTE — Telephone Encounter (Signed)
Called patient, immediately went to VM. LVM to [facility] but switchboard closed. Will call again before I leave for the day.  ?

## 2022-01-17 NOTE — ED Notes (Signed)
Pt is in the bed sleeping. Respirations are even and unlabored. No acute distress noted. Will continue to monitor for safety. 
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
## 2022-01-17 NOTE — Telephone Encounter (Signed)
Elizabeth [name] please call to check on the patient.  It looks like she was sent to the ER over the weekend due to suicidal thoughts. ?

## 2022-01-17 NOTE — ED Notes (Signed)
PT is in her room sleeping, respirations are even and unlabored, no distress noted, will continue to monitor patient for safety ?

## 2022-01-17 NOTE — Progress Notes (Signed)
Patient attended group and participated well.  Discussed establishing healthy boundaries with father and her friend.  She is now preparing to eat lunch.   ?

## 2022-01-17 NOTE — ED Provider Notes (Signed)
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 Pending improvement if symptomatology  Ival Bible, MD 01/17/2022 4:07 PM

## 2022-01-17 NOTE — Group Note (Signed)
Group Topic: Decisional Balance/Substance Abuse  ?Group Date: 01/17/2022 ?Start Time: 1030 ?End Time: 1130 ?Facilitators: Laury Axon E  ?Department: [facility] ? ?Number of Participants: 4  ?Group Focus: chemical dependency education, coping skills, and substance abuse education ?Treatment Modality:  Solution-Focused Therapy ?Interventions utilized were patient education ?Purpose: enhance coping skills, regain self-worth, reinforce self-care, and relapse prevention strategies ? ?Name: Elizabeth [name] Date of Birth: 08-Jan-1971  ?MR: [id number]   ? ?Level of Participation: active ?Quality of Participation: attentive and cooperative ?Interactions with others: gave feedback ?Mood/Affect: appropriate ?Triggers (if applicable): n/a ?Cognition: coherent/clear ?Progress: Moderate ?Response: n/a ?Plan: follow-up needed ? ?Patients Problems:  ?Patient Active Problem List  ? Diagnosis Date Noted  ? Schizoaffective disorder, depressive type ([place]) 01/15/2022  ? Controlled type 2 diabetes mellitus without complication, without long-term current use of insulin ([place]) 10/23/2017  ? Tachycardia 08/23/2016  ? Environmental allergies 07/25/2014  ? Headache, common migraine (occasionally w/ aura) 04/25/2013  ? HTN (hypertension) 09/26/2012  ? Overweight (BMI 25.0-29.9) 09/26/2012  ? Metabolic syndrome [id number]  ? Bipolar depression ([place])   ?  ?

## 2022-01-18 DIAGNOSIS — F411 Generalized anxiety disorder: Secondary | ICD-10-CM | POA: Diagnosis not present

## 2022-01-18 DIAGNOSIS — F251 Schizoaffective disorder, depressive type: Secondary | ICD-10-CM | POA: Diagnosis not present

## 2022-01-18 DIAGNOSIS — R45851 Suicidal ideations: Secondary | ICD-10-CM | POA: Diagnosis not present

## 2022-01-18 DIAGNOSIS — Z20822 Contact with and (suspected) exposure to covid-19: Secondary | ICD-10-CM | POA: Diagnosis not present

## 2022-01-18 MED ORDER — DM-GUAIFENESIN ER 30-600 MG PO TB12
1.0000 | ORAL_TABLET | Freq: Two times a day (BID) | ORAL | Status: DC
  Administered 2022-01-18 – 2022-01-19 (×3): 1 via ORAL
  Filled 2022-01-18: qty 14
  Filled 2022-01-18 (×3): qty 1

## 2022-01-18 MED ORDER — LIVING WELL WITH DIABETES BOOK
Freq: Once | Status: AC
  Filled 2022-01-18: qty 1

## 2022-01-18 MED ORDER — VITAMIN E 180 MG (400 UNIT) PO CAPS
400.0000 [IU] | ORAL_CAPSULE | Freq: Every day | ORAL | Status: DC
  Administered 2022-01-19: 400 [IU] via ORAL
  Filled 2022-01-18 (×2): qty 1
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
## 2022-01-18 NOTE — ED Notes (Signed)
Pt is currently sleeping, no distress noted, environmental check complete, will continue to monitor patient for safety. ? ?

## 2022-01-18 NOTE — ED Notes (Signed)
Patient is calm and cooperative with care.  She is pleasant and without complaint.  Mood sad but improved.  Affect constricted thought process simple but organized.  Patient denies avh ahi or plan.  Will monitor and provide safe environment.   ?

## 2022-01-18 NOTE — ED Notes (Signed)
Patient declined to attend AA and wrap up group, even after encouragement from staff. ?

## 2022-01-18 NOTE — Progress Notes (Signed)
Inpatient Diabetes Program Recommendations ? ?AACE/ADA: New Consensus Statement on Inpatient Glycemic Control (2015) ? ?Target Ranges:  Prepandial:   less than 140 mg/dL ?     Peak postprandial:   less than 180 mg/dL (1-2 hours) ?     Critically ill patients:  140 - 180 mg/dL  ? ?Lab Results  ?Component Value Date  ? GLUCAP 77 10/03/2014  ? HGBA1C 10.1 (H) 01/15/2022  ? ? ?Review of Glycemic Control ? Latest Reference Range & Units 01/15/22 22:20  ?Glucose 70 - 99 mg/dL 431 (H)  ?(H): Data is abnormally high ? ?Diabetes history: DM2 ? ?Outpatient Diabetes medications: Metformin 1000 mg BID, Glipizide 5 mg BID ? ?Current orders for Inpatient glycemic control: Metformin 1000 mg BID, Glipizide 5 mg BID ? ?Inpatient Diabetes Program Recommendations:   ? ?Please check cbg's ac/hs.  Serum glucose was 296 mg/dL on 5/40/08.  A1C is 10.1% (average blood sugar of 243 mg/dL).  If cbg's consistently elevated and appropriate please consider Novolog 0-9 units TID and 0-5 units QHS.   ? ?Will continue to follow while inpatient. ? ?Thank you, ?Dulce Sellar, MSN, RN ?Diabetes Coordinator ?Inpatient Diabetes Program ?[phone] (team pager from 8a-5p) ? ? ? ?

## 2022-01-18 NOTE — Medical Student Note (Signed)
Behavioral Health Progress Note ? ?Date and Time: 01/18/2022 1:13 PM ?Name: Elizabeth [name] ?MRN:  [id number] ? ?Subjective:   ?Elizabeth [name] is a 51 y/o single female with a history of MDD, schizoaffective d/o and GAD presented voluntarily to [facility] for suicidal ideations with a plan to take all of her pills on 01/15/22. She was admitted to the [facility] for crisis stabilization and continued on home medications. UDS negative. ?  ?Patient seen and chart reviewed. She has been medication compliant and has been appropriate with staff and peers on the unit. Patient reports that her mood is "sad and tired" today. She rates her anxiety 6/10. She is anxious about her mood fluctuations and suicidality outside of the hospital. She is also anxious about returning to work. She is worried about her housing situation as well. Patient had received an eviction warning due to nonpayment of rent. She reports that her friend was willing to let her stay in their guest room if she is evicted. Discussed that patient might contact her landlord to confirm her eviction to ease some anxiety. ? ?Patient reports she has a history of tremors in her left arm and mouth tremors. She reports that the tremors in her arm returned last night and they are not as significant today. She reports her mouth tremors are about the same. Patient reports she had diarrhea last night. Patient endorses continued congestion. She usually takes Mucinex and we discussed restarting it. Discussed patient's A1C of 10.2. Patient reports A1C decreased from 11.8. She was managing diabetes well with diet and exercise but worsened when she became sedentary due to a knee injury. Patient sees Kimber Relic, NP at [facility]. Discussed following-up with NP after discharge for diabetes management. ? ?Patient continues to express SI: "go to sleep and go away peacefully." However, these thoughts are less intense and less frequent than prior to admission. She denies HI and AVH. She  reports less paranoia and continued intrusive thoughts about self-esteem. She slept okay last night, 5-6 hours. Her appetite is good. Patient met with SW this morning to discuss next steps of care: PHP vs inpatient vs outpatient management with close follow-up from [facility] peer support specialist. Patient is employed 9am-1pm 2-3 days a week and is considering options that will work well with her schedule. ? ? ?Diagnosis:  ?Final diagnoses:  ?Schizoaffective disorder, depressive type ([place])  ?GAD (generalized anxiety disorder)  ? ? ?Total Time spent with patient: 20 minutes ? ?Past Psychiatric History:  ?Schizoaffective Disorder, GAD, Panic Disorder with Agoraphobia, MDD ? ?Past Medical History:  ?Past Medical History:  ?Diagnosis Date  ? Allergy   ? Anxiety   ? Bipolar depression ([place])   ? Diabetes mellitus without complication ([place])   ? Hypertension   ? No past surgical history on file. ?Family History:  ?Family History  ?Problem Relation Age of Onset  ? Arthritis Mother   ? Hypertension Mother   ? Heart disease Mother   ? Cancer Mother   ?     Skin cancer  ? Hyperlipidemia Mother   ? Stroke Mother   ? Arthritis Father   ?     rheumatoid  ? Hypertension Father   ? Cancer Father   ?     Skin cancer  ? Hypertension Sister   ? Thyroid disease Sister   ? Mental illness Sister   ? Stroke Maternal Grandmother   ? Congestive Heart Failure Maternal Grandfather   ? Diabetes Maternal Grandfather   ?  Heart disease Maternal Grandfather   ? Hyperlipidemia Maternal Grandfather   ? Hypertension Maternal Grandfather   ? Cancer Paternal Grandmother   ? Heart disease Paternal Grandfather   ? Diabetes Paternal Grandfather   ? Hyperlipidemia Paternal Grandfather   ? ?Family Psychiatric  History:  ?Sister with history of mental illness and suicide attempt ? ?Social History:  ?Social History  ? ?Substance and Sexual Activity  ?Alcohol Use No  ?   ?Social History  ? ?Substance and Sexual Activity  ?Drug Use No  ?  ?Social History   ? ?Socioeconomic History  ? Marital status: Single  ?  Spouse name: N/A  ? Number of children: 0  ? Years of education: Not on file  ? Highest education level: Not on file  ?Occupational History  ? Occupation: cashier-part time  ?  Comment: [employer]  ?  Employer: [employer]  ? Occupation: student  ?  Comment: medical office administration [employer]  ?Tobacco Use  ? Smoking status: Never  ? Smokeless tobacco: Never  ?Vaping Use  ? Vaping Use: Never used  ?Substance and Sexual Activity  ? Alcohol use: No  ? Drug use: No  ? Sexual activity: Not Currently  ?  Birth control/protection: Abstinence  ?  Comment: number of sex partners in the last 51 months  0  ?Other Topics Concern  ? Not on file  ?Social History Narrative  ? Lives alone. Sister lives in [place].  ? ?Social Determinants of Health  ? ?Financial Resource Strain: Not on file  ?Food Insecurity: Not on file  ?Transportation Needs: Not on file  ?Physical Activity: Not on file  ?Stress: Not on file  ?Social Connections: Not on file  ? ?SDOH:  ?SDOH Screenings  ? ?Alcohol Screen: Not on file  ?Depression (PHQ2-9): Not on file  ?Financial Resource Strain: Not on file  ?Food Insecurity: Not on file  ?Housing: Not on file  ?Physical Activity: Not on file  ?Social Connections: Not on file  ?Stress: Not on file  ?Tobacco Use: Low Risk   ? Smoking Tobacco Use: Never  ? Smokeless Tobacco Use: Never  ? Passive Exposure: Not on file  ?Transportation Needs: Not on file  ? ?Additional Social History:  ?  ?Pain Medications: Denies abuse ?Prescriptions: Denies abuse ?Over the Counter: Denies abuse ?History of alcohol / drug use?: No history of alcohol / drug abuse ?Longest period of sobriety (when/how long): NA ?  ?  ?  ?  ?  ?  ?  ?  ?  ? ?Sleep: Good ? ?Appetite:  Good ? ?Current Medications:  ?Current Facility-Administered Medications  ?Medication Dose Route Frequency Provider Last Rate Last Admin  ? acetaminophen (TYLENOL) tablet 650 mg  650 mg Oral Q6H PRN Bobbitt, Shalon  E, NP      ? alum & mag hydroxide-simeth (MAALOX/MYLANTA) 200-200-20 MG/5ML suspension 30 mL  30 mL Oral Q4H PRN Bobbitt, Shalon E, NP      ? atorvastatin (LIPITOR) tablet 10 mg  10 mg Oral Daily Bobbitt, Shalon E, NP   10 mg at 01/18/22 1012  ? B-complex with vitamin C tablet 1 tablet  1 tablet Oral Daily Bobbitt, Shalon E, NP   1 tablet at 01/18/22 1012  ? dextromethorphan-guaiFENesin (MUCINEX DM) 30-600 MG per 12 hr tablet 1 tablet  1 tablet Oral BID Ival Bible, MD      ? glipiZIDE (GLUCOTROL) tablet 5 mg  5 mg Oral BID AC Bobbitt, Shalon E, NP   5  mg at 01/18/22 4835  ? hydrochlorothiazide (HYDRODIURIL) tablet 25 mg  25 mg Oral Daily Bobbitt, Shalon E, NP   25 mg at 01/18/22 1012  ? hydrOXYzine (ATARAX) tablet 25 mg  25 mg Oral TID PRN Bobbitt, Shalon E, NP      ? lisinopril (ZESTRIL) tablet 20 mg  20 mg Oral Daily Bobbitt, Shalon E, NP   20 mg at 01/18/22 1012  ? lithium carbonate capsule 300 mg  300 mg Oral Daily Bobbitt, Shalon E, NP   300 mg at 01/18/22 1012  ? loratadine (CLARITIN) tablet 10 mg  10 mg Oral Daily Bobbitt, Shalon E, NP   10 mg at 01/18/22 1012  ? magnesium hydroxide (MILK OF MAGNESIA) suspension 30 mL  30 mL Oral Daily PRN Bobbitt, Shalon E, NP      ? metFORMIN (GLUCOPHAGE) tablet 1,000 mg  1,000 mg Oral BID WC Bobbitt, Shalon E, NP   1,000 mg at 01/18/22 0757  ? metoprolol succinate (TOPROL-XL) 24 hr tablet 50 mg  50 mg Oral QPM Bobbitt, Shalon E, NP   50 mg at 01/17/22 1733  ? multivitamin with minerals tablet 1 tablet  1 tablet Oral Daily Bobbitt, Shalon E, NP   1 tablet at 01/18/22 1012  ? PARoxetine (PAXIL) tablet 40 mg  40 mg Oral Daily Bobbitt, Shalon E, NP   40 mg at 01/18/22 1011  ? risperiDONE (RISPERDAL) tablet 6 mg  6 mg Oral QPM Ival Bible, MD   6 mg at 01/17/22 1732  ? trihexyphenidyl (ARTANE) tablet 2 mg  2 mg Oral q morning Ival Bible, MD      ? Derrill Memo ON 01/19/2022] vitamin E capsule 400 Units  400 Units Oral Daily Ival Bible, MD       ? ?Current Outpatient Medications  ?Medication Sig Dispense Refill  ? acetaminophen (TYLENOL) 500 MG tablet Take 500 mg by mouth every 6 (six) hours as needed for headache.    ? atorvastatin (LIPITOR) 10 MG tablet T

## 2022-01-18 NOTE — Progress Notes (Signed)
Patient ate lunch and returned to room and bed.  She is calm, quiet and without complaint or distress.  Denies avh shi or plan at this time.  She is easily approachable and conversant.  Will monitor and provide safe environment.   ?

## 2022-01-18 NOTE — ED Provider Notes (Signed)
Behavioral Health Progress Note ? ?Date and Time: 01/18/2022 3:18 PM ?Name: Elizabeth [name] ?MRN:  [id number] ? ?Subjective:   ? ?Original note by student Doctor Leonette Nutting MS3; edited by me as appropriate for completeness and accuracy.  ? ? ?Elizabeth [name] is a 51 y/o single female with a history of MDD , schizoaffective d/o and GAD presented voluntarily to [facility] for suicidal ideations with a plan to take all of her pills on 01/15/22. She was admitted to the [facility] for crisis stabilization and continued on home medications. UDS negative ? ?Patient seen and chart review- she has been medication compliant and has been appropriate with staff and peers on the unit. Patient is interviewed in her room where she is found laying in bed in NAD. She describes her mood as "not as good as yesterday".  She goes on to state that yesterday was "the best day in a long time" and describes enjoying spending times with others in the groups. She describes cognitive distortions including catastrophizing, jumping to conclusions and disqualifying the positive. She discusses uncertainty regarding her housing as she is unsure if her landlord has given her an eviction notice but reports that she has already spoken to a friend is able to stay with her in the short term if necessary. She denies HI/AVH. When asked about SI she states that she has had a "few thoughts" and alludes to overdosing on medication but immediately states that since she is in a controlled environment she cant do this. Discussed with patient that she may need a higher level of care if she continues to have minimal improvement in SI. Patient verbalizes understanding and states that her congestion may be contributing to her worse mood and suicidal thoughts and requests mucinex. She reports sleeping well and denies issues with appetite.  ? ?She states that she would like to participate in a PHP/IOP program with hours that fit better with her work schedule. Discussed with patient  that will discuss with LCSW and recommended that if a program is unable to meet her requested hour requirements that she may need to consider meeting with her peer support specialist multiple times a week- she states that she currently meets with her 2 days a week and can utilize her services for up to 5 hours a week. Discussed elevated a1c- she states that her a1c was previously 11 and then it has decreased from where it was with diet and exercise, she states that she sees an NP who manages her medication. Recommended that patient follow up with her after discharge for continued management. Patient verbalized understanding.  ? ?Per medical student interview: ?Patient seen and chart reviewed. She has been medication compliant and has been appropriate with staff and peers on the unit. Patient reports that her mood is "sad and tired" today. She rates her anxiety 6/10. She is anxious about her mood fluctuations and suicidality outside of the hospital. She is also anxious about returning to work. She is worried about her housing situation as well. Patient had received an eviction warning due to nonpayment of rent. She reports that her friend was willing to let her stay in their guest room if she is evicted. Discussed that patient might contact her landlord to confirm her eviction to ease some anxiety. ?  ?Patient reports she has a history of tremors in her left arm and mouth tremors. She reports that the tremors in her arm returned last night and they are not as significant today. She  reports her mouth tremors are about the same. Patient reports she had diarrhea last night. Patient endorses continued congestion. She usually takes Mucinex and we discussed restarting it. Discussed patient's A1C of 10.2. Patient reports A1C decreased from 11.8. She was managing diabetes well with diet and exercise but worsened when she became sedentary due to a knee injury. Patient sees Kimber Relic, NP at [facility]. Discussed  following-up with NP after discharge for diabetes management. ?  ?Patient continues to express SI: "go to sleep and go away peacefully." However, these thoughts are less intense and less frequent than prior to admission. She denies HI and AVH. She reports less paranoia and continued intrusive thoughts about self-esteem. She slept okay last night, 5-6 hours. Her appetite is good. Patient met with SW this morning to discuss next steps of care: PHP vs inpatient vs outpatient management with close follow-up from [facility] peer support specialist. Patient is employed 9am-1pm 2-3 days a week and is considering options that will work well with her schedule. ?Diagnosis:  ?Final diagnoses:  ?Schizoaffective disorder, depressive type ([place])  ?GAD (generalized anxiety disorder)  ? ? ?Total Time spent with patient: 30 minutes ? ?Past Psychiatric History: SAD, bipolar type; GAD; panic disorder ?Past Medical History:  ?Past Medical History:  ?Diagnosis Date  ? Allergy   ? Anxiety   ? Bipolar depression ([place])   ? Diabetes mellitus without complication ([place])   ? Hypertension   ? No past surgical history on file. ?Family History:  ?Family History  ?Problem Relation Age of Onset  ? Arthritis Mother   ? Hypertension Mother   ? Heart disease Mother   ? Cancer Mother   ?     Skin cancer  ? Hyperlipidemia Mother   ? Stroke Mother   ? Arthritis Father   ?     rheumatoid  ? Hypertension Father   ? Cancer Father   ?     Skin cancer  ? Hypertension Sister   ? Thyroid disease Sister   ? Mental illness Sister   ? Stroke Maternal Grandmother   ? Congestive Heart Failure Maternal Grandfather   ? Diabetes Maternal Grandfather   ? Heart disease Maternal Grandfather   ? Hyperlipidemia Maternal Grandfather   ? Hypertension Maternal Grandfather   ? Cancer Paternal Grandmother   ? Heart disease Paternal Grandfather   ? Diabetes Paternal Grandfather   ? Hyperlipidemia Paternal Grandfather   ? ?Family Psychiatric  History: per chart review sister has h/o  mental illness and has had a SA in the past ?Social History:  ?Social History  ? ?Substance and Sexual Activity  ?Alcohol Use No  ?   ?Social History  ? ?Substance and Sexual Activity  ?Drug Use No  ?  ?Social History  ? ?Socioeconomic History  ? Marital status: Single  ?  Spouse name: N/A  ? Number of children: 0  ? Years of education: Not on file  ? Highest education level: Not on file  ?Occupational History  ? Occupation: cashier-part time  ?  Comment: [employer]  ?  Employer: [employer]  ? Occupation: student  ?  Comment: medical office administration [employer]  ?Tobacco Use  ? Smoking status: Never  ? Smokeless tobacco: Never  ?Vaping Use  ? Vaping Use: Never used  ?Substance and Sexual Activity  ? Alcohol use: No  ? Drug use: No  ? Sexual activity: Not Currently  ?  Birth control/protection: Abstinence  ?  Comment: number of sex partners in the  last 12 months  0  ?Other Topics Concern  ? Not on file  ?Social History Narrative  ? Lives alone. Sister lives in [place].  ? ?Social Determinants of Health  ? ?Financial Resource Strain: Not on file  ?Food Insecurity: Not on file  ?Transportation Needs: Not on file  ?Physical Activity: Not on file  ?Stress: Not on file  ?Social Connections: Not on file  ? ?SDOH:  ?SDOH Screenings  ? ?Alcohol Screen: Not on file  ?Depression (PHQ2-9): Not on file  ?Financial Resource Strain: Not on file  ?Food Insecurity: Not on file  ?Housing: Not on file  ?Physical Activity: Not on file  ?Social Connections: Not on file  ?Stress: Not on file  ?Tobacco Use: Low Risk   ? Smoking Tobacco Use: Never  ? Smokeless Tobacco Use: Never  ? Passive Exposure: Not on file  ?Transportation Needs: Not on file  ? ?Additional Social History:  ?  ?Pain Medications: Denies abuse ?Prescriptions: Denies abuse ?Over the Counter: Denies abuse ?History of alcohol / drug use?: No history of alcohol / drug abuse ?Longest period of sobriety (when/how long): NA ?  ?  ?  ?  ?  ?  ?  ?  ?  ? ?Sleep:  Fair ? ?Appetite:  Fair ? ?Current Medications:  ?Current Facility-Administered Medications  ?Medication Dose Route Frequency Provider Last Rate Last Admin  ? acetaminophen (TYLENOL) tablet 650 mg  650 mg Oral Q6H PRN Bobbitt,

## 2022-01-19 DIAGNOSIS — R45851 Suicidal ideations: Secondary | ICD-10-CM | POA: Diagnosis not present

## 2022-01-19 DIAGNOSIS — F251 Schizoaffective disorder, depressive type: Secondary | ICD-10-CM | POA: Diagnosis not present

## 2022-01-19 DIAGNOSIS — Z20822 Contact with and (suspected) exposure to covid-19: Secondary | ICD-10-CM | POA: Diagnosis not present

## 2022-01-19 DIAGNOSIS — F411 Generalized anxiety disorder: Secondary | ICD-10-CM | POA: Diagnosis not present

## 2022-01-19 MED ORDER — DM-GUAIFENESIN ER 30-600 MG PO TB12: 1.0000 | ORAL_TABLET | Freq: Two times a day (BID) | ORAL | 0 refills | Status: AC

## 2022-01-19 NOTE — Clinical Social Work Psych Note (Signed)
LCSW Discharge/Update Note  ? ?Elizabeth [name] presented with a euthymic affect, congruent mood. She denied having any SI, HI or AVH at this time.  ? ?Elizabeth [name] expressed that she felt "100% better" than she did yesterday. Elizabeth [name] reports she is agreeable with discharging today, however she wanted to determine the status of her apartment.  ? ?Elizabeth [name] will continue to follow up with Dr. Jennelle Human at [facility] for medication management services. Elizabeth [name] denied any referrals for intensive outpatient or partial hospitalization therapy services at this time due to her concerns with her work schedule.  ? ?Elizabeth [name] requested to retrieve her landlord's contact from her phone, in order to determine whether or not she can return to her apartment. Elizabeth [name] shared that she does have a "great" support system and that she has a friend who she can possibly stay with in the event she cannot return to her apartment.  ? ?LCSW will continue to follow.  ? ?Baldo Daub, MSW, LCSW ?Clinical Child psychotherapist Hydrographic surveyor) ?[facility]  ? ?

## 2022-01-19 NOTE — ED Notes (Signed)
Pt resting with eyes closed in no acute distress. RR even and unlabored. Safety maintained. 

## 2022-01-19 NOTE — ED Notes (Signed)
Pt sleeping in no acute distress. RR even and unlabored. Safety maintained. 

## 2022-01-19 NOTE — ED Notes (Signed)
Patient A&Ox4. Denies intent to harm self/others when asked. Denies A/VH. Patient denies any physical complaints when asked. No acute distress noted. Pt states, "I feel a lot better than I did yesterday. I think my sinuses played a huge part in how I was feeling yesterday". Support and encouragement provided. Routine safety checks conducted according to facility protocol. Encouraged patient to notify staff if thoughts of harm toward self or others arise. Patient verbalize understanding and agreement. Will continue to monitor for safety.  ?   ?

## 2022-01-19 NOTE — Progress Notes (Signed)
Inpatient Diabetes Program Recommendations ? ?AACE/ADA: New Consensus Statement on Inpatient Glycemic Control (2015) ? ?Target Ranges:  Prepandial:   less than 140 mg/dL ?     Peak postprandial:   less than 180 mg/dL (1-2 hours) ?     Critically ill patients:  140 - 180 mg/dL  ? ?Lab Results  ?Component Value Date  ? GLUCAP 77 10/03/2014  ? HGBA1C 10.1 (H) 01/15/2022  ? ? ?Review of Glycemic Control ? ?Diabetes history: DM2 ?  ?Outpatient Diabetes medications: Metformin 1000 mg BID, Glipizide 5 mg BID ?  ?Current orders for Inpatient glycemic control: Metformin 1000 mg BID, Glipizide 5 mg BID ?  ? ?Inpatient Diabetes Program Recommendations:   ?  ?Please check cbg's ac/hs.  Serum glucose was 296 mg/dL on 4/97/02.  A1C is 10.1% (average blood sugar of 243 mg/dL).  If cbg's consistently elevated and appropriate please consider Novolog 0-9 units TID and 0-5 units QHS.  Ordered LWWD.   ? ?Will continue to follow while inpatient. ? ?Thank you, ?Dulce Sellar, MSN, RN ?Diabetes Coordinator ?Inpatient Diabetes Program ?[phone] (team pager from 8a-5p) ? ? ?

## 2022-01-19 NOTE — Discharge Instructions (Signed)

## 2022-01-19 NOTE — ED Provider Notes (Signed)
FBC/OBS ASAP Discharge Summary ? ?Date and Time: 01/19/2022 2:45 PM  ?Name: Elizabeth [name]  ?MRN:  [id number]  ? ?Discharge Diagnoses:  ?Final diagnoses:  ?Schizoaffective disorder, depressive type (HCC)  ?GAD (generalized anxiety disorder)  ? ? ?Subjective:  ?Patient seen and chart reviewed. Patient is interviewed in conjunction with LCSW this AM. She denies SI/HI/AVH. She describes her mood as "I feel 100% better than I did"; affect is bright and full. She states that she slept well and denies issues with appetite. She reports ongoing congestion but indicates that it is improving. She expresses that improvement in congestion has also impacted her mood positively. No other physical complaints this AM. Patient continues to report that she is unsure if she will be evicted or not. She expresses that she would be able to reside with a friend upon discharge if she is evicted. She reports  ? ?Stay Summary:  ?Elizabeth [name] is a 51 y/o single female with a history of MDD , schizoaffective d/o and GAD presented voluntarily to [facility] for suicidal ideations with a plan to take all of her pills on 01/15/22. She was admitted to the [facility] for crisis stabilization and continued on home medications. UDS negative. Patient was appropriate with staff and peers on the unit throughout her stay and was medication compliant. Adjustments were not made to her home medications as her mood improved significantly with resumption of home medications. Of note, patient had numerous cognitive disotortions that were likely contributing to presentation. Patient would likely benefit from counseling-referral was made at dc by LCSW and/or meetign with peer support specialist more frequently.  ? ?Total Time spent with patient: 30 minutes ? ?Past Psychiatric History: SAD, bipolar type; GAD; panic disorder ?Past Medical History:  ?Past Medical History:  ?Diagnosis Date  ? Allergy   ? Anxiety   ? Bipolar depression (HCC)   ? Diabetes mellitus without  complication (HCC)   ? Hypertension   ? No past surgical history on file. ?Family History:  ?Family History  ?Problem Relation Age of Onset  ? Arthritis Mother   ? Hypertension Mother   ? Heart disease Mother   ? Cancer Mother   ?     Skin cancer  ? Hyperlipidemia Mother   ? Stroke Mother   ? Arthritis Father   ?     rheumatoid  ? Hypertension Father   ? Cancer Father   ?     Skin cancer  ? Hypertension Sister   ? Thyroid disease Sister   ? Mental illness Sister   ? Stroke Maternal Grandmother   ? Congestive Heart Failure Maternal Grandfather   ? Diabetes Maternal Grandfather   ? Heart disease Maternal Grandfather   ? Hyperlipidemia Maternal Grandfather   ? Hypertension Maternal Grandfather   ? Cancer Paternal Grandmother   ? Heart disease Paternal Grandfather   ? Diabetes Paternal Grandfather   ? Hyperlipidemia Paternal Grandfather   ? ?Family Psychiatric History: per chart review sister has h/o mental illness and has had a SA in the past ?Social History:  ?Social History  ? ?Substance and Sexual Activity  ?Alcohol Use No  ?   ?Social History  ? ?Substance and Sexual Activity  ?Drug Use No  ?  ?Social History  ? ?Socioeconomic History  ? Marital status: Single  ?  Spouse name: N/A  ? Number of children: 0  ? Years of education: Not on file  ? Highest education level: Not on file  ?Occupational History  ?  Occupation: cashier-part time  ?  Comment: [employer]  ?  Employer: [employer]  ? Occupation: student  ?  Comment: medical office administration [employer]  ?Tobacco Use  ? Smoking status: Never  ? Smokeless tobacco: Never  ?Vaping Use  ? Vaping Use: Never used  ?Substance and Sexual Activity  ? Alcohol use: No  ? Drug use: No  ? Sexual activity: Not Currently  ?  Birth control/protection: Abstinence  ?  Comment: number of sex partners in the last 12 months  0  ?Other Topics Concern  ? Not on file  ?Social History Narrative  ? Lives alone. Sister lives in [place].  ? ?Social Determinants of Health  ? ?Financial  Resource Strain: Not on file  ?Food Insecurity: Not on file  ?Transportation Needs: Not on file  ?Physical Activity: Not on file  ?Stress: Not on file  ?Social Connections: Not on file  ? ?SDOH:  ?SDOH Screenings  ? ?Alcohol Screen: Not on file  ?Depression (PHQ2-9): Not on file  ?Financial Resource Strain: Not on file  ?Food Insecurity: Not on file  ?Housing: Not on file  ?Physical Activity: Not on file  ?Social Connections: Not on file  ?Stress: Not on file  ?Tobacco Use: Low Risk   ? Smoking Tobacco Use: Never  ? Smokeless Tobacco Use: Never  ? Passive Exposure: Not on file  ?Transportation Needs: Not on file  ? ? ?Tobacco Cessation:  N/A, patient does not currently use tobacco products ? ?Current Medications:  ?Current Facility-Administered Medications  ?Medication Dose Route Frequency Provider Last Rate Last Admin  ? acetaminophen (TYLENOL) tablet 650 mg  650 mg Oral Q6H PRN Bobbitt, Shalon E, NP      ? alum & mag hydroxide-simeth (MAALOX/MYLANTA) 200-200-20 MG/5ML suspension 30 mL  30 mL Oral Q4H PRN Bobbitt, Shalon E, NP      ? atorvastatin (LIPITOR) tablet 10 mg  10 mg Oral Daily Bobbitt, Shalon E, NP   10 mg at 01/19/22 0916  ? B-complex with vitamin C tablet 1 tablet  1 tablet Oral Daily Bobbitt, Shalon E, NP   1 tablet at 01/19/22 0915  ? dextromethorphan-guaiFENesin (MUCINEX DM) 30-600 MG per 12 hr tablet 1 tablet  1 tablet Oral BID Estella Husk, MD   1 tablet at 01/19/22 0915  ? glipiZIDE (GLUCOTROL) tablet 5 mg  5 mg Oral BID AC Bobbitt, Shalon E, NP   5 mg at 01/19/22 0830  ? hydrochlorothiazide (HYDRODIURIL) tablet 25 mg  25 mg Oral Daily Bobbitt, Shalon E, NP   25 mg at 01/19/22 7124  ? hydrOXYzine (ATARAX) tablet 25 mg  25 mg Oral TID PRN Bobbitt, Shalon E, NP      ? lisinopril (ZESTRIL) tablet 20 mg  20 mg Oral Daily Bobbitt, Shalon E, NP   20 mg at 01/19/22 0916  ? lithium carbonate capsule 300 mg  300 mg Oral Daily Bobbitt, Shalon E, NP   300 mg at 01/19/22 0920  ? loratadine (CLARITIN)  tablet 10 mg  10 mg Oral Daily Bobbitt, Shalon E, NP   10 mg at 01/19/22 0915  ? magnesium hydroxide (MILK OF MAGNESIA) suspension 30 mL  30 mL Oral Daily PRN Bobbitt, Shalon E, NP      ? metFORMIN (GLUCOPHAGE) tablet 1,000 mg  1,000 mg Oral BID WC Bobbitt, Shalon E, NP   1,000 mg at 01/19/22 0830  ? metoprolol succinate (TOPROL-XL) 24 hr tablet 50 mg  50 mg Oral QPM Bobbitt, Franchot Mimes, NP  50 mg at 01/18/22 1705  ? multivitamin with minerals tablet 1 tablet  1 tablet Oral Daily Bobbitt, Shalon E, NP   1 tablet at 01/19/22 0915  ? PARoxetine (PAXIL) tablet 40 mg  40 mg Oral Daily Bobbitt, Shalon E, NP   40 mg at 01/19/22 0915  ? risperiDONE (RISPERDAL) tablet 6 mg  6 mg Oral QPM Estella HuskLaubach, Ollie Delano S, MD   6 mg at 01/18/22 1705  ? trihexyphenidyl (ARTANE) tablet 2 mg  2 mg Oral q morning Estella HuskLaubach, Artavia Jeanlouis S, MD   2 mg at 01/19/22 0915  ? vitamin E capsule 400 Units  400 Units Oral Daily Estella HuskLaubach, Bucky Grigg S, MD   400 Units at 01/19/22 0915  ? ?Current Outpatient Medications  ?Medication Sig Dispense Refill  ? acetaminophen (TYLENOL) 500 MG tablet Take 500 mg by mouth every 6 (six) hours as needed for headache.    ? atorvastatin (LIPITOR) 10 MG tablet Take 10 mg by mouth every evening. Takes with evening meal    ? Cholecalciferol 25 MCG (1000 UT) capsule Take 1,000 Units by mouth daily.    ? glipiZIDE (GLUCOTROL) 5 MG tablet Take by mouth 2 (two) times daily before a meal.    ? hydrochlorothiazide (HYDRODIURIL) 25 MG tablet Take 25 mg by mouth daily.    ? lisinopril (ZESTRIL) 40 MG tablet Take 40 mg by mouth daily.    ? lithium carbonate 300 MG capsule Take 300 mg by mouth daily.    ? loratadine (CLARITIN) 10 MG tablet Take 1 tablet (10 mg total) by mouth daily. 14 tablet 0  ? LORazepam (ATIVAN) 0.5 MG tablet Take 1-2 tablets (0.5-1 mg total) by mouth daily as needed for anxiety (as needed for anxiety before work). 30 tablet 1  ? metFORMIN (GLUCOPHAGE) 1000 MG tablet TAKE 1 TABLET BY MOUTH TWICE DAILY BEFORE MEAL(S)     ? metoprolol succinate (TOPROL-XL) 50 MG 24 hr tablet Take 50 mg by mouth at bedtime.    ? Multiple Vitamin (MULTIVITAMIN WITH MINERALS) TABS tablet Take 1 tablet by mouth daily.    ? PARoxetine (PAXIL) 20 MG tab

## 2022-01-19 NOTE — ED Notes (Signed)
Pt discharged with  AVS.  AVS reviewed prior to discharge.  Pt alert, oriented, and ambulatory.  Safety maintained.  °

## 2022-01-19 NOTE — ED Notes (Signed)
Pt is currently sleeping, no distress noted, environmental check complete, will continue to monitor patient for safety. ? ?

## 2022-01-21 NOTE — Telephone Encounter (Signed)
Please call patient

## 2022-01-21 NOTE — Telephone Encounter (Signed)
Rtc to pt and she reports going to [facility] on Saturday morning and they kept her until Wednesday. No medication changes but they did change some of the times she takes the medication. She is planning on attending IOP next week daily from 9 am-1 pm. Her work is aware and I told her if she needs a work note or anything completed to let us know. She does feel better and is thinking positive about the classes next week. ?Informed her I would let Dr. Clovis Pu know and he can review her notes from [facility]. Also told her if he needed to work her in to be seen we would let her know. She isn't scheduled now until 4/11. ?

## 2022-01-21 NOTE — Telephone Encounter (Signed)
Terrion called  this morning at 9:30am.  She did go the the [facility] due to suicidal thoughts.  They want her to complete an out patient program next week which she plans to do.  She made appt for 02/15/22 which was the first afternoon appt available.  She is on the CXL.Since she is doing the out pt program next week she won't be available until after she completes that program.  Call pt to review her stay at urgent care and upcoming program. ?

## 2022-01-24 ENCOUNTER — Telehealth (HOSPITAL_COMMUNITY): Payer: Self-pay | Admitting: Professional

## 2022-01-25 ENCOUNTER — Telehealth (HOSPITAL_COMMUNITY): Payer: No Payment, Other

## 2022-01-25 ENCOUNTER — Telehealth (HOSPITAL_COMMUNITY): Payer: Self-pay | Admitting: Licensed Clinical Social Worker

## 2022-01-25 DIAGNOSIS — F411 Generalized anxiety disorder: Secondary | ICD-10-CM | POA: Insufficient documentation

## 2022-01-25 DIAGNOSIS — F251 Schizoaffective disorder, depressive type: Secondary | ICD-10-CM

## 2022-01-25 DIAGNOSIS — F4001 Agoraphobia with panic disorder: Secondary | ICD-10-CM | POA: Insufficient documentation

## 2022-01-25 NOTE — Plan of Care (Signed)
Pt agrees to tx plan °

## 2022-01-25 NOTE — Plan of Care (Signed)
?  Problem: Depression CCP Problem  1 Learn and Apply Coping Skills to Decrease Depressive and Anxiety Symptoms   ?Goal: STG: [name] WILL ATTEND AT LEAST 80% OF SCHEDULED PHP SESSIONS ?Outcome: Not Applicable ?Goal: STG: [name] WILL ATTEND AT LEAST 80% OF SCHEDULED GROUP PSYCHOTHERAPY SESSIONS ?Outcome: Not Applicable ?Goal: STG: [name] WILL COMPLETE AT LEAST 80% OF ASSIGNED HOMEWORK ?Outcome: Not Applicable ?Goal: STG: [name] WILL IDENTIFY AT LEAST THREE COGNITIVE PATTERNS AND BELIEFS THAT SUPPORT DEPRESSION ?Outcome: Not Applicable ?  ?Pt agrees to tx plan. ?
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
## 2022-01-26 ENCOUNTER — Telehealth (HOSPITAL_COMMUNITY): Payer: Self-pay | Admitting: Licensed Clinical Social Worker

## 2022-01-26 ENCOUNTER — Ambulatory Visit: Payer: Self-pay | Admitting: Psychiatry

## 2022-01-27 ENCOUNTER — Ambulatory Visit (INDEPENDENT_AMBULATORY_CARE_PROVIDER_SITE_OTHER): Payer: No Payment, Other | Admitting: Clinical

## 2022-01-27 ENCOUNTER — Other Ambulatory Visit: Payer: Self-pay

## 2022-01-27 ENCOUNTER — Telehealth (HOSPITAL_COMMUNITY): Payer: Self-pay | Admitting: Licensed Clinical Social Worker

## 2022-01-27 DIAGNOSIS — F251 Schizoaffective disorder, depressive type: Secondary | ICD-10-CM | POA: Diagnosis not present

## 2022-01-27 NOTE — Progress Notes (Signed)
? ?  THERAPIST PROGRESS NOTE ? ?Session Time: 30 minutes ? ?Participation Level: Active ? ?Behavioral Response: CasualAlertEuthymic ? ?Type of Therapy: Individual Therapy ? ?Treatment Goals addressed: follow up care/ medication compliance ? ?ProgressTowards Goals: Initial ? ?Interventions: CBT and Supportive ? ?Summary:  ?Elizabeth [name] is a 51 y.o. female who presents for a initial follow up outpatient therapy appointment. Client presents by referral of the [facility] behavioral urgent care. Client reported she presented to the [facility] urgent care on 01/15/2022 due to anxiety and suicidal ideations. Client reported since discharge she has not had issues with depression and/or suicidal ideations. Client reported it takes her awhile to fall asleep but she stays asleep once she does. Client reported her appetite has improved and she is eating better portions. Client reported she is medication compliant. Client reported her medications are being managed by [facility]. Client reported she has other support services in place including weekly peer support from [facility]. Client reported she has support from her family and others at her church and work. 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  ? ? ?Suicidal/Homicidal: Nowithout intent/plan ? ?Therapist Response:  ?Therapist began the appointment making introduction and discussing confidentiality.  ?Therapist used CBT to ask the client about reasons for presentation tot he urgent care and how she has been doing since discharge. ?Therapist used CBT to ask the client about medication compliance compared to ongoing symptoms.  ?Therapist used CBT to ask the client about established outpatient mental health services she is connected to. ?Therapist discussed the order of follow up care for the client to engage with. ? ? ?Plan: Client will complete [facility] PHP program.  ? ?Diagnosis: schizoaffective  disorder, depressive type ? ?Collaboration of Care: Referral or follow-up with counselor/therapist AEB Therapist instructed the client to complete [facility] partial hospitalization group. Therapist informed the client once she has completed the group to return to her psychiatry provider to inquire about being connected with an outpatient therapist in that office. Client reported she will also attend weekly MH groups via [facility] in [place] where she lives. ? ?Patient/Guardian was advised Release of Information must be obtained prior to any record release in order to collaborate their care with an outside provider. Patient/Guardian was advised if they have not already done so to contact the registration department to sign all necessary forms in order for [place] to release information regarding their care.  ? ?Consent: Patient/Guardian gives verbal consent for treatment and assignment of benefits for services provided during this visit. Patient/Guardian expressed understanding and agreed to proceed.  ? ?Neena Rhymes Deloros Beretta, LCSW ?01/27/2022 ? ?

## 2022-01-31 ENCOUNTER — Encounter: Payer: Self-pay | Admitting: Psychiatry

## 2022-02-01 ENCOUNTER — Telehealth (HOSPITAL_COMMUNITY): Payer: Self-pay

## 2022-02-01 NOTE — BH Assessment (Signed)
Care Management - [facility] Follow Up Discharges  ? ?Writer attempted to make contact with patient today and was unsuccessful.  Writer left a HIPPA compliant voice message.  ? ?Per chart review, patient will follow up with Dr. Jennelle Human at [facility] for medication management services.  Patient denied any referrals for intensive outpatient or partial hospitalization therapy services.  ?

## 2022-02-15 ENCOUNTER — Ambulatory Visit: Payer: Self-pay | Admitting: Psychiatry

## 2022-02-17 ENCOUNTER — Other Ambulatory Visit: Payer: Self-pay | Admitting: Psychiatry

## 2022-02-22 NOTE — Telephone Encounter (Signed)
I think she may have transitioned to a new psychiatrist closer to her based on prior conversations about seeking resources closer to her. ? ?

## 2022-02-24 ENCOUNTER — Other Ambulatory Visit: Payer: Self-pay | Admitting: Psychiatry

## 2022-02-24 DIAGNOSIS — F4001 Agoraphobia with panic disorder: Secondary | ICD-10-CM

## 2022-02-24 DIAGNOSIS — F411 Generalized anxiety disorder: Secondary | ICD-10-CM

## 2022-03-14 ENCOUNTER — Ambulatory Visit (INDEPENDENT_AMBULATORY_CARE_PROVIDER_SITE_OTHER): Payer: Self-pay | Admitting: Psychiatry

## 2022-03-14 ENCOUNTER — Encounter: Payer: Self-pay | Admitting: Psychiatry

## 2022-03-14 DIAGNOSIS — F4001 Agoraphobia with panic disorder: Secondary | ICD-10-CM

## 2022-03-14 DIAGNOSIS — F3281 Premenstrual dysphoric disorder: Secondary | ICD-10-CM

## 2022-03-14 DIAGNOSIS — G251 Drug-induced tremor: Secondary | ICD-10-CM

## 2022-03-14 DIAGNOSIS — F251 Schizoaffective disorder, depressive type: Secondary | ICD-10-CM

## 2022-03-14 DIAGNOSIS — F411 Generalized anxiety disorder: Secondary | ICD-10-CM
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
## 2022-04-05 ENCOUNTER — Telehealth: Payer: Self-pay | Admitting: Psychiatry

## 2022-04-05 NOTE — Telephone Encounter (Signed)
Pt called reporting started hearing voices to hurt herself again past couple of weeks off and on. Not sure what triggered it. Slight med change while back. Call Pt [phone] Apt 6/8

## 2022-04-05 NOTE — Telephone Encounter (Signed)
Have her increase the risperidone from 6 mg nightly to 8 mg taking 2 of the 4 mg tablets for the voices.

## 2022-04-05 NOTE — Telephone Encounter (Signed)
LVM per DPR with the instructions to increase Risperdal to 8 mg.

## 2022-04-05 NOTE — Telephone Encounter (Signed)
Rtc to pt and she reports hearing voices X 4 months off and on, when she was seen on the 8th, she reports they were in the back of her mind but she could control them. Now they are loud and taking over more, she contracts for safety she has no thoughts of hurting herself and knows she doesn't want to hurt herself. She reports the voices may see it differently. She feels like it may be spiritual things she is going through but she's not sure. She just wants the voices to go away.   Informed pt I would discuss with Dr. Jennelle Human and give her a call back. She is currently at work now.

## 2022-04-11 ENCOUNTER — Other Ambulatory Visit: Payer: Self-pay | Admitting: Psychiatry

## 2022-04-11 MED ORDER — RISPERIDONE 4 MG PO TABS: 8.0000 mg | ORAL_TABLET | Freq: Every evening | ORAL | 1 refills | Status: DC

## 2022-04-11 NOTE — Telephone Encounter (Signed)
FYI, Followed up with pt and she reports the increase in Risperdal 8 mg has helped her voices, no side effects but she is sleeping a bit longer in the morning. She has an apt on Thursday 04/14/22 so she will need an updated Rx that day.

## 2022-04-11 NOTE — Telephone Encounter (Signed)
I updated and renewed risperidone to 8 mg HS

## 2022-04-11 NOTE — Telephone Encounter (Signed)
Meant to take her high alert off

## 2022-04-14 ENCOUNTER — Ambulatory Visit: Payer: Self-pay | Admitting: Psychiatry

## 2022-04-24 ENCOUNTER — Other Ambulatory Visit: Payer: Self-pay | Admitting: Psychiatry

## 2022-04-24 DIAGNOSIS — F411 Generalized anxiety disorder: Secondary | ICD-10-CM

## 2022-04-24 DIAGNOSIS — F4001 Agoraphobia with panic disorder: Secondary | ICD-10-CM

## 2022-04-28 ENCOUNTER — Telehealth: Payer: Self-pay | Admitting: Psychiatry

## 2022-04-28 NOTE — Telephone Encounter (Signed)
Pt called reporting having med issues. Ask for RTC @ [phone] ASAP

## 2022-04-29 ENCOUNTER — Other Ambulatory Visit: Payer: Self-pay

## 2022-04-29 MED ORDER — DIVALPROEX SODIUM 500 MG PO DR TAB: 500.0000 mg | DELAYED_RELEASE_TABLET | Freq: Two times a day (BID) | ORAL | 0 refills | Status: DC

## 2022-04-29 NOTE — Telephone Encounter (Signed)
Patient notified of recommendations and Rx sent.

## 2022-05-03 ENCOUNTER — Telehealth: Payer: Self-pay | Admitting: Psychiatry

## 2022-05-03 NOTE — Telephone Encounter (Signed)
Elizabeth [name] called at 10:00am needing clarification on weaning off her risperidone and tapering onto the Depakote.  Please call to clarify directions.

## 2022-05-03 NOTE — Telephone Encounter (Signed)
Spoke to pt and clarified instructions

## 2022-05-22 ENCOUNTER — Other Ambulatory Visit: Payer: Self-pay | Admitting: Psychiatry

## 2022-05-30 ENCOUNTER — Ambulatory Visit: Payer: Self-pay | Admitting: Psychiatry

## 2022-06-24 ENCOUNTER — Other Ambulatory Visit: Payer: Self-pay | Admitting: Psychiatry

## 2022-07-06 ENCOUNTER — Telehealth: Payer: Self-pay | Admitting: Psychiatry

## 2022-07-06 NOTE — Telephone Encounter (Signed)
Pt would like to know if we have any samples of Divalproex.  She is in between jobs and can't afford to get it right now.  She would like to pick it up on Friday, but call her to let her know if there are samples available.  Next appt 9/19

## 2022-07-06 NOTE — Telephone Encounter (Deleted)
Called patient and told her that we did not have samples. Will ask about any resources available to help her.

## 2022-07-07 NOTE — Telephone Encounter (Signed)
This is Dr. Alwyn Ren pt.

## 2022-07-11 ENCOUNTER — Other Ambulatory Visit: Payer: Self-pay | Admitting: Psychiatry

## 2022-07-11 DIAGNOSIS — F411 Generalized anxiety disorder: Secondary | ICD-10-CM

## 2022-07-11 DIAGNOSIS — F4001 Agoraphobia with panic disorder: Secondary | ICD-10-CM

## 2022-07-25 ENCOUNTER — Other Ambulatory Visit: Payer: Self-pay | Admitting: Psychiatry

## 2022-07-26 ENCOUNTER — Encounter: Payer: Self-pay | Admitting: Psychiatry

## 2022-07-26 ENCOUNTER — Ambulatory Visit (INDEPENDENT_AMBULATORY_CARE_PROVIDER_SITE_OTHER): Payer: Self-pay | Admitting: Psychiatry

## 2022-07-26 DIAGNOSIS — F3281 Premenstrual dysphoric disorder: Secondary | ICD-10-CM

## 2022-07-26 DIAGNOSIS — F411 Generalized anxiety disorder: Secondary | ICD-10-CM

## 2022-07-26 DIAGNOSIS — F4001 Agoraphobia with panic disorder: Secondary | ICD-10-CM

## 2022-07-26 DIAGNOSIS — F251 Schizoaffective disorder, depressive type: Secondary | ICD-10-CM

## 2022-07-26 DIAGNOSIS — G251 Drug-induced tremor: Secondary | ICD-10-CM

## 2022-07-26 MED ORDER — TRIHEXYPHENIDYL HCL 2 MG PO TABS: 2.0000 mg | ORAL_TABLET | Freq: Two times a day (BID) | ORAL | 1 refills | Status: DC

## 2022-07-26 MED ORDER — LITHIUM CARBONATE 300 MG PO CAPS: 300.0000 mg | ORAL_CAPSULE | Freq: Every day | ORAL | 0 refills | Status: DC

## 2022-07-26 MED ORDER — PAROXETINE HCL 20 MG PO TABS: 40.0000 mg | ORAL_TABLET | Freq: Every day | ORAL | 3 refills | Status: DC

## 2022-07-26 MED ORDER — DIVALPROEX SODIUM 500 MG PO DR TAB: 500.0000 mg | DELAYED_RELEASE_TABLET | Freq: Two times a day (BID) | ORAL | 3 refills | Status: DC

## 2022-07-26 MED ORDER — RISPERIDONE 4 MG PO TABS: ORAL_TABLET | ORAL | 3 refills | Status: DC
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
## 2022-10-11 ENCOUNTER — Other Ambulatory Visit: Payer: Self-pay | Admitting: Psychiatry

## 2022-10-11 DIAGNOSIS — F251 Schizoaffective disorder, depressive type: Secondary | ICD-10-CM

## 2022-10-27 ENCOUNTER — Ambulatory Visit: Payer: Self-pay | Admitting: Psychiatry

## 2022-10-27 ENCOUNTER — Other Ambulatory Visit: Payer: Self-pay | Admitting: Psychiatry

## 2022-10-27 DIAGNOSIS — Z538 Procedure and treatment not carried out for other reasons: Secondary | ICD-10-CM

## 2022-10-27 DIAGNOSIS — F411 Generalized anxiety disorder: Secondary | ICD-10-CM

## 2022-10-27 DIAGNOSIS — F251 Schizoaffective disorder, depressive type: Secondary | ICD-10-CM

## 2022-10-28 MED ORDER — DIVALPROEX SODIUM 500 MG PO DR TAB: 500.0000 mg | DELAYED_RELEASE_TABLET | Freq: Two times a day (BID) | ORAL | 3 refills | Status: DC

## 2022-11-06 NOTE — Progress Notes (Signed)
Appt cancelled

## 2022-12-08 NOTE — Progress Notes (Signed)
Error

## 2022-12-20 ENCOUNTER — Other Ambulatory Visit: Payer: Self-pay | Admitting: Psychiatry

## 2022-12-20 DIAGNOSIS — F251 Schizoaffective disorder, depressive type: Secondary | ICD-10-CM

## 2022-12-21 NOTE — Telephone Encounter (Signed)
Verify dose?

## 2022-12-22 NOTE — Telephone Encounter (Signed)
LVM to RC 

## 2023-01-01 ENCOUNTER — Other Ambulatory Visit: Payer: Self-pay | Admitting: Psychiatry

## 2023-01-01 DIAGNOSIS — F411 Generalized anxiety disorder: Secondary | ICD-10-CM

## 2023-01-01 DIAGNOSIS — F4001 Agoraphobia with panic disorder: Secondary | ICD-10-CM

## 2023-01-24 ENCOUNTER — Telehealth: Payer: Medicaid Other | Admitting: Psychiatry

## 2023-02-18 ENCOUNTER — Other Ambulatory Visit: Payer: Self-pay | Admitting: Psychiatry

## 2023-02-18 DIAGNOSIS — F251 Schizoaffective disorder, depressive type: Secondary | ICD-10-CM

## 2023-02-19 ENCOUNTER — Other Ambulatory Visit: Payer: Self-pay | Admitting: Psychiatry

## 2023-02-19 DIAGNOSIS — F411 Generalized anxiety disorder: Secondary | ICD-10-CM

## 2023-02-19 DIAGNOSIS — F4001 Agoraphobia with panic disorder: Secondary | ICD-10-CM

## 2023-02-19 NOTE — Telephone Encounter (Signed)
Verify dose?

## 2023-03-06 ENCOUNTER — Telehealth (INDEPENDENT_AMBULATORY_CARE_PROVIDER_SITE_OTHER): Payer: Medicaid Other | Admitting: Psychiatry

## 2023-03-06 ENCOUNTER — Encounter: Payer: Self-pay | Admitting: Psychiatry

## 2023-03-06 DIAGNOSIS — G251 Drug-induced tremor: Secondary | ICD-10-CM

## 2023-03-06 DIAGNOSIS — F4001 Agoraphobia with panic disorder: Secondary | ICD-10-CM | POA: Diagnosis not present

## 2023-03-06 DIAGNOSIS — F3281 Premenstrual dysphoric disorder: Secondary | ICD-10-CM

## 2023-03-06 DIAGNOSIS — F251 Schizoaffective disorder, depressive type: Secondary | ICD-10-CM

## 2023-03-06 DIAGNOSIS — F411 Generalized anxiety disorder: Secondary | ICD-10-CM

## 2023-03-06 MED ORDER — DIVALPROEX SODIUM 500 MG PO DR TAB: 500.0000 mg | DELAYED_RELEASE_TABLET | Freq: Two times a day (BID) | ORAL | 0 refills | Status: DC

## 2023-03-06 MED ORDER — PAROXETINE HCL 20 MG PO TABS: 40.0000 mg | ORAL_TABLET | Freq: Every day | ORAL | 0 refills | Status: DC

## 2023-03-06 MED ORDER — TRIHEXYPHENIDYL HCL 2 MG PO TABS: 2.0000 mg | ORAL_TABLET | Freq: Two times a day (BID) | ORAL | 1 refills | Status: DC

## 2023-03-06 MED ORDER — RISPERIDONE 4 MG PO TABS: ORAL_TABLET | ORAL | 0 refills | Status: DC

## 2023-03-06 MED ORDER — LITHIUM CARBONATE 300 MG PO CAPS: 300.0000 mg | ORAL_CAPSULE | Freq: Every day | ORAL | 0 refills | Status: DC
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
## 2023-05-04 ENCOUNTER — Telehealth: Payer: Self-pay | Admitting: Psychiatry

## 2023-05-04 DIAGNOSIS — F251 Schizoaffective disorder, depressive type: Secondary | ICD-10-CM

## 2023-05-04 NOTE — Telephone Encounter (Signed)
Elizabeth [name] called at 4:30 to request refill of her Lithium. Made appt for 8/28.  [facility] to [facility] [address] in [place].  Dr. Jennelle Human had advised to try to find a Dr. In [facility] closer to her.  The Dr. She knew said she didn't it was a good idea because Dr. Jennelle Human knows her history.  She is trying to find someone else but has not been successful.

## 2023-05-05 MED ORDER — LITHIUM CARBONATE 300 MG PO CAPS
300.0000 mg | ORAL_CAPSULE | Freq: Every day | ORAL | 0 refills | Status: DC
Start: 2023-05-05 — End: 2023-07-05

## 2023-05-05 NOTE — Telephone Encounter (Signed)
Sent!

## 2023-07-05 ENCOUNTER — Encounter: Payer: Self-pay | Admitting: Psychiatry

## 2023-07-05 ENCOUNTER — Ambulatory Visit (INDEPENDENT_AMBULATORY_CARE_PROVIDER_SITE_OTHER): Payer: MEDICAID | Admitting: Psychiatry

## 2023-07-05 DIAGNOSIS — F251 Schizoaffective disorder, depressive type: Secondary | ICD-10-CM

## 2023-07-05 DIAGNOSIS — G251 Drug-induced tremor: Secondary | ICD-10-CM | POA: Diagnosis not present

## 2023-07-05 DIAGNOSIS — F3281 Premenstrual dysphoric disorder: Secondary | ICD-10-CM

## 2023-07-05 DIAGNOSIS — F4001 Agoraphobia with panic disorder: Secondary | ICD-10-CM | POA: Diagnosis not present

## 2023-07-05 DIAGNOSIS — F411 Generalized anxiety disorder: Secondary | ICD-10-CM | POA: Diagnosis not present

## 2023-07-05 MED ORDER — RISPERIDONE 4 MG PO TABS
ORAL_TABLET | ORAL | 1 refills | Status: AC
Start: 2023-07-05 — End: ?

## 2023-07-05 MED ORDER — PAROXETINE HCL 20 MG PO TABS
40.0000 mg | ORAL_TABLET | Freq: Every day | ORAL | 1 refills | Status: AC
Start: 2023-07-05 — End: ?

## 2023-07-05 MED ORDER — LORAZEPAM 0.5 MG PO TABS
0.5000 mg | ORAL_TABLET | Freq: Three times a day (TID) | ORAL | 0 refills | Status: AC | PRN
Start: 2023-07-05 — End: ?

## 2023-07-05 MED ORDER — LITHIUM CARBONATE 300 MG PO CAPS: 300.0000 mg | ORAL_CAPSULE | Freq: Every day | ORAL | 1 refills | Status: AC

## 2023-07-05 MED ORDER — DIVALPROEX SODIUM 500 MG PO DR TAB
500.0000 mg | DELAYED_RELEASE_TABLET | Freq: Two times a day (BID) | ORAL | 1 refills | Status: AC
Start: 2023-07-05 — End: ?

## 2023-07-05 MED ORDER — TRIHEXYPHENIDYL HCL 2 MG PO TABS: 2.0000 mg | ORAL_TABLET | Freq: Two times a day (BID) | ORAL | 1 refills | Status: AC
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
## 2023-09-20 ENCOUNTER — Encounter: Payer: Self-pay | Admitting: Psychiatry
